# Patient Record
Sex: Female | Born: 1950
Health system: Southern US, Community
[De-identification: ages and names within clinical notes are randomized; demographics above are authoritative.]

## PROBLEM LIST (undated history)

## (undated) DIAGNOSIS — E785 Hyperlipidemia, unspecified: Secondary | ICD-10-CM

## (undated) DIAGNOSIS — Z780 Asymptomatic menopausal state: Secondary | ICD-10-CM

## (undated) DIAGNOSIS — E538 Deficiency of other specified B group vitamins: Secondary | ICD-10-CM

## (undated) HISTORY — DX: Deficiency of other specified B group vitamins: E53.8

## (undated) HISTORY — DX: Hyperlipidemia, unspecified: E78.5

## (undated) HISTORY — PX: COLONOSCOPY: SHX174

## (undated) HISTORY — PX: DILATION AND CURETTAGE OF UTERUS: SHX78

## (undated) HISTORY — PX: ABDOMINAL HYSTERECTOMY: SHX81

## (undated) HISTORY — PX: CERVICAL CONE BIOPSY: SUR198

## (undated) HISTORY — PX: TUBAL LIGATION: SHX77

---

## 1998-10-21 ENCOUNTER — Other Ambulatory Visit: Admission: RE | Admit: 1998-10-21 | Discharge: 1998-10-21 | Payer: Self-pay | Admitting: *Deleted

## 1999-01-10 ENCOUNTER — Other Ambulatory Visit: Admission: RE | Admit: 1999-01-10 | Discharge: 1999-01-10 | Payer: Self-pay | Admitting: *Deleted

## 1999-11-13 ENCOUNTER — Other Ambulatory Visit: Admission: RE | Admit: 1999-11-13 | Discharge: 1999-11-13 | Payer: Self-pay | Admitting: *Deleted

## 2000-10-04 ENCOUNTER — Other Ambulatory Visit: Admission: RE | Admit: 2000-10-04 | Discharge: 2000-10-04 | Payer: Self-pay | Admitting: *Deleted

## 2001-09-26 ENCOUNTER — Other Ambulatory Visit: Admission: RE | Admit: 2001-09-26 | Discharge: 2001-09-26 | Payer: Self-pay | Admitting: *Deleted

## 2002-10-09 ENCOUNTER — Other Ambulatory Visit: Admission: RE | Admit: 2002-10-09 | Discharge: 2002-10-09 | Payer: Self-pay | Admitting: *Deleted

## 2003-10-17 ENCOUNTER — Other Ambulatory Visit: Admission: RE | Admit: 2003-10-17 | Discharge: 2003-10-17 | Payer: Self-pay | Admitting: *Deleted

## 2004-05-28 ENCOUNTER — Ambulatory Visit: Payer: Self-pay | Admitting: Family Medicine

## 2004-07-02 ENCOUNTER — Ambulatory Visit: Payer: Self-pay | Admitting: Internal Medicine

## 2004-07-17 ENCOUNTER — Ambulatory Visit: Payer: Self-pay | Admitting: Internal Medicine

## 2004-07-17 LAB — HM COLONOSCOPY

## 2004-08-05 ENCOUNTER — Ambulatory Visit: Payer: Self-pay | Admitting: Critical Care Medicine

## 2004-08-07 ENCOUNTER — Ambulatory Visit: Payer: Self-pay | Admitting: Critical Care Medicine

## 2004-08-07 ENCOUNTER — Encounter: Admission: RE | Admit: 2004-08-07 | Discharge: 2004-08-07 | Payer: Self-pay | Admitting: Critical Care Medicine

## 2004-09-22 ENCOUNTER — Ambulatory Visit: Payer: Self-pay | Admitting: Critical Care Medicine

## 2004-09-23 ENCOUNTER — Ambulatory Visit: Payer: Self-pay | Admitting: Family Medicine

## 2004-10-30 ENCOUNTER — Other Ambulatory Visit: Admission: RE | Admit: 2004-10-30 | Discharge: 2004-10-30 | Payer: Self-pay | Admitting: *Deleted

## 2005-11-04 ENCOUNTER — Other Ambulatory Visit: Admission: RE | Admit: 2005-11-04 | Discharge: 2005-11-04 | Payer: Self-pay | Admitting: *Deleted

## 2006-03-13 ENCOUNTER — Ambulatory Visit: Payer: Self-pay | Admitting: Family Medicine

## 2006-11-09 ENCOUNTER — Other Ambulatory Visit: Admission: RE | Admit: 2006-11-09 | Discharge: 2006-11-09 | Payer: Self-pay | Admitting: *Deleted

## 2007-12-19 ENCOUNTER — Other Ambulatory Visit: Admission: RE | Admit: 2007-12-19 | Discharge: 2007-12-19 | Payer: Self-pay | Admitting: Gynecology

## 2010-05-28 ENCOUNTER — Ambulatory Visit: Payer: Self-pay

## 2010-09-22 ENCOUNTER — Other Ambulatory Visit: Payer: Self-pay | Admitting: Gynecology

## 2010-09-22 DIAGNOSIS — R928 Other abnormal and inconclusive findings on diagnostic imaging of breast: Secondary | ICD-10-CM

## 2010-09-26 ENCOUNTER — Ambulatory Visit
Admission: RE | Admit: 2010-09-26 | Discharge: 2010-09-26 | Disposition: A | Discharge status: Home / Self Care | Payer: PRIVATE HEALTH INSURANCE | Source: Ambulatory Visit | Attending: Gynecology | Admitting: Gynecology

## 2010-09-26 DIAGNOSIS — R928 Other abnormal and inconclusive findings on diagnostic imaging of breast: Secondary | ICD-10-CM

## 2011-09-21 LAB — HM DEXA SCAN: HM DEXA SCAN: NORMAL

## 2014-04-06 LAB — HM HEPATITIS C SCREENING LAB: HM Hepatitis Screen: NEGATIVE

## 2014-04-06 LAB — LIPID PANEL
CHOLESTEROL: 266 mg/dL — AB (ref 0–200)
HDL: 45 mg/dL (ref 35–70)
LDL CALC: 145 mg/dL
LDL/HDL RATIO: 3.2
TRIGLYCERIDES: 380 mg/dL — AB (ref 40–160)

## 2014-04-12 ENCOUNTER — Other Ambulatory Visit: Payer: Self-pay | Admitting: Gynecology

## 2014-04-16 LAB — CYTOLOGY - PAP

## 2014-04-24 ENCOUNTER — Other Ambulatory Visit: Payer: Self-pay | Admitting: Gynecology

## 2014-04-25 LAB — CYTOLOGY - PAP

## 2014-05-04 ENCOUNTER — Encounter: Payer: Self-pay | Admitting: Internal Medicine

## 2014-11-22 LAB — HEPATIC FUNCTION PANEL
ALT: 12 U/L (ref 7–35)
AST: 10 U/L — AB (ref 13–35)
Alkaline Phosphatase: 119 U/L (ref 25–125)

## 2014-11-22 LAB — CBC AND DIFFERENTIAL
HEMATOCRIT: 38 % (ref 36–46)
Hemoglobin: 12.4 g/dL (ref 12.0–16.0)
NEUTROS ABS: 4 /uL
PLATELETS: 214 10*3/uL (ref 150–399)
WBC: 6.2 10^3/mL

## 2014-11-22 LAB — BASIC METABOLIC PANEL
BUN: 12 mg/dL (ref 4–21)
Creatinine: 0.6 mg/dL (ref 0.5–1.1)
Glucose: 125 mg/dL
Potassium: 4.3 mmol/L (ref 3.4–5.3)
Sodium: 141 mmol/L (ref 137–147)

## 2014-11-22 LAB — TSH: TSH: 3.15 u[IU]/mL (ref 0.41–5.90)

## 2014-12-11 ENCOUNTER — Encounter: Payer: Self-pay | Admitting: Internal Medicine

## 2015-02-26 ENCOUNTER — Other Ambulatory Visit: Payer: Self-pay | Admitting: Family Medicine

## 2015-03-22 ENCOUNTER — Other Ambulatory Visit: Payer: Self-pay | Admitting: Family Medicine

## 2015-03-22 NOTE — Telephone Encounter (Signed)
Do you want me to refill this or is this from another MD? ED

## 2015-04-04 DIAGNOSIS — N809 Endometriosis, unspecified: Secondary | ICD-10-CM

## 2015-04-04 HISTORY — DX: Endometriosis, unspecified: N80.9

## 2015-04-22 ENCOUNTER — Ambulatory Visit (INDEPENDENT_AMBULATORY_CARE_PROVIDER_SITE_OTHER): Payer: 59 | Admitting: Family Medicine

## 2015-04-22 ENCOUNTER — Telehealth: Payer: Self-pay

## 2015-04-22 ENCOUNTER — Encounter: Payer: Self-pay | Admitting: Family Medicine

## 2015-04-22 VITALS — BP 108/62 | HR 86 | Temp 98.8°F | Resp 10 | Wt 124.0 lb

## 2015-04-22 DIAGNOSIS — J0100 Acute maxillary sinusitis, unspecified: Secondary | ICD-10-CM | POA: Diagnosis not present

## 2015-04-22 MED ORDER — AMOXICILLIN-POT CLAVULANATE 875-125 MG PO TABS: 1.0000 | ORAL_TABLET | Freq: Two times a day (BID) | ORAL | Status: DC

## 2015-04-22 NOTE — Progress Notes (Signed)
Patient ID: Tracey Wolf, female   DOB: 12/05/1950, 64 y.o.   MRN: [id number]    Subjective:  HPI  Patient is here with acute issues. She has started to feel bad Wednesday August 14th but was trying to wait it out but over the weekend started to feel worse.  She has been taking Mucinex and Nyquil and used anti-septic spray for her throat.  Prior to Admission medications   Medication Sig Start Date End Date Taking? Authorizing Provider  cyanocobalamin (,VITAMIN B-12,) 1000 MCG/ML injection INJECT 1 MILLILITER SUBCUTANEOUSLY EVERY 5 DAYS 02/27/15  Yes Jerrol Banana., MD  Vitamin D, Ergocalciferol, (DRISDOL) 50000 UNITS CAPS capsule take 1 capsule by mouth every week 03/22/15  Yes Richard Maceo Pro., MD    There are no active problems to display for this patient.   No past medical history on file.  Social History   Social History  . Marital Status: Married    Spouse Name: N/A  . Number of Children: N/A  . Years of Education: N/A   Occupational History  . Not on file.   Social History Main Topics  . Smoking status: Former Smoker -- 0.50 packs/day for 20 years    Types: Cigarettes    Quit date: 08/03/1990  . Smokeless tobacco: Never Used  . Alcohol Use: No  . Drug Use: No  . Sexual Activity: Not on file   Other Topics Concern  . Not on file   Social History Narrative  . No narrative on file    Allergies  Allergen Reactions  . Eggs Or Egg-Derived Products   . Iohexol      Desc: had severe reaction in october '05   swelling, sob and throat closing     Review of Systems  Constitutional: Positive for fever, chills and malaise/fatigue.  HENT: Positive for congestion, ear pain, hearing loss, nosebleeds, sore throat and tinnitus.   Eyes: Negative for blurred vision.  Respiratory: Positive for cough, sputum production and shortness of breath. Negative for wheezing.   Cardiovascular: Positive for chest pain (congestion). Negative for palpitations and leg  swelling.  Gastrointestinal: Positive for nausea. Negative for heartburn, abdominal pain and diarrhea.  Neurological: Positive for weakness and headaches.  Psychiatric/Behavioral: Negative.      There is no immunization history on file for this patient. Objective:  BP 108/62 mmHg  Pulse 86  Temp(Src) 98.8 F (37.1 C)  Resp 10  Wt 124 lb (56.246 kg)  SpO2 95%  Physical Exam  Constitutional: She is oriented to person, place, and time and well-developed, well-nourished, and in no distress.  Thin white female in no acute distress. She obviously however, does not feel well.  HENT:  Head: Normocephalic and atraumatic.  Right Ear: External ear normal.  Left Ear: External ear normal.  Nose: Nose normal.  Mouth/Throat: Oropharynx is clear and moist.  Mild tenderness over right maxillary sinus  Eyes: Conjunctivae are normal.  Neck: Normal range of motion. Neck supple.  Cardiovascular: Normal rate, regular rhythm and normal heart sounds.   Pulmonary/Chest: Effort normal and breath sounds normal.  Abdominal: Soft.  Neurological: She is alert and oriented to person, place, and time. Gait normal.  Skin: Skin is warm and dry.  Psychiatric: Mood, memory, affect and judgment normal.      Assessment and Plan :  Maxillary sinusitis Treat with Augmentin for 10 days with a refill.    Miguel Aschoff MD [facility] 04/22/2015 3:55 PM

## 2015-04-22 NOTE — Telephone Encounter (Signed)
Pt stated that she started having some cold like symptoms with sore throat on Wednesday last week. She said that she has been having a low grade fever with the temps 100.2 on Friday to 99.2 yesterday, she also stated that she has been having some chest pain when breathing, " nothing having a  Heart attack", she stated.  she said that she feels a little light headed from the cold; she stated thad she is NOT having other symptoms like arm pain nor jaw pain and that she knows that if her symptoms declined she knows to go to the ER or call 911. She also stated that she has taken some metamucil OTC with mild relief. She was offered a 10:30 am appointment for this am, but she preferred to come at 03:15 pm this afternoon.  Thanks,

## 2015-04-25 DIAGNOSIS — N809 Endometriosis, unspecified: Secondary | ICD-10-CM | POA: Insufficient documentation

## 2015-04-25 DIAGNOSIS — E785 Hyperlipidemia, unspecified: Secondary | ICD-10-CM | POA: Insufficient documentation

## 2015-04-25 DIAGNOSIS — E538 Deficiency of other specified B group vitamins: Secondary | ICD-10-CM | POA: Insufficient documentation

## 2015-04-25 DIAGNOSIS — Z8742 Personal history of other diseases of the female genital tract: Secondary | ICD-10-CM | POA: Insufficient documentation

## 2015-05-01 ENCOUNTER — Telehealth: Payer: Self-pay | Admitting: Family Medicine

## 2015-05-01 ENCOUNTER — Other Ambulatory Visit: Payer: Self-pay

## 2015-05-01 MED ORDER — AMOXICILLIN-POT CLAVULANATE 875-125 MG PO TABS: 1.0000 | ORAL_TABLET | Freq: Two times a day (BID) | ORAL | Status: DC

## 2015-05-01 NOTE — Telephone Encounter (Signed)
Pt stated she was in last week and is about to run out. Pt stated Monday she was feeling better but today all symptoms have returned. Pt wanted to know if she needs more medication or if she should come back in. [place]. Thanks TNP

## 2015-05-01 NOTE — Telephone Encounter (Signed)
Can give her refill on her last prescription

## 2015-05-01 NOTE — Telephone Encounter (Signed)
Please review, in her note you mentioned given Augmentin with a refill but it was sent in with no refill, is it ok to send in another refill? Thank you-aa

## 2015-06-19 ENCOUNTER — Ambulatory Visit (INDEPENDENT_AMBULATORY_CARE_PROVIDER_SITE_OTHER): Payer: 59 | Admitting: Family Medicine

## 2015-06-19 ENCOUNTER — Encounter: Payer: Self-pay | Admitting: Family Medicine

## 2015-06-19 VITALS — BP 102/60 | HR 68 | Temp 98.2°F | Resp 16 | Ht 67.0 in | Wt 127.0 lb

## 2015-06-19 DIAGNOSIS — Z0000 Encounter for general adult medical examination without abnormal findings: Secondary | ICD-10-CM | POA: Diagnosis not present

## 2015-06-19 DIAGNOSIS — Z1211 Encounter for screening for malignant neoplasm of colon: Secondary | ICD-10-CM | POA: Diagnosis not present

## 2015-06-19 DIAGNOSIS — Z23 Encounter for immunization: Secondary | ICD-10-CM | POA: Diagnosis not present

## 2015-06-19 NOTE — Progress Notes (Signed)
Patient ID: Tracey Wolf, female   DOB: 09-22-1950, 64 y.o.   MRN: [id number] [name], Female    DOB: [birth year], 64 y.o.   MRN: [id number] Visit Date: 06/19/2015  Today's Provider: Wilhemena Durie, MD   Chief Complaint  Patient presents with  . Annual Exam   Subjective:  Tracey Wolf is a 64 y.o. female who presents today for health maintenance and complete physical. She feels well. She reports exercising daily. She reports she is sleeping well.   Review of Systems  Constitutional: Negative.   HENT: Positive for rhinorrhea, sinus pressure and tinnitus.   Eyes: Positive for photophobia.  Respiratory: Negative.   Cardiovascular: Negative.   Gastrointestinal: Negative.   Endocrine: Positive for cold intolerance.  Genitourinary: Negative.   Musculoskeletal: Negative.   Skin: Negative.   Allergic/Immunologic: Positive for food allergies.  Neurological: Negative.   Hematological: Negative.   Psychiatric/Behavioral: Negative.     Social History   Social History  . Marital Status: Married    Spouse Name: N/A  . Number of Children: N/A  . Years of Education: N/A   Occupational History  . Not on file.   Social History Main Topics  . Smoking status: Former Smoker -- 0.50 packs/day for 20 years    Types: Cigarettes    Quit date: 08/03/1990  . Smokeless tobacco: Never Used  . Alcohol Use: 0.0 oz/week    0 Standard drinks or equivalent per week     Comment: 2 per month  . Drug Use: No  . Sexual Activity: Not on file   Other Topics Concern  . Not on file   Social History Narrative    Patient Active Problem List   Diagnosis Date Noted  . Endometriosis 04/25/2015  . Personal history of reproductive and obstetrical problems 04/25/2015  . HLD (hyperlipidemia) 04/25/2015  . B12 deficiency 04/25/2015    Past Surgical History  Procedure Laterality Date  . Abdominal hysterectomy    . Tubal ligation    . Dilation and curettage of uterus       Her family history includes COPD in her father; Diabetes in her mother; Heart attack in her mother; Hyperlipidemia in her brother, daughter, and sister; Pulmonary embolism in her father.    Outpatient Prescriptions Prior to Visit  Medication Sig Dispense Refill  . cyanocobalamin (,VITAMIN B-12,) 1000 MCG/ML injection INJECT 1 MILLILITER SUBCUTANEOUSLY EVERY 5 DAYS 10 mL 12  . EPINEPHrine (EPIPEN 2-PAK) 0.3 mg/0.3 mL IJ SOAJ injection EPIPEN 2-PAK, 0.3MG /0.3ML (Injection Solution Auto-injector)  1 (one) Soln Auto-inj Soln Auto-inj Inject as needed for 0 days  Quantity: 1;  Refills: 12   Ordered :19-Jul-2014  Miguel Aschoff MD Miguel Aschoff MD;  Started [phone] Active Comments: Medication taken as needed.     . Vitamin D, Ergocalciferol, (DRISDOL) 50000 UNITS CAPS capsule take 1 capsule by mouth every week 4 capsule 12  . amoxicillin-clavulanate (AUGMENTIN) 875-125 MG tablet Take 1 tablet by mouth 2 (two) times daily. 20 tablet 0   No facility-administered medications prior to visit.    Patient Care Team: Jerrol Banana., MD as PCP - General (Family Medicine)     Objective:   Vitals:  Filed Vitals:   06/19/15 1002  BP: 102/60  Pulse: 68  Temp: 98.2 F (36.8 C)  TempSrc: Oral  Resp: 16  Height: 5\' 7"  (1.702 m)  Weight: 127 lb (57.607 kg)    Physical Exam  Constitutional: She appears well-developed and well-nourished.  HENT:  Head: Normocephalic and atraumatic.  Right Ear: External ear normal.  Left Ear: External ear normal.  Mouth/Throat: Oropharynx is clear and moist.  Eyes: Conjunctivae and EOM are normal. Pupils are equal, round, and reactive to light.  Neck: Normal range of motion. Neck supple.  Cardiovascular: Normal rate, regular rhythm, normal heart sounds and intact distal pulses.   Pulmonary/Chest: Effort normal and breath sounds normal.  Abdominal: Bowel sounds are normal.  Musculoskeletal: Normal range of motion.  Neurological: She is  alert.  Skin: Skin is warm and dry.  Psychiatric: She has a normal mood and affect. Her behavior is normal. Judgment and thought content normal.     Depression Screen PHQ 2/9 Scores 04/22/2015  PHQ - 2 Score 0      Assessment & Plan:     Routine Health Maintenance and Physical Exam  Exercise Activities and Dietary recommendations Goals    None      Immunization History  Administered Date(s) Administered  . Td 09/17/2004  . Zoster 05/03/2012    Health Maintenance  Topic Date Due  . Hepatitis C Screening  05/17/1951  . HIV Screening  09/13/1965  . COLONOSCOPY  09/13/2000  . MAMMOGRAM  09/26/2012  . TETANUS/TDAP  09/17/2014  . INFLUENZA VACCINE  04/21/2021 (Originally 03/04/2015)  . PAP SMEAR  04/24/2017  . ZOSTAVAX  Completed    GYN exam per Dr. Carren Rang   Discussed health benefits of physical activity, and encouraged her to engage in regular exercise appropriate for her age and condition.    ------------------------------------------------------------------------------------------------------------

## 2015-06-24 ENCOUNTER — Telehealth: Payer: Self-pay | Admitting: Family Medicine

## 2015-06-24 ENCOUNTER — Encounter: Payer: Self-pay | Admitting: Internal Medicine

## 2015-06-24 NOTE — Telephone Encounter (Signed)
Pharmacy told her they need a new RX written for every 4 days. For her B12.  [place]  Call back is [phone]   Hoyt Koch

## 2015-06-25 ENCOUNTER — Other Ambulatory Visit: Payer: Self-pay

## 2015-06-25 MED ORDER — CYANOCOBALAMIN 1000 MCG/ML IJ SOLN: 1000.0000 ug | INTRAMUSCULAR | Status: DC

## 2015-06-27 LAB — CMP14+EGFR
A/G RATIO: 2 (ref 1.1–2.5)
ALK PHOS: 122 IU/L — AB (ref 39–117)
ALT: 7 IU/L (ref 0–32)
AST: 13 IU/L (ref 0–40)
Albumin: 4.6 g/dL (ref 3.6–4.8)
BUN/Creatinine Ratio: 22 (ref 11–26)
BUN: 15 mg/dL (ref 8–27)
Bilirubin Total: 0.5 mg/dL (ref 0.0–1.2)
CO2: 24 mmol/L (ref 18–29)
Calcium: 9.4 mg/dL (ref 8.7–10.3)
Chloride: 101 mmol/L (ref 97–106)
Creatinine, Ser: 0.67 mg/dL (ref 0.57–1.00)
GFR calc Af Amer: 107 mL/min/{1.73_m2} (ref >59)
GFR calc non Af Amer: 93 mL/min/{1.73_m2} (ref >59)
GLOBULIN, TOTAL: 2.3 g/dL (ref 1.5–4.5)
Glucose: 86 mg/dL (ref 65–99)
POTASSIUM: 4.3 mmol/L (ref 3.5–5.2)
SODIUM: 141 mmol/L (ref 136–144)
Total Protein: 6.9 g/dL (ref 6.0–8.5)

## 2015-06-27 LAB — CBC WITH DIFFERENTIAL/PLATELET

## 2015-06-27 LAB — LIPID PANEL WITH LDL/HDL RATIO
Cholesterol, Total: 248 mg/dL — ABNORMAL HIGH (ref 100–199)
HDL: 53 mg/dL (ref >39)
LDL CALC: 154 mg/dL — AB (ref 0–99)
LDL/HDL RATIO: 2.9 ratio (ref 0.0–3.2)
TRIGLYCERIDES: 203 mg/dL — AB (ref 0–149)
VLDL Cholesterol Cal: 41 mg/dL — ABNORMAL HIGH (ref 5–40)

## 2015-06-27 LAB — TSH: TSH: 2.24 u[IU]/mL (ref 0.450–4.500)

## 2015-08-28 ENCOUNTER — Encounter: Payer: PRIVATE HEALTH INSURANCE | Admitting: Internal Medicine

## 2015-10-28 ENCOUNTER — Telehealth: Payer: Self-pay

## 2015-10-28 NOTE — Telephone Encounter (Signed)
lmtcb-need to update date of her mammogram-aa

## 2015-10-31 NOTE — Telephone Encounter (Signed)
Patient called and states she had Pap Smear, Mammogram and BMD at [facility] in [place] in Jan or Feb of 2016

## 2015-11-01 NOTE — Telephone Encounter (Signed)
chart updated-aa

## 2015-11-18 ENCOUNTER — Ambulatory Visit (INDEPENDENT_AMBULATORY_CARE_PROVIDER_SITE_OTHER): Payer: Commercial Managed Care - PPO | Admitting: Family Medicine

## 2015-11-18 VITALS — BP 108/60 | HR 72 | Resp 16 | Wt 124.0 lb

## 2015-11-18 DIAGNOSIS — E538 Deficiency of other specified B group vitamins: Secondary | ICD-10-CM | POA: Diagnosis not present

## 2015-11-18 MED ORDER — CYANOCOBALAMIN 1000 MCG/ML IJ SOLN: 1000.0000 ug | INTRAMUSCULAR | Status: DC

## 2015-11-18 NOTE — Progress Notes (Signed)
Patient ID: Tracey Wolf, female   DOB: June 14, 1951, 65 y.o.   MRN: [id number]   Tracey Wolf  MRN: [id number] DOB: 1950-10-02  Subjective:  HPI   The patient  is a 65 year old female who presents for follow up of her Vtiamin B12 def.  She was last seen on 06/19/15.  Her last B12 level was on 06/26/15 and was greater than 2000 which is normal.  She is currently taking 1000 mcg of Vit B12 IM every 4 days.  She feels she may need to go to every 3 days.  Patient Active Problem List   Diagnosis Date Noted  . Endometriosis 04/25/2015  . Personal history of reproductive and obstetrical problems 04/25/2015  . HLD (hyperlipidemia) 04/25/2015  . B12 deficiency 04/25/2015    No past medical history on file.  Social History   Social History  . Marital Status: Married    Spouse Name: N/A  . Number of Children: N/A  . Years of Education: N/A   Occupational History  . Not on file.   Social History Main Topics  . Smoking status: Former Smoker -- 0.50 packs/day for 20 years    Types: Cigarettes    Quit date: 08/03/1990  . Smokeless tobacco: Never Used  . Alcohol Use: 0.0 oz/week    0 Standard drinks or equivalent per week     Comment: 2 per month  . Drug Use: No  . Sexual Activity: Not on file   Other Topics Concern  . Not on file   Social History Narrative    Outpatient Prescriptions Prior to Visit  Medication Sig Dispense Refill  . cyanocobalamin (,VITAMIN B-12,) 1000 MCG/ML injection Inject 1 mL (1,000 mcg total) into the muscle See admin instructions. Every 4 days 10 mL 12  . EPINEPHrine (EPIPEN 2-PAK) 0.3 mg/0.3 mL IJ SOAJ injection EPIPEN 2-PAK, 0.3MG /0.3ML (Injection Solution Auto-injector)  1 (one) Soln Auto-inj Soln Auto-inj Inject as needed for 0 days  Quantity: 1;  Refills: 12   Ordered :19-Jul-2014  Miguel Aschoff MD Miguel Aschoff MD;  Started [phone] Active Comments: Medication taken as needed.     . Vitamin D, Ergocalciferol, (DRISDOL) 50000  UNITS CAPS capsule take 1 capsule by mouth every week 4 capsule 12   No facility-administered medications prior to visit.    Allergies  Allergen Reactions  . Bee Venom   . Cefaclor   . Eggs Or Egg-Derived Products   . Iodinated Diagnostic Agents   . Iohexol      Desc: had severe reaction in october '05   swelling, sob and throat closing   . Doxycycline Rash    Review of Systems  Constitutional: Positive for malaise/fatigue. Negative for fever.  Respiratory: Negative for cough, shortness of breath and wheezing.   Cardiovascular: Negative for chest pain, palpitations, orthopnea and leg swelling.  Gastrointestinal: Negative.   Neurological: Positive for dizziness. Negative for weakness and headaches.  Psychiatric/Behavioral: Negative.    Objective:  BP 108/60 mmHg  Pulse 72  Resp 16  Wt 124 lb (56.246 kg)  Physical Exam  Constitutional: She is well-developed, well-nourished, and in no distress.  HENT:  Head: Normocephalic and atraumatic.  Right Ear: External ear normal.  Left Ear: External ear normal.  Nose: Nose normal.  Eyes: Conjunctivae are normal. Pupils are equal, round, and reactive to light.  Neck: Normal range of motion. Neck supple.  Cardiovascular: Normal rate, regular rhythm and normal heart sounds.   Pulmonary/Chest: Effort normal and breath sounds normal.  Abdominal: Soft.  Musculoskeletal: She exhibits edema (trace bilat).  Neurological: She is alert. Gait normal.  Skin: Skin is warm and dry.  Psychiatric: Mood, memory, affect and judgment normal.    Assessment and Plan :  1. Vitamin B 12 deficiency Consider referral back to endocrinology. Patient states she feels good for 3 days after each injection gets tired again. This is been slow over the past several years. Today will increase her injections every Sunday and Wednesday and Sunday morning and Wednesday night. - cyanocobalamin (,VITAMIN B-12,) 1000 MCG/ML injection; Inject 1 mL (1,000 mcg total)  into the muscle See admin instructions. Patient to take 1000 mcg on Sunday morning and Wednesday nights.  Dispense: 10 mL; Refill: 12 Return to clinic 3 months. I have done the exam and reviewed the above chart and it is accurate to the best of my knowledge.    Miguel Aschoff MD [facility] 11/18/2015 4:28 PM

## 2016-03-23 ENCOUNTER — Encounter: Payer: Self-pay | Admitting: Family Medicine

## 2016-03-23 ENCOUNTER — Ambulatory Visit (INDEPENDENT_AMBULATORY_CARE_PROVIDER_SITE_OTHER): Payer: Commercial Managed Care - PPO | Admitting: Family Medicine

## 2016-03-23 VITALS — BP 118/60 | HR 60 | Temp 98.6°F | Resp 16 | Wt 124.0 lb

## 2016-03-23 DIAGNOSIS — E538 Deficiency of other specified B group vitamins: Secondary | ICD-10-CM

## 2016-03-23 NOTE — Progress Notes (Signed)
       [name] Female    DOB: 25-Jun-1951   65 y.o.   MRN: [id number] Visit Date: 03/23/2016  Today's Provider: Wilhemena Durie, MD   Chief Complaint  Patient presents with  . vitamin B12 def    3 month follow up.    Subjective:    HPI Patient comes in today for a follow up Vitamin B12. Patient was last seen in the office in 11/2015 and everything was stable.     Allergies  Allergen Reactions  . Bee Venom   . Cefaclor   . Eggs Or Egg-Derived Products   . Iodinated Diagnostic Agents   . Iohexol      Desc: had severe reaction in october '05   swelling, sob and throat closing   . Doxycycline Rash   No outpatient prescriptions have been marked as taking for the 03/23/16 encounter (Office Visit) with Jerrol Banana., MD.    Review of Systems  Constitutional: Negative.   Eyes: Negative.   Respiratory: Negative.   Cardiovascular: Negative.   Gastrointestinal: Negative.   Endocrine: Negative.   Allergic/Immunologic: Negative.   Neurological: Negative.   Hematological: Negative.   Psychiatric/Behavioral: Negative.     Social History  Substance Use Topics  . Smoking status: Former Smoker    Packs/day: 0.50    Years: 20.00    Types: Cigarettes    Quit date: 08/03/1990  . Smokeless tobacco: Never Used  . Alcohol use 0.0 oz/week     Comment: 2 per month   Objective:   BP 118/60   Pulse 60   Temp 98.6 F (37 C)   Resp 16   Wt 124 lb (56.2 kg)   BMI 19.42 kg/m   Physical Exam  Constitutional: She is oriented to person, place, and time. She appears well-developed and well-nourished.  HENT:  Head: Normocephalic and atraumatic.  Neck: Normal range of motion. Neck supple.  Cardiovascular: Normal rate, regular rhythm and normal heart sounds.   Pulmonary/Chest: Effort normal and breath sounds normal.  Neurological: She is alert and oriented to person, place, and time.  Skin: Skin is warm and dry.  Psychiatric: She has a normal mood and  affect. Her behavior is normal. Judgment and thought content normal.        Assessment & Plan:     1. B12 deficiency Stable. Continue Vitamin B12 injections. F/U in 3 months for annual wellness exam.  Overall patient feels very well.       I have done the exam and reviewed the above chart and it is accurate to the best of my knowledge.  Charletta Voight Cranford Mon, MD  [facility]

## 2016-03-29 ENCOUNTER — Other Ambulatory Visit: Payer: Self-pay | Admitting: Family Medicine

## 2016-04-21 LAB — HM COLONOSCOPY

## 2016-05-26 LAB — HM PAP SMEAR: HM Pap smear: NEGATIVE

## 2016-05-26 LAB — HM DEXA SCAN

## 2016-05-26 LAB — HM MAMMOGRAPHY

## 2016-06-21 ENCOUNTER — Ambulatory Visit
Admission: EM | Admit: 2016-06-21 | Discharge: 2016-06-21 | Disposition: A | Discharge status: Home / Self Care | Payer: Commercial Managed Care - PPO | Attending: Family Medicine | Admitting: Family Medicine

## 2016-06-21 DIAGNOSIS — M25532 Pain in left wrist: Secondary | ICD-10-CM | POA: Diagnosis not present

## 2016-06-21 DIAGNOSIS — M25531 Pain in right wrist: Secondary | ICD-10-CM

## 2016-06-21 NOTE — Discharge Instructions (Signed)
Recommend continue Ibuprofen 600mg  every 6 hours as needed for pain and swelling. May apply cool compresses to area for comfort. Follow-up with your PCP for further evaluation (call tomorrow).

## 2016-06-21 NOTE — ED Provider Notes (Signed)
CSN: [id number]     Arrival date & time 06/21/16  [id number] History   First MD Initiated Contact with Patient 06/21/16 1108     Chief Complaint  Patient presents with  . Joint Pain   (Consider location/radiation/quality/duration/timing/severity/associated sxs/prior Treatment) 65 year old female presents with bilateral wrist pain and swelling, right elbow pain, right knee pain and neck pain that started yesterday. No distinct injury or illness. Pain has resolved in most joints today except still some pain bilaterally in both wrists. Has taken Ibuprofen today with some relief. Denies any fever, rash, URI or GI symptoms.    The history is provided by the patient.    History reviewed. No pertinent past medical history. Past Surgical History:  Procedure Laterality Date  . ABDOMINAL HYSTERECTOMY    . DILATION AND CURETTAGE OF UTERUS    . TUBAL LIGATION     Family History  Problem Relation Age of Onset  . Diabetes Mother   . Heart attack Mother   . COPD Father   . Pulmonary embolism Father   . Hyperlipidemia Sister   . Hyperlipidemia Brother   . Hyperlipidemia Daughter    Social History  Substance Use Topics  . Smoking status: Former Smoker    Packs/day: 0.50    Years: 20.00    Types: Cigarettes    Quit date: 08/03/1990  . Smokeless tobacco: Never Used  . Alcohol use 0.0 oz/week     Comment: 2 per month   OB History    No data available     Review of Systems  Constitutional: Positive for fatigue. Negative for activity change, appetite change, chills and fever.  HENT: Negative for congestion.   Eyes: Negative for discharge and visual disturbance.  Respiratory: Negative for cough and shortness of breath.   Cardiovascular: Negative for chest pain and leg swelling.  Gastrointestinal: Negative for abdominal pain, diarrhea, nausea and vomiting.  Genitourinary: Negative for flank pain.  Musculoskeletal: Positive for arthralgias, joint swelling and neck pain. Negative for back pain  and neck stiffness.  Skin: Negative for rash and wound.  Neurological: Negative for dizziness, tremors, syncope, weakness, light-headedness, numbness and headaches.  Hematological: Negative for adenopathy.    Allergies  Bee venom; Cefaclor; Eggs or egg-derived products; Iodinated diagnostic agents; Iohexol; and Doxycycline  Home Medications   Prior to Admission medications   Medication Sig Start Date End Date Taking? Authorizing Provider  cyanocobalamin (,VITAMIN B-12,) 1000 MCG/ML injection Inject 1 mL (1,000 mcg total) into the muscle See admin instructions. Patient to take 1000 mcg on Sunday morning and Wednesday nights. 11/18/15   Richard Maceo Pro., MD  EPINEPHrine (EPIPEN 2-PAK) 0.3 mg/0.3 mL IJ SOAJ injection EPIPEN 2-PAK, 0.3MG /0.3ML (Injection Solution Auto-injector)  1 (one) Soln Auto-inj Soln Auto-inj Inject as needed for 0 days  Quantity: 1;  Refills: 12   Ordered :19-Jul-2014  Miguel Aschoff MD Miguel Aschoff MD;  Started [phone] Active Comments: Medication taken as needed.  07/02/14   Historical Provider, MD  SAFETY-LOK 3CC SYR 25GX5/8" 25G X 5/8" 3 ML MISC use as directed 03/30/16   Jerrol Banana., MD  Vitamin D, Ergocalciferol, (DRISDOL) 50000 units CAPS capsule take 1 capsule by mouth every week 03/30/16   Jerrol Banana., MD   Meds Ordered and Administered this Visit  Medications - No data to display  BP (!) 126/52 (BP Location: Right Arm)   Pulse 62   Temp 97 F (36.1 C) (Oral)   Resp 16   Ht  5\' 7"  (1.702 m)   Wt 124 lb (56.2 kg)   SpO2 100%   BMI 19.42 kg/m  No data found.   Physical Exam  Constitutional: She is oriented to person, place, and time. She appears well-developed and well-nourished. No distress.  HENT:  Head: Normocephalic and atraumatic.  Nose: Nose normal.  Mouth/Throat: Oropharynx is clear and moist.  Neck: Normal range of motion. Neck supple.  Cardiovascular: Normal rate, regular rhythm and normal heart sounds.    Pulmonary/Chest: Effort normal and breath sounds normal. She has no wheezes. She has no rhonchi. She has no rales.  Musculoskeletal: Normal range of motion. She exhibits no tenderness.       Right wrist: She exhibits swelling. She exhibits normal range of motion, no tenderness, no effusion, no crepitus and no deformity.       Left wrist: She exhibits swelling. She exhibits normal range of motion, no tenderness, no effusion, no crepitus and no deformity.  Has full range of motion of wrists with no pain. Slight swelling of medial area of both wrist joints. Non-tender. Good pulses and capillary refill. No neuro deficits noted.   Lymphadenopathy:    She has no cervical adenopathy.  Neurological: She is alert and oriented to person, place, and time. She has normal strength. No sensory deficit.  Skin: Skin is warm, dry and intact. Capillary refill takes less than 2 seconds. No rash noted.  Psychiatric: She has a normal mood and affect. Her behavior is normal.    Urgent Care Course   Clinical Course     Procedures (including critical care time)  Labs Review Labs Reviewed - No data to display  Imaging Review No results found.   Visual Acuity Review  Right Eye Distance:   Left Eye Distance:   Bilateral Distance:    Right Eye Near:   Left Eye Near:    Bilateral Near:         MDM   1. Pain of both wrist joints    Discussed that uncertain of etiology of joint pain and swelling- may be a viral cause. Recommend continue Ibuprofen 600mg  every 6 hours as needed for pain and swelling. Encouraged to follow-up with her primary care provider in 2 to 3 days if symptoms persist for further evaluation and labwork.     Katy Apo, NP 06/22/16 [phone]

## 2016-06-21 NOTE — ED Triage Notes (Signed)
Pt with sudden onset yesterday of joint pain/swelling to "all joints" and neck.  Denies recent illenss. Pain is somewhat better today but swelling remains to wrists. Dull ache 2/10.

## 2016-06-22 ENCOUNTER — Encounter: Payer: Self-pay | Admitting: Physician Assistant

## 2016-06-22 ENCOUNTER — Ambulatory Visit (INDEPENDENT_AMBULATORY_CARE_PROVIDER_SITE_OTHER): Payer: Commercial Managed Care - PPO | Admitting: Physician Assistant

## 2016-06-22 VITALS — BP 118/68 | HR 80 | Temp 97.4°F | Resp 14 | Wt 131.0 lb

## 2016-06-22 DIAGNOSIS — R5383 Other fatigue: Secondary | ICD-10-CM | POA: Diagnosis not present

## 2016-06-22 DIAGNOSIS — M2540 Effusion, unspecified joint: Secondary | ICD-10-CM | POA: Diagnosis not present

## 2016-06-22 NOTE — Patient Instructions (Signed)
Arthritis Introduction Arthritis means joint pain. It can also mean joint disease. A joint is a place where bones come together. People who have arthritis may have:  Red joints.  Swollen joints.  Stiff joints.  Warm joints.  A fever.  A feeling of being sick. Follow these instructions at home: Pay attention to any changes in your symptoms. Take these actions to help with your pain and swelling. Medicines  Take over-the-counter and prescription medicines only as told by your doctor.  Do not take aspirin for pain if your doctor says that you may have gout. Activity  Rest your joint if your doctor tells you to.  Avoid activities that make the pain worse.  Exercise your joint regularly as told by your doctor. Try doing exercises like:  Swimming.  Water aerobics.  Biking.  Walking. Joint Care   If your joint is swollen, keep it raised (elevated) if told by your doctor.  If your joint feels stiff in the morning, try taking a warm shower.  If you have diabetes, do not apply heat without asking your doctor.  If told, apply heat to the joint:  Put a towel between the joint and the hot pack or heating pad.  Leave the heat on the area for 20-30 minutes.  If told, apply ice to the joint:  Put ice in a plastic bag.  Place a towel between your skin and the bag.  Leave the ice on for 20 minutes, 2-3 times per day.  Keep all follow-up visits as told by your doctor. Contact a doctor if:  The pain gets worse.  You have a fever. Get help right away if:  You have very bad pain in your joint.  You have swelling in your joint.  Your joint is red.  Many joints become painful and swollen.  You have very bad back pain.  Your leg is very weak.  You cannot control your pee (urine) or poop (stool). This information is not intended to replace advice given to you by your health care provider. Make sure you discuss any questions you have with your health care  provider. Document Released: 10/14/2009 Document Revised: 12/26/2015 Document Reviewed: 10/15/2014  2017 Elsevier  

## 2016-06-22 NOTE — Progress Notes (Signed)
[name] Female    DOB: October 15, 1950   65 y.o.   MRN: [id number] Visit Date: 06/22/2016  Today's Provider: Trinna Post, PA-C   Chief Complaint  Patient presents with  . Joint Pain   Subjective:    HPI Pt is here today as a follow up from the urgent care. She reports that 2 days ago she suddenly started having joint pain and swelling in her wrist, then went to elbows, back, hips, jaw, neck, and knees. She denies any cold symptoms or fever but she did feel like she had a fever but did not have a fever when she checked it. Pt reports that urgent care told her it was viral infection and to follow up with her PCP for further work up. Pt reports that today the pain is better but she still has swelling in her wrist and is tired from all the pain. She has not had a recent tick bite, but she did have one in September or October. Denies fever. Reports decreased appetite. She reports that her symptoms have mostly resolved except for joint swelling in her wrists. She reports she feels like she has no energy, as if she were just getting over something. Comparatively, she does feel better than when symptoms initially started.    Allergies  Allergen Reactions  . Bee Venom   . Cefaclor   . Eggs Or Egg-Derived Products   . Iodinated Diagnostic Agents   . Iohexol      Desc: had severe reaction in october '05   swelling, sob and throat closing   . Doxycycline Rash     Current Outpatient Prescriptions:  .  cyanocobalamin (,VITAMIN B-12,) 1000 MCG/ML injection, Inject 1 mL (1,000 mcg total) into the muscle See admin instructions. Patient to take 1000 mcg on Sunday morning and Wednesday nights., Disp: 10 mL, Rfl: 12 .  EPINEPHrine (EPIPEN 2-PAK) 0.3 mg/0.3 mL IJ SOAJ injection, EPIPEN 2-PAK, 0.3MG /0.3ML (Injection Solution Auto-injector)  1 (one) Soln Auto-inj Soln Auto-inj Inject as needed for 0 days  Quantity: 1;  Refills: 12   Ordered :19-Jul-2014  Miguel Aschoff MD  Miguel Aschoff MD;  Started [phone] Active Comments: Medication taken as needed. , Disp: , Rfl:  .  SAFETY-LOK 3CC SYR 25GX5/8" 25G X 5/8" 3 ML MISC, use as directed, Disp: 50 each, Rfl: 11 .  Vitamin D, Ergocalciferol, (DRISDOL) 50000 units CAPS capsule, take 1 capsule by mouth every week, Disp: 4 capsule, Rfl: 12  Review of Systems  Constitutional: Negative.   HENT: Negative.   Eyes: Negative.   Respiratory: Negative.   Cardiovascular: Negative.   Gastrointestinal: Negative.   Endocrine: Negative.   Genitourinary: Negative.   Musculoskeletal: Positive for arthralgias and joint swelling.  Skin: Negative.   Allergic/Immunologic: Negative.   Neurological: Negative.   Hematological: Negative.   Psychiatric/Behavioral: Negative.     Social History  Substance Use Topics  . Smoking status: Former Smoker    Packs/day: 0.50    Years: 20.00    Types: Cigarettes    Quit date: 08/03/1990  . Smokeless tobacco: Never Used  . Alcohol use 0.0 oz/week     Comment: 2 per month   Objective:   BP 118/68 (BP Location: Left Arm, Patient Position: Sitting, Cuff Size: Normal)   Pulse 80   Temp 97.4 F (36.3 C) (Oral)   Resp 14   Wt 131 lb (59.4 kg)   BMI 20.52 kg/m   Physical Exam  Constitutional: She is oriented to person, place, and time. She appears well-developed and well-nourished.  Cardiovascular: Normal rate, regular rhythm, normal heart sounds and intact distal pulses.   Pulmonary/Chest: Effort normal.  Musculoskeletal: Normal range of motion. She exhibits edema. She exhibits no tenderness.  There is some mild swelling and erythema over the medial aspects of the wrist. No pus or fluctuance. Full ROM in bilateral upper extremities. Radial pulses 2+. Cap refill < 3s   Neurological: She is alert and oriented to person, place, and time. She has normal strength. No sensory deficit.  Skin: Skin is warm and dry. Rash noted. There is erythema.  There is some redness over her bilateral  shins.         Assessment & Plan:      Problem List Items Addressed This Visit    None    Visit Diagnoses    Joint swelling    -  Primary   Relevant Orders   CBC with Differential   Comprehensive Metabolic Panel (CMET)   Lyme Disease, IgM, Early Test w/ Rflx   Fatigue, unspecified type       Relevant Orders   CBC with Differential   Comprehensive Metabolic Panel (CMET)   Lyme Disease, IgM, Early Test w/ Rflx     Patient is 65 y/o presenting with transient joint swelling. Will evaluate and treat as above. Hx of tick bite, but no history of erythema migrans. Will get Lyme IgM. Patient does report improvement since onset of symptoms. Not quite long enough duration or typical joints for rheumatoid arthritis, but may consider rheumatology referral if symptoms persist.  The entirety of the information documented in the History of Present Illness, Review of Systems and Physical Exam were personally obtained by me. Portions of this information were initially documented by [place] and reviewed by me for thoroughness and accuracy.   No Follow-up on file.  There are no Patient Instructions on file for this visit.          Trinna Post, PA-C  [facility]

## 2016-06-23 LAB — CBC WITH DIFFERENTIAL/PLATELET
Basophils Absolute: 0 10*3/uL (ref 0.0–0.2)
Basos: 1 %
EOS (ABSOLUTE): 0.1 10*3/uL (ref 0.0–0.4)
Eos: 2 %
Hematocrit: 32.2 % — ABNORMAL LOW (ref 34.0–46.6)
Hemoglobin: 10.9 g/dL — ABNORMAL LOW (ref 11.1–15.9)
Immature Grans (Abs): 0 10*3/uL (ref 0.0–0.1)
Immature Granulocytes: 0 %
Lymphocytes Absolute: 0.9 10*3/uL (ref 0.7–3.1)
Lymphs: 23 %
MCH: 28.9 pg (ref 26.6–33.0)
MCHC: 33.9 g/dL (ref 31.5–35.7)
MCV: 85 fL (ref 79–97)
Monocytes Absolute: 0.2 10*3/uL (ref 0.1–0.9)
Monocytes: 6 %
Neutrophils Absolute: 2.6 10*3/uL (ref 1.4–7.0)
Neutrophils: 68 %
Platelets: 229 10*3/uL (ref 150–379)
RBC: 3.77 x10E6/uL (ref 3.77–5.28)
RDW: 14.3 % (ref 12.3–15.4)
WBC: 3.9 10*3/uL (ref 3.4–10.8)

## 2016-06-23 LAB — COMPREHENSIVE METABOLIC PANEL
ALT: 9 IU/L (ref 0–32)
AST: 14 IU/L (ref 0–40)
Albumin/Globulin Ratio: 1.7 (ref 1.2–2.2)
Albumin: 3.9 g/dL (ref 3.6–4.8)
Alkaline Phosphatase: 164 IU/L — ABNORMAL HIGH (ref 39–117)
BUN/Creatinine Ratio: 14 (ref 12–28)
BUN: 8 mg/dL (ref 8–27)
Bilirubin Total: 0.3 mg/dL (ref 0.0–1.2)
CO2: 27 mmol/L (ref 18–29)
Calcium: 8.2 mg/dL — ABNORMAL LOW (ref 8.7–10.3)
Chloride: 103 mmol/L (ref 96–106)
Creatinine, Ser: 0.56 mg/dL — ABNORMAL LOW (ref 0.57–1.00)
GFR calc Af Amer: 113 mL/min/{1.73_m2} (ref >59)
GFR calc non Af Amer: 98 mL/min/{1.73_m2} (ref >59)
Globulin, Total: 2.3 g/dL (ref 1.5–4.5)
Glucose: 95 mg/dL (ref 65–99)
Potassium: 4.8 mmol/L (ref 3.5–5.2)
Sodium: 141 mmol/L (ref 134–144)
Total Protein: 6.2 g/dL (ref 6.0–8.5)

## 2016-06-23 LAB — LYME, IGM, EARLY TEST/REFLEX: LYME DISEASE AB, QUANT, IGM: <0.8 index (ref 0.00–0.79)

## 2016-06-29 ENCOUNTER — Telehealth: Payer: Self-pay

## 2016-06-29 ENCOUNTER — Other Ambulatory Visit: Payer: Self-pay | Admitting: Physician Assistant

## 2016-06-29 ENCOUNTER — Telehealth: Payer: Self-pay | Admitting: Physician Assistant

## 2016-06-29 NOTE — Telephone Encounter (Signed)
-----   Message from Trinna Post, [place] sent at 06/29/2016  3:17 PM EST ----- Lyme titer normal. Slightly anemic on CBC, patient with history of vitamin B12. CMP with increased alkaline phosphatase and low calcium. Spoke with Dr. Rosanna Randy, will repeat CMP in 2 months to confirm these values. Will order this lab.

## 2016-06-29 NOTE — Telephone Encounter (Signed)
Please review over results and advise. KW

## 2016-06-29 NOTE — Telephone Encounter (Signed)
LMTCB-KW 

## 2016-06-29 NOTE — Progress Notes (Signed)
LMTCB-KW 

## 2016-06-29 NOTE — Telephone Encounter (Signed)
LMTCB 06/29/2016  Thanks,   -Mickel Baas

## 2016-06-29 NOTE — Progress Notes (Signed)
Have ordered CMP for 2 months from now to recheck calcium and alkaline phosphatase.

## 2016-06-29 NOTE — Telephone Encounter (Signed)
Pt stated she had labs done on 06/22/16 that [place] ordered. Pt would like a call back to get the results. Please advise. Thanks TNP

## 2016-06-30 NOTE — Telephone Encounter (Signed)
Patient has been advised. KW 

## 2016-07-23 ENCOUNTER — Encounter: Payer: Self-pay | Admitting: Family Medicine

## 2016-07-23 ENCOUNTER — Ambulatory Visit (INDEPENDENT_AMBULATORY_CARE_PROVIDER_SITE_OTHER): Payer: Commercial Managed Care - PPO | Admitting: Family Medicine

## 2016-07-23 VITALS — BP 108/58 | Temp 98.5°F | Resp 16 | Wt 130.0 lb

## 2016-07-23 DIAGNOSIS — E559 Vitamin D deficiency, unspecified: Secondary | ICD-10-CM | POA: Diagnosis not present

## 2016-07-23 DIAGNOSIS — M2550 Pain in unspecified joint: Secondary | ICD-10-CM | POA: Diagnosis not present

## 2016-07-23 DIAGNOSIS — E538 Deficiency of other specified B group vitamins: Secondary | ICD-10-CM

## 2016-07-23 MED ORDER — CELECOXIB 200 MG PO CAPS: 200.0000 mg | ORAL_CAPSULE | Freq: Every day | ORAL | 1 refills | Status: DC

## 2016-07-23 NOTE — Patient Instructions (Addendum)
Do not take Ibuprofen along with Celebrex. Take with food.  May take Tylenol.  Follow up in one month if not improving.

## 2016-07-23 NOTE — Progress Notes (Signed)
[name] Female    DOB: [birth year]   65 y.o.   MRN: [id number] Visit Date: 07/23/2016  Today's Provider: Wilhemena Durie, MD   Chief Complaint  Patient presents with  . vitamin d deficiency  . vitamin B12 deficency   Subjective:    HPI Patient comes in today for a follow up on Vitamin D deficiency. Patient reports that she is still currently taking Vit D 50,000units once weekly, and has been tolerating well.   Patient also comes in today for a follow up on Vitamin B12 deficiency. Patient was last seen in the office for this in 03/2016 and everything was stable. Patient still does her injections twice weekly.   Patient was also seen in the office about 1 month ago c/o joint swelling and fatigue. All blood tests came back essentially normal. However, patient reports that she still has the swelling in her lower extremities. Patient reports that her legs are sometimes painful.   the joint swelling has resolved but the discomfort and mild diffuse feeling of swelling in her limbs persists. This started in the wrist and elbows and then went into some the larger joints. Again, now markedly improved but persistent.  Allergies  Allergen Reactions  . Bee Venom   . Cefaclor   . Eggs Or Egg-Derived Products   . Iodinated Diagnostic Agents   . Iohexol      Desc: had severe reaction in october '05   swelling, sob and throat closing   . Doxycycline Rash     Current Outpatient Prescriptions:  .  cyanocobalamin (,VITAMIN B-12,) 1000 MCG/ML injection, Inject 1 mL (1,000 mcg total) into the muscle See admin instructions. Patient to take 1000 mcg on Sunday morning and Wednesday nights., Disp: 10 mL, Rfl: 12 .  EPINEPHrine (EPIPEN 2-PAK) 0.3 mg/0.3 mL IJ SOAJ injection, EPIPEN 2-PAK, 0.3MG/0.3ML (Injection Solution Auto-injector)  1 (one) Soln Auto-inj Soln Auto-inj Inject as needed for 0 days  Quantity: 1;  Refills: 12   Ordered :19-Jul-2014  Miguel Aschoff MD Miguel Aschoff MD;  Started [phone] Active Comments: Medication taken as needed. , Disp: , Rfl:  .  SAFETY-LOK 3CC SYR 25GX5/8" 25G X 5/8" 3 ML MISC, use as directed, Disp: 50 each, Rfl: 11 .  Vitamin D, Ergocalciferol, (DRISDOL) 50000 units CAPS capsule, take 1 capsule by mouth every week, Disp: 4 capsule, Rfl: 12  Review of Systems  Constitutional: Positive for activity change and fatigue.  Respiratory: Negative.   Cardiovascular: Positive for leg swelling. Negative for chest pain and palpitations.  Musculoskeletal: Positive for arthralgias, joint swelling and myalgias.  Neurological: Negative.   Hematological: Negative for adenopathy. Does not bruise/bleed easily.    Social History  Substance Use Topics  . Smoking status: Former Smoker    Packs/day: 0.50    Years: 20.00    Types: Cigarettes    Quit date: 08/03/1990  . Smokeless tobacco: Never Used  . Alcohol use 0.0 oz/week     Comment: 2 per month   Objective:   BP (!) 108/58 (BP Location: Right Arm, Patient Position: Sitting, Cuff Size: Normal)   Temp 98.5 F (36.9 C)   Resp 16   Wt 130 lb (59 kg)   BMI 20.36 kg/m   Physical Exam  Constitutional: She is oriented to person, place, and time. She appears well-developed and well-nourished.  HENT:  Head: Normocephalic and atraumatic.  Eyes: Conjunctivae are normal. No scleral icterus.  Neck: Normal range of  motion. Neck supple. No thyromegaly present.  Cardiovascular: Normal rate, regular rhythm and normal heart sounds.   Pulmonary/Chest: Effort normal and breath sounds normal.  Abdominal: Soft.  Musculoskeletal: She exhibits edema and tenderness.  Neurological: She is alert and oriented to person, place, and time.  Skin: Skin is warm and dry.  Psychiatric: She has a normal mood and affect. Her behavior is normal. Judgment and thought content normal.        Assessment & Plan:     1. B12 deficiency  - CBC with Differential/Platelet - Vitamin B12  2. Vitamin D  deficiency Make sure level is acceptable. Would like to get level above 30-50. - VITAMIN D 25 Hydroxy (Vit-D Deficiency, Fractures)  3. Arthralgia, unspecified joint It is possible that this is rheumatoid arthritis or some autoimmune disorder. Obtain lab data today and go and refer to rheumatology. - Antinuclear Antib (ANA) - Rheumatoid Arthritis Profile - TSH - Sed Rate (ESR) 4. Chronic fatigue This is been much improved with the routine B12 shots but they have to be very frequent. This is been exacerbated again by the recent arthralgia issues.    I have done the exam and reviewed the above chart and it is accurate to the best of my knowledge. Development worker, community has been used in this note in any air is in the dictation or transcription are unintentional.  Wilhemena Durie, MD  [place]

## 2016-07-25 LAB — CBC WITH DIFFERENTIAL/PLATELET
BASOS ABS: 0 10*3/uL (ref 0.0–0.2)
Basos: 1 %
EOS (ABSOLUTE): 0.3 10*3/uL (ref 0.0–0.4)
Eos: 5 %
HEMATOCRIT: 33.9 % — AB (ref 34.0–46.6)
HEMOGLOBIN: 10.5 g/dL — AB (ref 11.1–15.9)
Immature Grans (Abs): 0 10*3/uL (ref 0.0–0.1)
Immature Granulocytes: 0 %
LYMPHS ABS: 1.7 10*3/uL (ref 0.7–3.1)
Lymphs: 30 %
MCH: 27.3 pg (ref 26.6–33.0)
MCHC: 31 g/dL — AB (ref 31.5–35.7)
MCV: 88 fL (ref 79–97)
MONOCYTES: 7 %
MONOS ABS: 0.4 10*3/uL (ref 0.1–0.9)
NEUTROS ABS: 3.3 10*3/uL (ref 1.4–7.0)
Neutrophils: 57 %
Platelets: 235 10*3/uL (ref 150–379)
RBC: 3.84 x10E6/uL (ref 3.77–5.28)
RDW: 15.5 % — ABNORMAL HIGH (ref 12.3–15.4)
WBC: 5.6 10*3/uL (ref 3.4–10.8)

## 2016-07-25 LAB — SEDIMENTATION RATE: SED RATE: 9 mm/h (ref 0–40)

## 2016-07-25 LAB — RHEUMATOID ARTHRITIS PROFILE
Cyclic Citrullin Peptide Ab: 10 units (ref 0–19)
Rhuematoid fact SerPl-aCnc: <10 IU/mL (ref 0.0–13.9)

## 2016-07-25 LAB — ANA: Anti Nuclear Antibody(ANA): NEGATIVE

## 2016-07-25 LAB — VITAMIN B12: VITAMIN B 12: 1782 pg/mL — AB (ref 232–1245)

## 2016-07-25 LAB — VITAMIN D 25 HYDROXY (VIT D DEFICIENCY, FRACTURES): VIT D 25 HYDROXY: 39.9 ng/mL (ref 30.0–100.0)

## 2016-07-25 LAB — TSH: TSH: 3.27 u[IU]/mL (ref 0.450–4.500)

## 2016-07-28 ENCOUNTER — Other Ambulatory Visit: Payer: Self-pay

## 2016-07-28 DIAGNOSIS — M2550 Pain in unspecified joint: Secondary | ICD-10-CM

## 2016-07-28 DIAGNOSIS — M791 Myalgia, unspecified site: Secondary | ICD-10-CM

## 2016-07-28 NOTE — Progress Notes (Signed)
Advised and referral ordered.  ED

## 2016-08-12 DIAGNOSIS — M2550 Pain in unspecified joint: Secondary | ICD-10-CM | POA: Insufficient documentation

## 2016-08-18 ENCOUNTER — Encounter: Payer: Self-pay | Admitting: Physician Assistant

## 2016-08-18 ENCOUNTER — Ambulatory Visit
Admission: RE | Admit: 2016-08-18 | Discharge: 2016-08-18 | Disposition: A | Discharge status: Home / Self Care | Payer: Commercial Managed Care - PPO | Source: Ambulatory Visit | Attending: Physician Assistant | Admitting: Physician Assistant

## 2016-08-18 ENCOUNTER — Ambulatory Visit (INDEPENDENT_AMBULATORY_CARE_PROVIDER_SITE_OTHER): Payer: Commercial Managed Care - PPO | Admitting: Physician Assistant

## 2016-08-18 ENCOUNTER — Other Ambulatory Visit: Payer: Self-pay | Admitting: Physician Assistant

## 2016-08-18 VITALS — BP 112/62 | HR 88 | Temp 98.8°F | Resp 16 | Wt 124.0 lb

## 2016-08-18 DIAGNOSIS — R059 Cough, unspecified: Secondary | ICD-10-CM

## 2016-08-18 DIAGNOSIS — R6889 Other general symptoms and signs: Secondary | ICD-10-CM

## 2016-08-18 DIAGNOSIS — R509 Fever, unspecified: Secondary | ICD-10-CM | POA: Diagnosis not present

## 2016-08-18 DIAGNOSIS — R05 Cough: Secondary | ICD-10-CM

## 2016-08-18 MED ORDER — OSELTAMIVIR PHOSPHATE 75 MG PO CAPS: 75.0000 mg | ORAL_CAPSULE | Freq: Two times a day (BID) | ORAL | 0 refills | Status: AC

## 2016-08-18 NOTE — Progress Notes (Signed)
[place]  Chief Complaint  Patient presents with  . URI    Started Saturday    Subjective:    Patient ID: Tracey Wolf, female    DOB: January 21, 1951, 67 y.o.   MRN: [id number]  Upper Respiratory Infection: Tracey Wolf is a 66 y.o. female complaining of symptoms of a URI. Symptoms include congestion, cough, fever and sore throat. Onset of symptoms was 3 days ago, unchanged since that time. She also c/o achiness, congestion, cough described as productive, lightheadedness and nasal congestion for the past 3 days .  She is drinking plenty of fluids. Her husband was evaluated last week for similar symptoms. She did not get her flu shot this year 2/2 egg allergy. Evaluation to date: none. Treatment to date: cough suppressants and decongestants. The treatment has provided minimal relief.   Review of Systems  Constitutional: Positive for chills, fatigue and fever (Temperature ranges from 99.4 - 101). Negative for appetite change.  HENT: Positive for congestion, postnasal drip, rhinorrhea, sinus pain, sinus pressure, sneezing, sore throat and voice change. Negative for ear discharge, ear pain, nosebleeds and trouble swallowing.   Eyes: Positive for photophobia, discharge and itching.  Respiratory: Positive for cough, chest tightness and wheezing. Negative for apnea, choking, shortness of breath and stridor.   Gastrointestinal: Positive for diarrhea and nausea. Negative for abdominal distention, abdominal pain, anal bleeding, blood in stool, constipation, rectal pain and vomiting.  Musculoskeletal: Positive for myalgias.  Neurological: Positive for weakness, light-headedness and headaches.       Objective:   BP 112/62 (BP Location: Left Arm, Patient Position: Sitting, Cuff Size: Normal)   Pulse 88   Temp 98.8 F (37.1 C) (Oral)   Resp 16   Wt 124 lb (56.2 kg)   BMI 19.42 kg/m   Patient Active Problem List   Diagnosis Date Noted  .  Endometriosis 04/25/2015  . Personal history of reproductive and obstetrical problems 04/25/2015  . HLD (hyperlipidemia) 04/25/2015  . B12 deficiency 04/25/2015    Outpatient Encounter Prescriptions as of 08/18/2016  Medication Sig Note  . celecoxib (CELEBREX) 200 MG capsule Take 1 capsule (200 mg total) by mouth daily.   . cyanocobalamin (,VITAMIN B-12,) 1000 MCG/ML injection Inject 1 mL (1,000 mcg total) into the muscle See admin instructions. Patient to take 1000 mcg on Sunday morning and Wednesday nights.   Marland Kitchen EPINEPHrine (EPIPEN 2-PAK) 0.3 mg/0.3 mL IJ SOAJ injection EPIPEN 2-PAK, 0.3MG /0.3ML (Injection Solution Auto-injector)  1 (one) Soln Auto-inj Soln Auto-inj Inject as needed for 0 days  Quantity: 1;  Refills: 12   Ordered :19-Jul-2014  Miguel Aschoff MD Miguel Aschoff MD;  Started [phone] Active Comments: Medication taken as needed.  04/25/2015: Medication taken as needed.  Received from: [facility]  . SAFETY-LOK 3CC SYR 25GX5/8" 25G X 5/8" 3 ML MISC use as directed   . Vitamin D, Ergocalciferol, (DRISDOL) 50000 units CAPS capsule take 1 capsule by mouth every week    No facility-administered encounter medications on file as of 08/18/2016.     Allergies  Allergen Reactions  . Bee Venom   . Cefaclor   . Eggs Or Egg-Derived Products   . Iodinated Diagnostic Agents   . Iohexol      Desc: had severe reaction in october '05   swelling, sob and throat closing   . Doxycycline Rash       Physical Exam  Constitutional: She is oriented to person, place, and time. She appears well-developed. She  appears ill. No distress.  Patient sitting in office chair, looks tired.  HENT:  Right Ear: Tympanic membrane normal.  Left Ear: Tympanic membrane normal.  Mouth/Throat: Posterior oropharyngeal erythema present. No posterior oropharyngeal edema.  Eyes: Conjunctivae are normal.  Neck: Neck supple.  Cardiovascular: Normal rate and regular rhythm.    Pulmonary/Chest: Effort normal. No respiratory distress. She has no wheezes. She has rales in the right lower field.  Lymphadenopathy:    She has no cervical adenopathy.  Neurological: She is alert and oriented to person, place, and time.  Skin: Skin is warm. She is not diaphoretic.  Skin is clammy  Psychiatric: She has a normal mood and affect. Her behavior is normal.       Assessment & Plan:   1. Cough  Will evaluate as below for infectious pulmonary process, but without evidence of Pneumonia on xray,will treat for flu since patient has not had flu shot. We are out of swabs today. CXR was clear of pneumonia, did show some possible bronchitis, which is normally viral. Will treat for influenza with Tamiflu 75 mg BID x 5 days.  - DG Chest 2 View; Future  2. Fever, unspecified fever cause  See above.   - DG Chest 2 View; Future  Return if symptoms worsen or fail to improve.   Patient Instructions  Upper Respiratory Infection, Adult Most upper respiratory infections (URIs) are caused by a virus. A URI affects the nose, throat, and upper air passages. The most common type of URI is often called "the common cold." Follow these instructions at home:  Take medicines only as told by your doctor.  Gargle warm saltwater or take cough drops to comfort your throat as told by your doctor.  Use a warm mist humidifier or inhale steam from a shower to increase air moisture. This may make it easier to breathe.  Drink enough fluid to keep your pee (urine) clear or pale yellow.  Eat soups and other clear broths.  Have a healthy diet.  Rest as needed.  Go back to work when your fever is gone or your doctor says it is okay.  You may need to stay home longer to avoid giving your URI to others.  You can also wear a face mask and wash your hands often to prevent spread of the virus.  Use your inhaler more if you have asthma.  Do not use any tobacco products, including cigarettes,  chewing tobacco, or electronic cigarettes. If you need help quitting, ask your doctor. Contact a doctor if:  You are getting worse, not better.  Your symptoms are not helped by medicine.  You have chills.  You are getting more short of breath.  You have brown or red mucus.  You have yellow or brown discharge from your nose.  You have pain in your face, especially when you bend forward.  You have a fever.  You have puffy (swollen) neck glands.  You have pain while swallowing.  You have white areas in the back of your throat. Get help right away if:  You have very bad or constant:  Headache.  Ear pain.  Pain in your forehead, behind your eyes, and over your cheekbones (sinus pain).  Chest pain.  You have long-lasting (chronic) lung disease and any of the following:  Wheezing.  Long-lasting cough.  Coughing up blood.  A change in your usual mucus.  You have a stiff neck.  You have changes in your:  Vision.  Hearing.  Thinking.  Mood. This information is not intended to replace advice given to you by your health care provider. Make sure you discuss any questions you have with your health care provider. Document Released: 01/06/2008 Document Revised: 03/22/2016 Document Reviewed: 10/25/2013 Elsevier Interactive Patient Education  2017 [employer].     The entirety of the information documented in the History of Present Illness, Review of Systems and Physical Exam were personally obtained by me. Portions of this information were initially documented by Ashley Royalty, CMA and reviewed by me for thoroughness and accuracy.

## 2016-08-18 NOTE — Progress Notes (Signed)
Advised patient as below.  

## 2016-08-18 NOTE — Progress Notes (Signed)
CXR did not show pneumonia. Did show some possible bronchitis, which is normally viral so no antibiotic is indicated at this time. Will send in Tamiflu for treatment. One pill twice daily for five days.

## 2016-08-18 NOTE — Patient Instructions (Signed)
Upper Respiratory Infection, Adult Most upper respiratory infections (URIs) are caused by a virus. A URI affects the nose, throat, and upper air passages. The most common type of URI is often called "the common cold." Follow these instructions at home:  Take medicines only as told by your doctor.  Gargle warm saltwater or take cough drops to comfort your throat as told by your doctor.  Use a warm mist humidifier or inhale steam from a shower to increase air moisture. This may make it easier to breathe.  Drink enough fluid to keep your pee (urine) clear or pale yellow.  Eat soups and other clear broths.  Have a healthy diet.  Rest as needed.  Go back to work when your fever is gone or your doctor says it is okay.  You may need to stay home longer to avoid giving your URI to others.  You can also wear a face mask and wash your hands often to prevent spread of the virus.  Use your inhaler more if you have asthma.  Do not use any tobacco products, including cigarettes, chewing tobacco, or electronic cigarettes. If you need help quitting, ask your doctor. Contact a doctor if:  You are getting worse, not better.  Your symptoms are not helped by medicine.  You have chills.  You are getting more short of breath.  You have brown or red mucus.  You have yellow or brown discharge from your nose.  You have pain in your face, especially when you bend forward.  You have a fever.  You have puffy (swollen) neck glands.  You have pain while swallowing.  You have white areas in the back of your throat. Get help right away if:  You have very bad or constant:  Headache.  Ear pain.  Pain in your forehead, behind your eyes, and over your cheekbones (sinus pain).  Chest pain.  You have long-lasting (chronic) lung disease and any of the following:  Wheezing.  Long-lasting cough.  Coughing up blood.  A change in your usual mucus.  You have a stiff neck.  You have  changes in your:  Vision.  Hearing.  Thinking.  Mood. This information is not intended to replace advice given to you by your health care provider. Make sure you discuss any questions you have with your health care provider. Document Released: 01/06/2008 Document Revised: 03/22/2016 Document Reviewed: 10/25/2013 Elsevier Interactive Patient Education  2017 [employer] Inc.  

## 2016-08-21 ENCOUNTER — Other Ambulatory Visit: Payer: Self-pay | Admitting: Physician Assistant

## 2016-08-21 DIAGNOSIS — R059 Cough, unspecified: Secondary | ICD-10-CM | POA: Insufficient documentation

## 2016-08-21 DIAGNOSIS — R05 Cough: Secondary | ICD-10-CM

## 2016-08-21 MED ORDER — PROMETHAZINE-DM 6.25-15 MG/5ML PO SYRP: 5.0000 mL | ORAL_SOLUTION | Freq: Every evening | ORAL | 0 refills | Status: DC | PRN

## 2016-08-21 NOTE — Progress Notes (Signed)
Pt advised-aa 

## 2016-08-21 NOTE — Progress Notes (Signed)
Patient seen last week for URI symptoms. CXR was clear, patient given Tamiflu. Patient is feeling better today per phone call, but is having cough at night. Cannot call in Hycodan as it is controlled substance, but can send in phenergan Dm. This is sedating cough medication, take 1 tsp at night, no driving. Please advise pt that Hycodan could not be called in and I have sent this to [facility] instead. Hope she continues to improve.

## 2016-08-25 ENCOUNTER — Ambulatory Visit: Payer: Commercial Managed Care - PPO | Admitting: Family Medicine

## 2016-12-28 ENCOUNTER — Other Ambulatory Visit: Payer: Self-pay | Admitting: Family Medicine

## 2016-12-28 DIAGNOSIS — E538 Deficiency of other specified B group vitamins: Secondary | ICD-10-CM

## 2017-01-25 ENCOUNTER — Ambulatory Visit (INDEPENDENT_AMBULATORY_CARE_PROVIDER_SITE_OTHER): Payer: Commercial Managed Care - PPO | Admitting: Family Medicine

## 2017-01-25 ENCOUNTER — Encounter: Payer: Self-pay | Admitting: Family Medicine

## 2017-01-25 VITALS — BP 124/78 | HR 68 | Temp 97.5°F | Resp 16 | Wt 128.0 lb

## 2017-01-25 DIAGNOSIS — E538 Deficiency of other specified B group vitamins: Secondary | ICD-10-CM

## 2017-01-25 DIAGNOSIS — M2550 Pain in unspecified joint: Secondary | ICD-10-CM

## 2017-01-25 DIAGNOSIS — E559 Vitamin D deficiency, unspecified: Secondary | ICD-10-CM | POA: Diagnosis not present

## 2017-01-25 NOTE — Progress Notes (Signed)
Subjective:  HPI Pt is here for a follow up for Vitamin B12 deficiency. She reports that she is feeling fairly well. She is still giving her own injections every 4 days. She can tell when it is injection day because she is anxious and "foggy headed". Last B12 level was checked on 07/23/16.  Prior to Admission medications   Medication Sig Start Date End Date Taking? Authorizing Provider  celecoxib (CELEBREX) 200 MG capsule Take 1 capsule (200 mg total) by mouth daily. 07/23/16   Jerrol Banana., MD  cyanocobalamin (,VITAMIN B-12,) 1000 MCG/ML injection use as directed 12/29/16   Jerrol Banana., MD  EPINEPHrine (EPIPEN 2-PAK) 0.3 mg/0.3 mL IJ SOAJ injection EPIPEN 2-PAK, 0.3MG /0.3ML (Injection Solution Auto-injector)  1 (one) Soln Auto-inj Soln Auto-inj Inject as needed for 0 days  Quantity: 1;  Refills: 12   Ordered :19-Jul-2014  Miguel Aschoff MD Miguel Aschoff MD;  Started [phone] Active Comments: Medication taken as needed.  07/02/14   [provider]  promethazine-dextromethorphan (PROMETHAZINE-DM) 6.25-15 MG/5ML syrup Take 5 mLs by mouth at bedtime as needed for cough. 08/21/16   Trinna Post, PA-C  SAFETY-LOK [facility] SYR 25GX5/8" 25G X 5/8" 3 ML MISC use as directed 03/30/16   Jerrol Banana., MD  Vitamin D, Ergocalciferol, (DRISDOL) 50000 units CAPS capsule take 1 capsule by mouth every week 03/30/16   Jerrol Banana., MD    Patient Active Problem List   Diagnosis Date Noted  . Cough 08/21/2016  . Endometriosis 04/25/2015  . Personal history of reproductive and obstetrical problems 04/25/2015  . HLD (hyperlipidemia) 04/25/2015  . B12 deficiency 04/25/2015    History reviewed. No pertinent past medical history.  Social History   Social History  . Marital status: Married    Spouse name: N/A  . Number of children: N/A  . Years of education: N/A   Occupational History  . Not on file.   Social History Main Topics  . Smoking  status: Former Smoker    Packs/day: 0.50    Years: 20.00    Types: Cigarettes    Quit date: 08/03/1990  . Smokeless tobacco: Never Used  . Alcohol use 0.0 oz/week     Comment: 2 per month  . Drug use: No  . Sexual activity: Not on file   Other Topics Concern  . Not on file   Social History Narrative  . No narrative on file    Allergies  Allergen Reactions  . Bee Venom   . Cefaclor   . Eggs Or Egg-Derived Products   . Iodinated Diagnostic Agents   . Iohexol      Desc: had severe reaction in october '05   swelling, sob and throat closing   . Doxycycline Rash    Review of Systems  Constitutional: Negative.   HENT: Negative.   Eyes: Negative.   Respiratory: Negative.   Cardiovascular: Negative.   Gastrointestinal: Negative.   Genitourinary: Negative.   Musculoskeletal: Negative.   Skin: Negative.   Neurological: Negative.   Endo/Heme/Allergies: Negative.   Psychiatric/Behavioral: Negative.     Immunization History  Administered Date(s) Administered  . Td 09/17/2004  . Tdap 06/19/2015  . Zoster 05/03/2012    Objective:  BP 124/78 (BP Location: Left Arm, Patient Position: Sitting, Cuff Size: Normal)   Pulse 68   Temp 97.5 F (36.4 C) (Oral)   Resp 16   Wt 128 lb (58.1 kg)   BMI 20.05 kg/m   Physical  Exam  Constitutional: She is oriented to person, place, and time and well-developed, well-nourished, and in no distress.  Eyes: Conjunctivae and EOM are normal. Pupils are equal, round, and reactive to light.  Neck: Normal range of motion. Neck supple.  Cardiovascular: Normal rate, regular rhythm, normal heart sounds and intact distal pulses.   Pulmonary/Chest: Effort normal and breath sounds normal.  Musculoskeletal: Normal range of motion.  Neurological: She is alert and oriented to person, place, and time. She has normal reflexes. Gait normal. GCS score is 15.  Skin: Skin is warm and dry.  Psychiatric: Mood, memory, affect and judgment normal.    Lab  Results  Component Value Date   WBC 5.6 07/23/2016   HGB 10.5 (L) 07/23/2016   HCT 33.9 (L) 07/23/2016   PLT 235 07/23/2016   GLUCOSE 95 06/22/2016   CHOL 248 (H) 06/26/2015   TRIG 203 (H) 06/26/2015   HDL 53 06/26/2015   LDLCALC 154 (H) 06/26/2015   TSH 3.270 07/23/2016    CMP     Component Value Date/Time   NA 141 06/22/2016 1604   K 4.8 06/22/2016 1604   CL 103 06/22/2016 1604   CO2 27 06/22/2016 1604   GLUCOSE 95 06/22/2016 1604   BUN 8 06/22/2016 1604   CREATININE 0.56 (L) 06/22/2016 1604   CALCIUM 8.2 (L) 06/22/2016 1604   PROT 6.2 06/22/2016 1604   ALBUMIN 3.9 06/22/2016 1604   AST 14 06/22/2016 1604   ALT 9 06/22/2016 1604   ALKPHOS 164 (H) 06/22/2016 1604   BILITOT 0.3 06/22/2016 1604   GFRNONAA 98 06/22/2016 1604   GFRAA 113 06/22/2016 1604    Assessment and Plan :  1. B12 deficiency Pt taking B12 injections every 4 days. Stable.   2. Vitamin D deficiency   3. Arthralgia, unspecified joint Resolved.   HPI, Exam, and A&P Transcribed under the direction and in the presence of Richard L. Cranford Mon, MD  Electronically Signed: Katina Dung, CMA I have done the exam and reviewed the above chart and it is accurate to the best of my knowledge. Development worker, community has been used in this note in any air is in the dictation or transcription are unintentional.  [facility] 01/25/2017 10:23 AM

## 2017-03-30 ENCOUNTER — Ambulatory Visit (INDEPENDENT_AMBULATORY_CARE_PROVIDER_SITE_OTHER): Payer: Commercial Managed Care - PPO | Admitting: Physician Assistant

## 2017-03-30 VITALS — BP 112/60 | HR 72 | Temp 97.7°F | Resp 16 | Wt 125.0 lb

## 2017-03-30 DIAGNOSIS — H60502 Unspecified acute noninfective otitis externa, left ear: Secondary | ICD-10-CM | POA: Diagnosis not present

## 2017-03-30 MED ORDER — CIPROFLOXACIN-DEXAMETHASONE 0.3-0.1 % OT SUSP: 4.0000 [drp] | Freq: Two times a day (BID) | OTIC | 0 refills | Status: DC

## 2017-03-30 NOTE — Progress Notes (Signed)
[name] Female    DOB: Aug 27, 1950   66 y.o.   MRN: [id number] Visit Date: 03/30/2017  Today's Provider: Trinna Post, PA-C   Chief Complaint  Patient presents with  . Ear Pain    Left ear; started last week.    Subjective:     Tracey Wolf is a 66 y/o woman presenting with left ear pain x one week. Assoc. W/ headache and sore throat. Her left ear hurts when she lies down on pillow. Does not swim or wear earbuds. Some occasional dizziness, no focal weakness.  Otalgia   There is pain in the left ear. This is a new problem. The current episode started 1 to 4 weeks ago. The problem has been gradually worsening. There has been no fever. Associated symptoms include headaches and a sore throat. Pertinent negatives include no abdominal pain, coughing, diarrhea, ear discharge, neck pain, rash, rhinorrhea or vomiting.       Allergies  Allergen Reactions  . Bee Venom   . Cefaclor   . Eggs Or Egg-Derived Products   . Iodinated Diagnostic Agents   . Iohexol      Desc: had severe reaction in october '05   swelling, sob and throat closing   . Doxycycline Rash     Current Outpatient Prescriptions:  .  cyanocobalamin (,VITAMIN B-12,) 1000 MCG/ML injection, use as directed, Disp: 10 mL, Rfl: 12 .  EPINEPHrine (EPIPEN 2-PAK) 0.3 mg/0.3 mL IJ SOAJ injection, EPIPEN 2-PAK, 0.3MG /0.3ML (Injection Solution Auto-injector)  1 (one) Soln Auto-inj Soln Auto-inj Inject as needed for 0 days  Quantity: 1;  Refills: 12   Ordered :19-Jul-2014  Miguel Aschoff MD Miguel Aschoff MD;  Started [phone] Active Comments: Medication taken as needed. , Disp: , Rfl:  .  SAFETY-LOK 3CC SYR 25GX5/8" 25G X 5/8" 3 ML MISC, use as directed, Disp: 50 each, Rfl: 11 .  Vitamin D, Ergocalciferol, (DRISDOL) 50000 units CAPS capsule, take 1 capsule by mouth every week, Disp: 4 capsule, Rfl: 12 .  celecoxib (CELEBREX) 200 MG capsule, Take 1 capsule (200 mg total) by mouth daily. (Patient  not taking: Reported on 03/30/2017), Disp: 30 capsule, Rfl: 1  Review of Systems  Constitutional: Positive for fatigue. Negative for activity change, appetite change, chills, diaphoresis, fever and unexpected weight change.  HENT: Positive for ear pain, sinus pain (All on the left side), sinus pressure, sneezing, sore throat and tinnitus. Negative for congestion, dental problem, ear discharge, nosebleeds, postnasal drip, rhinorrhea, trouble swallowing and voice change.   Eyes: Positive for discharge and itching. Negative for photophobia, pain, redness and visual disturbance.  Respiratory: Negative.  Negative for cough.   Gastrointestinal: Negative.  Negative for abdominal pain, diarrhea and vomiting.  Musculoskeletal: Negative for neck pain.  Skin: Negative for rash.  Neurological: Positive for dizziness, light-headedness and headaches.    Social History  Substance Use Topics  . Smoking status: Former Smoker    Packs/day: 0.50    Years: 20.00    Types: Cigarettes    Quit date: 08/03/1990  . Smokeless tobacco: Never Used  . Alcohol use 0.0 oz/week     Comment: 2 per month   Objective:   BP 112/60 (BP Location: Left Arm, Patient Position: Sitting, Cuff Size: Normal)   Pulse 72   Temp 97.7 F (36.5 C) (Oral)   Resp 16   Wt 125 lb (56.7 kg)   BMI 19.58 kg/m  Vitals:   03/30/17 1358  BP:  112/60  Pulse: 72  Resp: 16  Temp: 97.7 F (36.5 C)  TempSrc: Oral  Weight: 125 lb (56.7 kg)     Physical Exam  Constitutional: She is oriented to person, place, and time. She appears well-developed and well-nourished.  HENT:  Right Ear: Tympanic membrane and external ear normal. No drainage. Tympanic membrane is not erythematous and not bulging.  Left Ear: Tympanic membrane normal. There is swelling and tenderness. No drainage. Tympanic membrane is not erythematous and not bulging.  Mouth/Throat: Oropharynx is clear and moist. No oropharyngeal exudate.  Tenderness with tragal manipulation  of left ear. Erythematous and swollen EAC with normal TM.  Pulmonary/Chest: Effort normal and breath sounds normal.  Lymphadenopathy:    She has no cervical adenopathy.  Neurological: She is alert and oriented to person, place, and time.  Skin: Skin is warm and dry.  Psychiatric: She has a normal mood and affect. Her behavior is normal.        Assessment & Plan:     1. Acute otitis externa of left ear, unspecified type  Suspect she also has some seasonal allergies as well contributing to sore throat and eustachian tube dysfunction.   - ciprofloxacin-dexamethasone (CIPRODEX) OTIC suspension; Place 4 drops into the left ear 2 (two) times daily.  Dispense: 7.5 mL; Refill: 0  Return if symptoms worsen or fail to improve.  The entirety of the information documented in the History of Present Illness, Review of Systems and Physical Exam were personally obtained by me. Portions of this information were initially documented by Ashley Royalty, CMA and reviewed by me for thoroughness and accuracy.        Trinna Post, PA-C  [facility]

## 2017-03-30 NOTE — Patient Instructions (Signed)
Otitis Externa Otitis externa is an infection of the outer ear canal. The outer ear canal is the area between the outside of the ear and the eardrum. Otitis externa is sometimes called "swimmer's ear." Follow these instructions at home:  If you were given antibiotic ear drops, use them as told by your doctor. Do not stop using them even if your condition gets better.  Take over-the-counter and prescription medicines only as told by your doctor.  Keep all follow-up visits as told by your doctor. This is important. How is this prevented?  Keep your ear dry. Use the corner of a towel to dry your ear after you swim or bathe.  Try not to scratch or put things in your ear. Doing these things makes it easier for germs to grow in your ear.  Avoid swimming in lakes, dirty water, or pools that may not have the right amount of a chemical called chlorine.  Consider making ear drops and putting 3 or 4 drops in each ear after you swim. Ask your doctor about how you can make ear drops. Contact a doctor if:  You have a fever.  After 3 days your ear is still red, swollen, or painful.  After 3 days you still have pus coming from your ear.  Your redness, swelling, or pain gets worse.  You have a really bad headache.  You have redness, swelling, pain, or tenderness behind your ear. This information is not intended to replace advice given to you by your health care provider. Make sure you discuss any questions you have with your health care provider. Document Released: 01/06/2008 Document Revised: 08/15/2015 Document Reviewed: 04/29/2015 Elsevier Interactive Patient Education  2018 [employer]vier Inc[employer]  

## 2017-04-08 ENCOUNTER — Other Ambulatory Visit: Payer: Self-pay | Admitting: Family Medicine

## 2017-04-08 MED ORDER — EPINEPHRINE 0.3 MG/0.3ML IJ SOAJ: INTRAMUSCULAR | 5 refills | Status: DC

## 2017-04-08 NOTE — Telephone Encounter (Signed)
Pt contacted office for refill request on the following medications:  EPINEPHrine (EPIPEN 2-PAK) 0.3 mg/0.3 mL IJ SOAJ injection   Last RX: 07/2014 LOV: 01/25/17  Pt stated she just realized that her Epi pen was out of date and the Rx was so old the refill is no longer valid. Pt would like Rx sent to [facility] on [address]. Please advise. Thanks TNP

## 2017-04-20 ENCOUNTER — Other Ambulatory Visit: Payer: Self-pay | Admitting: Family Medicine

## 2017-05-21 ENCOUNTER — Ambulatory Visit (INDEPENDENT_AMBULATORY_CARE_PROVIDER_SITE_OTHER): Payer: Commercial Managed Care - PPO | Admitting: Physician Assistant

## 2017-05-21 VITALS — BP 138/88 | HR 76 | Temp 97.6°F | Resp 14 | Wt 132.0 lb

## 2017-05-21 DIAGNOSIS — R310 Gross hematuria: Secondary | ICD-10-CM

## 2017-05-21 LAB — POCT URINALYSIS DIPSTICK
Bilirubin, UA: NEGATIVE
Glucose, UA: NEGATIVE
Ketones, UA: NEGATIVE
Nitrite, UA: NEGATIVE
Protein, UA: NEGATIVE
Spec Grav, UA: 1.025 (ref 1.010–1.025)
Urobilinogen, UA: 0.2 E.U./dL
pH, UA: 6 (ref 5.0–8.0)

## 2017-05-21 MED ORDER — SULFAMETHOXAZOLE-TRIMETHOPRIM 800-160 MG PO TABS: 1.0000 | ORAL_TABLET | Freq: Two times a day (BID) | ORAL | 0 refills | Status: AC

## 2017-05-21 NOTE — Progress Notes (Signed)
[name] Female    DOB: 04-Feb-1951   66 y.o.   MRN: [id number] Visit Date: 05/21/2017  Today's Provider: Trinna Post, PA-C   Chief Complaint  Patient presents with  . Hematuria   Subjective:    HPI  Tracey Wolf is a 66 y/o woman with a 10 pack year smoking history presenting today for blood in the urine. She had to do a physical for insurance and received a call last week that her urinalysis showed blood in it. Patient did not find out about that call till this week on Tuesday 05/18/17. Patient is not having any urinary issues or concerns. She has not seen any blood in her urine. No vaginal irritation at this time. She has no rectal bleeding. She was not aware this is an issue. She was told by insurance representative that gave her results to follow up with her PCP on this. She has no pelvic pain, back pain, nausea, vomiting, fever, chills. She has no history of kidney stones. She has no history of living abroad or working in a factory.       Allergies  Allergen Reactions  . Bee Venom   . Cefaclor   . Eggs Or Egg-Derived Products   . Iodinated Diagnostic Agents   . Iohexol      Desc: had severe reaction in october '05   swelling, sob and throat closing   . Doxycycline Rash     Current Outpatient Prescriptions:  .  cyanocobalamin (,VITAMIN B-12,) 1000 MCG/ML injection, use as directed, Disp: 10 mL, Rfl: 12 .  EPINEPHrine (EPIPEN 2-PAK) 0.3 mg/0.3 mL IJ SOAJ injection, EPIPEN 2-PAK, 0.3MG /0.3ML (Injection Solution Auto-injector)  1 (one) Soln Auto-inj Soln Auto-inj Inject as needed for 0 days  Quantity: 1;  Refills: 12   Ordered :19-Jul-2014  Miguel Aschoff MD Miguel Aschoff MD;  Started [phone] Active Comments: Medication taken as needed., Disp: 1 Device, Rfl: 5 .  SAFETY-LOK 3CC SYR 25GX5/8" 25G X 5/8" 3 ML MISC, use as directed, Disp: 50 each, Rfl: 11 .  Vitamin D, Ergocalciferol, (DRISDOL) 50000 units CAPS capsule, take 1 capsule by  mouth every week, Disp: 4 capsule, Rfl: 12  Review of Systems  Constitutional: Negative.   Respiratory: Negative.   Cardiovascular: Negative.   Gastrointestinal: Positive for abdominal pain (RLQ chronic per patient).  Genitourinary: Negative.   Musculoskeletal: Negative.   Neurological: Negative.     Social History  Substance Use Topics  . Smoking status: Former Smoker    Packs/day: 0.50    Years: 20.00    Types: Cigarettes    Quit date: 08/03/1990  . Smokeless tobacco: Never Used  . Alcohol use 0.0 oz/week     Comment: 2 per month   Objective:   BP 138/88   Pulse 76   Temp 97.6 F (36.4 C)   Resp 14   Wt 132 lb (59.9 kg)   BMI 20.67 kg/m  Vitals:   05/21/17 1611  BP: 138/88  Pulse: 76  Resp: 14  Temp: 97.6 F (36.4 C)  Weight: 132 lb (59.9 kg)     Physical Exam  Constitutional: She is oriented to person, place, and time. She appears well-developed and well-nourished.  Cardiovascular: Normal rate and regular rhythm.   Pulmonary/Chest: Effort normal and breath sounds normal.  Abdominal: Soft. Bowel sounds are normal. She exhibits no distension. There is no CVA tenderness.  Neurological: She is alert and oriented to person, place, and  time.  Skin: Skin is warm and dry.  Psychiatric: She has a normal mood and affect. Her behavior is normal.        Assessment & Plan:     1. Gross hematuria  Large blood cells on todays dipstick. Will send for culture and microscopic. Will treat empirically for infection with Bactrim. DDx: infection, nephrolithiasis, malignancy. Depending on microscopic analysis, may refer to urology.   - POCT urinalysis dipstick - Urinalysis, microscopic only - Urine Culture - sulfamethoxazole-trimethoprim (BACTRIM DS) 800-160 MG tablet; Take 1 tablet by mouth 2 (two) times daily.  Dispense: 6 tablet; Refill: 0  Return if symptoms worsen or fail to improve.  The entirety of the information documented in the History of Present Illness,  Review of Systems and Physical Exam were personally obtained by me. Portions of this information were initially documented by [facility] and reviewed by me for thoroughness and accuracy.           Trinna Post, PA-C  [facility]

## 2017-05-21 NOTE — Patient Instructions (Signed)

## 2017-05-22 LAB — URINALYSIS, MICROSCOPIC ONLY
Bacteria, UA: NONE SEEN /HPF
Hyaline Cast: NONE SEEN /LPF
Squamous Epithelial / LPF: NONE SEEN /HPF (ref <=5)

## 2017-05-23 LAB — URINE CULTURE
MICRO NUMBER:: 81173236
SPECIMEN QUALITY:: ADEQUATE

## 2017-05-24 ENCOUNTER — Telehealth: Payer: Self-pay

## 2017-05-24 NOTE — Telephone Encounter (Signed)
-----   Message from Trinna Post, [place] sent at 05/24/2017  1:56 PM EDT ----- 10-20 RBC per HPF, recommend referring to urology for further workup. Will place this referral today to [facility].

## 2017-05-24 NOTE — Telephone Encounter (Signed)
LMTCB 05/24/2017  Thanks,   -Mickel Baas

## 2017-05-24 NOTE — Telephone Encounter (Signed)
-----   Message from Trinna Post, [place] sent at 05/24/2017  2:02 PM EDT ----- Urine culture grew out multiple organisms, likely contamination.

## 2017-05-24 NOTE — Telephone Encounter (Signed)
Pt is returning call.  CB#[phone]/MW

## 2017-05-25 ENCOUNTER — Other Ambulatory Visit: Payer: Self-pay | Admitting: Physician Assistant

## 2017-05-25 DIAGNOSIS — R319 Hematuria, unspecified: Secondary | ICD-10-CM

## 2017-05-25 NOTE — Telephone Encounter (Signed)
Pt is returning call/MW °

## 2017-05-26 NOTE — Telephone Encounter (Signed)
Pt advised.   Thanks,   -Laura  

## 2017-05-27 NOTE — Progress Notes (Addendum)
05/31/2017 10:29 AM   Tracey Wolf [name] [birth year] [id number]  Referring provider: Jerrol Banana., MD [address] [place], [place] [address]  Chief Complaint  Patient presents with  . New Patient (Initial Visit)  . Hematuria    HPI: Patient is a 66 -year-old Caucasian female who presents today as a referral from their PCP, [name], PA-C , for microscopic hematuria.    Patient had a physical for insurance purposes and was told that she had blood in her urine.  She followed up with Ms. Margretta Sidle and was found to have microscopic hematuria on 05/21/2017 with 10-20 RBC's/hpf.  Patient doesn't have a prior history of microscopic hematuria.    She does not have a prior history of recurrent urinary tract infections, nephrolithiasis, trauma to the genitourinary tract or malignancies of the genitourinary tract.   Her brother has a history of large kidney stones.  She states that he almost bled out during the surgery to removed the stones because the surgeon nicked the kidney.  She also mentioned that her mother died after being injured with intubation and which resulted in blood in her lungs.  Her mother did have a tumor in vena cava.  She does not have a family medical history of malignancies of the genitourinary tract or hematuria.   Today, she is having symptoms of intermittent dysuria and occasional nocturia.  Her UA today demonstrates 3-10 RBC's.  She is not experiencing any suprapubic pain, abdominal pain or flank pain.  She denies any recent fevers, chills, nausea or vomiting.   She has not had any recent imaging studies.   She is a former smoker.   She has not worked with Sports administrator, trichloroethylene, etc.       PMH: Past Medical History:  Diagnosis Date  . B12 deficiency   . Hyperlipidemia     Surgical History: Past Surgical History:  Procedure Laterality Date  . ABDOMINAL HYSTERECTOMY    . DILATION AND CURETTAGE OF UTERUS    . TUBAL  LIGATION      Home Medications:  Allergies as of 05/31/2017      Reactions   Bee Venom    Cefaclor    Eggs Or Egg-derived Products    Iodinated Diagnostic Agents    Iohexol     Desc: had severe reaction in october '05   swelling, sob and throat closing   Doxycycline Rash      Medication List       Accurate as of 05/31/17 10:29 AM. Always use your most recent med list.          cyanocobalamin 1000 MCG/ML injection Commonly known as:  (VITAMIN B-12) use as directed   diphenhydrAMINE 50 MG tablet Commonly known as:  BENADRYL One one hour prior to the CT Urogram   EPINEPHrine 0.3 mg/0.3 mL Soaj injection Commonly known as:  EPIPEN 2-PAK EPIPEN 2-PAK, 0.3MG /0.3ML (Injection Solution Auto-injector)  1 (one) Soln Auto-inj Soln Auto-inj Inject as needed for 0 days  Quantity: 1;  Refills: 12   Ordered :19-Jul-2014  Miguel Aschoff MD Miguel Aschoff MD;  Started [phone] Active Comments: Medication taken as needed.   predniSONE 50 MG tablet Commonly known as:  DELTASONE Take the one tablet 13 hours, 7 hours and 1 hours prior to the CT Urogram   ranitidine 150 MG tablet Commonly known as:  ZANTAC Take one tablet 1 hour prior to CT Urogram   SAFETY-LOK 3CC SYR 25GX5/8" 25G X 5/8" 3  ML Misc Generic drug:  SYRINGE-NEEDLE (DISP) 3 ML use as directed   Vitamin D (Ergocalciferol) 50000 units Caps capsule Commonly known as:  DRISDOL take 1 capsule by mouth every week       Allergies:  Allergies  Allergen Reactions  . Bee Venom   . Cefaclor   . Eggs Or Egg-Derived Products   . Iodinated Diagnostic Agents   . Iohexol      Desc: had severe reaction in october '05   swelling, sob and throat closing   . Doxycycline Rash    Family History: Family History  Problem Relation Age of Onset  . Diabetes Mother   . Heart attack Mother   . COPD Father   . Pulmonary embolism Father   . Hyperlipidemia Sister   . Hyperlipidemia Brother   . Kidney disease Brother   .  Kidney Stones Brother   . Hyperlipidemia Daughter   . Kidney cancer Neg Hx   . Prostate cancer Neg Hx     Social History:  reports that she quit smoking about 26 years ago. Her smoking use included Cigarettes. She has a 10.00 pack-year smoking history. She has never used smokeless tobacco. She reports that she drinks alcohol. She reports that she does not use drugs.  ROS: UROLOGY Frequent Urination?: No Hard to postpone urination?: No Burning/pain with urination?: Yes Get up at night to urinate?: Yes Leakage of urine?: No Urine stream starts and stops?: No Trouble starting stream?: Yes Do you have to strain to urinate?: No Blood in urine?: Yes Urinary tract infection?: No Sexually transmitted disease?: No Injury to kidneys or bladder?: No Painful intercourse?: Yes Weak stream?: No Currently pregnant?: No Vaginal bleeding?: No Last menstrual period?: 2001  Gastrointestinal Nausea?: No Vomiting?: No Indigestion/heartburn?: No Diarrhea?: No Constipation?: No  Constitutional Fever: No Night sweats?: No Weight loss?: No Fatigue?: Yes  Skin Skin rash/lesions?: No Itching?: Yes  Eyes Blurred vision?: No Double vision?: No  Ears/Nose/Throat Sore throat?: No Sinus problems?: Yes  Hematologic/Lymphatic Swollen glands?: No Easy bruising?: Yes  Cardiovascular Leg swelling?: No Chest pain?: No  Respiratory Cough?: No Shortness of breath?: No  Endocrine Excessive thirst?: No  Musculoskeletal Back pain?: No Joint pain?: No  Neurological Headaches?: No Dizziness?: No  Psychologic Depression?: No Anxiety?: No  Physical Exam: BP 139/82   Pulse 76   Ht 5\' 7"  (1.702 m)   Wt 125 lb (56.7 kg)   BMI 19.58 kg/m   Constitutional: Well nourished. Alert and oriented, No acute distress. HEENT: [place] AT, moist mucus membranes. Trachea midline, no masses. Cardiovascular: No clubbing, cyanosis, or edema. Respiratory: Normal respiratory effort, no increased  work of breathing. GI: Abdomen is soft, non tender, non distended, no abdominal masses. Liver and spleen not palpable.  No hernias appreciated.  Stool sample for occult testing is not indicated.   GU: No CVA tenderness.  No bladder fullness or masses.   Skin: No rashes, bruises or suspicious lesions. Lymph: No cervical or inguinal adenopathy. Neurologic: Grossly intact, no focal deficits, moving all 4 extremities. Psychiatric: Normal mood and affect.  Laboratory Data: Lab Results  Component Value Date   WBC 5.6 07/23/2016   HGB 10.5 (L) 07/23/2016   HCT 33.9 (L) 07/23/2016   MCV 88 07/23/2016   PLT 235 07/23/2016    Lab Results  Component Value Date   CREATININE 0.56 (L) 06/22/2016    Lab Results  Component Value Date   TSH 3.270 07/23/2016    Lab Results  Component  Value Date   AST 14 06/22/2016   Lab Results  Component Value Date   ALT 9 06/22/2016    Urinalysis 3-10 RBC's.  See EPIC.  I have reviewed the labs.   Assessment & Plan:   1. Microscopic hematuria  - I explained to the patient that there are a number of causes that can be associated with blood in the urine, such as stones, UTI's, damage to the urinary tract and/or cancer.  - At this time, I felt that the patient warranted further urologic evaluation.   The AUA guidelines state that a CT urogram is the preferred imaging study to evaluate hematuria.  - I explained to the patient that a contrast material will be injected into a vein and that in rare instances, an allergic reaction can result and may even life threatening   The patient has allergies to contrast (given allergy prep) and is not taking metformin.  - Her reproductive status is s/p hysterectomy  - Following the imaging study,  I've recommended a cystoscopy. I described how this is performed, typically in an office setting with a flexible cystoscope. We described the risks, benefits, and possible side effects, the most common of which is a minor  amount of blood in the urine and/or burning which usually resolves in 24 to 48 hours.    - The patient had the opportunity to ask questions which were answered. Based upon this discussion, the patient is willing to proceed. Therefore, I've ordered: a CT Urogram and cystoscopy.  - The patient will return following all of the above for discussion of the results.   - UA  - Urine culture  - BUN + creatinine    2. Allergies to contrast  - Patient given the allergy prep to prednisone, Benadryl and Zantac  - She states that the allergic reaction was a rash and she was also taking doxycycline at the same time so she is unsure if she is truly allergic to iodine    Return for CT Urogram report and cystoscopy.  These notes generated with voice recognition software. I apologize for typographical errors.  Zara Council, [facility] [address], [place],  [address] [phone]

## 2017-05-31 ENCOUNTER — Encounter: Payer: Self-pay | Admitting: Family Medicine

## 2017-05-31 ENCOUNTER — Ambulatory Visit (INDEPENDENT_AMBULATORY_CARE_PROVIDER_SITE_OTHER): Payer: Commercial Managed Care - PPO | Admitting: Urology

## 2017-05-31 ENCOUNTER — Encounter: Payer: Self-pay | Admitting: Urology

## 2017-05-31 VITALS — BP 139/82 | HR 76 | Ht 67.0 in | Wt 125.0 lb

## 2017-05-31 DIAGNOSIS — R3129 Other microscopic hematuria: Secondary | ICD-10-CM | POA: Diagnosis not present

## 2017-05-31 DIAGNOSIS — Z888 Allergy status to other drugs, medicaments and biological substances status: Secondary | ICD-10-CM | POA: Diagnosis not present

## 2017-05-31 LAB — URINALYSIS, COMPLETE
Bilirubin, UA: NEGATIVE
GLUCOSE, UA: NEGATIVE
KETONES UA: NEGATIVE
LEUKOCYTES UA: NEGATIVE
Nitrite, UA: NEGATIVE
Protein, UA: NEGATIVE
SPEC GRAV UA: 1.01 (ref 1.005–1.030)
Urobilinogen, Ur: 0.2 mg/dL (ref 0.2–1.0)
pH, UA: 7 (ref 5.0–7.5)

## 2017-05-31 LAB — MICROSCOPIC EXAMINATION
BACTERIA UA: NONE SEEN
Epithelial Cells (non renal): NONE SEEN /hpf (ref 0–10)
WBC, UA: NONE SEEN /hpf (ref 0–?)

## 2017-05-31 MED ORDER — DIPHENHYDRAMINE HCL 50 MG PO TABS: ORAL_TABLET | ORAL | 0 refills | Status: DC

## 2017-05-31 MED ORDER — RANITIDINE HCL 150 MG PO TABS: ORAL_TABLET | ORAL | 0 refills | Status: DC

## 2017-05-31 MED ORDER — PREDNISONE 50 MG PO TABS: ORAL_TABLET | ORAL | 0 refills | Status: DC

## 2017-06-01 LAB — BUN+CREAT
BUN / CREAT RATIO: 13 (ref 12–28)
BUN: 12 mg/dL (ref 8–27)
CREATININE: 0.9 mg/dL (ref 0.57–1.00)
GFR, EST AFRICAN AMERICAN: 77 mL/min/{1.73_m2} (ref >59)
GFR, EST NON AFRICAN AMERICAN: 67 mL/min/{1.73_m2} (ref >59)

## 2017-06-03 ENCOUNTER — Encounter: Payer: Self-pay | Admitting: Family Medicine

## 2017-06-04 LAB — CULTURE, URINE COMPREHENSIVE

## 2017-06-09 ENCOUNTER — Ambulatory Visit (INDEPENDENT_AMBULATORY_CARE_PROVIDER_SITE_OTHER): Payer: Commercial Managed Care - PPO | Admitting: Family Medicine

## 2017-06-09 ENCOUNTER — Encounter: Payer: Self-pay | Admitting: Family Medicine

## 2017-06-09 VITALS — BP 120/66 | HR 68 | Temp 98.4°F | Resp 14 | Ht 67.0 in | Wt 131.2 lb

## 2017-06-09 DIAGNOSIS — E7849 Other hyperlipidemia: Secondary | ICD-10-CM

## 2017-06-09 DIAGNOSIS — E559 Vitamin D deficiency, unspecified: Secondary | ICD-10-CM

## 2017-06-09 DIAGNOSIS — Z1211 Encounter for screening for malignant neoplasm of colon: Secondary | ICD-10-CM | POA: Diagnosis not present

## 2017-06-09 DIAGNOSIS — Z91012 Allergy to eggs: Secondary | ICD-10-CM

## 2017-06-09 DIAGNOSIS — Z0000 Encounter for general adult medical examination without abnormal findings: Secondary | ICD-10-CM

## 2017-06-09 DIAGNOSIS — E538 Deficiency of other specified B group vitamins: Secondary | ICD-10-CM | POA: Diagnosis not present

## 2017-06-09 DIAGNOSIS — R310 Gross hematuria: Secondary | ICD-10-CM | POA: Diagnosis not present

## 2017-06-09 DIAGNOSIS — Z23 Encounter for immunization: Secondary | ICD-10-CM | POA: Diagnosis not present

## 2017-06-09 DIAGNOSIS — Z01419 Encounter for gynecological examination (general) (routine) without abnormal findings: Secondary | ICD-10-CM

## 2017-06-09 LAB — COMPLETE METABOLIC PANEL WITH GFR
AG RATIO: 1.8 (calc) (ref 1.0–2.5)
ALKALINE PHOSPHATASE (APISO): 102 U/L (ref 33–130)
ALT: 8 U/L (ref 6–29)
AST: 13 U/L (ref 10–35)
Albumin: 4.4 g/dL (ref 3.6–5.1)
BUN: 16 mg/dL (ref 7–25)
CALCIUM: 9.2 mg/dL (ref 8.6–10.4)
CO2: 28 mmol/L (ref 20–32)
Chloride: 103 mmol/L (ref 98–110)
Creat: 0.81 mg/dL (ref 0.50–0.99)
GFR, EST NON AFRICAN AMERICAN: 76 mL/min/{1.73_m2} (ref >=60)
GFR, Est African American: 88 mL/min/{1.73_m2} (ref >=60)
Globulin: 2.5 g/dL (calc) (ref 1.9–3.7)
Glucose, Bld: 88 mg/dL (ref 65–99)
POTASSIUM: 4.1 mmol/L (ref 3.5–5.3)
Sodium: 139 mmol/L (ref 135–146)
Total Bilirubin: 0.6 mg/dL (ref 0.2–1.2)
Total Protein: 6.9 g/dL (ref 6.1–8.1)

## 2017-06-09 LAB — LIPID PANEL
Cholesterol: 266 mg/dL — ABNORMAL HIGH (ref <200)
HDL: 55 mg/dL (ref >50)
LDL CHOLESTEROL (CALC): 169 mg/dL — AB
NON-HDL CHOLESTEROL (CALC): 211 mg/dL — AB (ref <130)
TRIGLYCERIDES: 256 mg/dL — AB (ref <150)
Total CHOL/HDL Ratio: 4.8 (calc) (ref <5.0)

## 2017-06-09 LAB — CBC WITH DIFFERENTIAL/PLATELET
Basophils Absolute: 21 cells/uL (ref 0–200)
Basophils Relative: 0.4 %
Eosinophils Absolute: 48 cells/uL (ref 15–500)
Eosinophils Relative: 0.9 %
HCT: 34.9 % — ABNORMAL LOW (ref 35.0–45.0)
Hemoglobin: 11.7 g/dL (ref 11.7–15.5)
Lymphs Abs: 1325 cells/uL (ref 850–3900)
MCH: 29.6 pg (ref 27.0–33.0)
MCHC: 33.5 g/dL (ref 32.0–36.0)
MCV: 88.4 fL (ref 80.0–100.0)
MPV: 8.6 fL (ref 7.5–12.5)
Monocytes Relative: 5.9 %
NEUTROS PCT: 67.8 %
Neutro Abs: 3593 cells/uL (ref 1500–7800)
PLATELETS: 239 10*3/uL (ref 140–400)
RBC: 3.95 10*6/uL (ref 3.80–5.10)
RDW: 13.7 % (ref 11.0–15.0)
TOTAL LYMPHOCYTE: 25 %
WBC: 5.3 10*3/uL (ref 3.8–10.8)
WBCMIX: 313 {cells}/uL (ref 200–950)

## 2017-06-09 LAB — TSH: TSH: 2.23 m[IU]/L (ref 0.40–4.50)

## 2017-06-09 NOTE — Progress Notes (Signed)
[name], Female    DOB: 02-Oct-1950, 66 y.o.   MRN: [id number] Visit Date: 06/09/2017  Today's Provider: Wilhemena Durie, MD   Chief Complaint  Patient presents with  . Annual Exam   Subjective:  Tracey Wolf is a 66 y.o. female who presents today for health maintenance and complete physical. She feels fairly well.She reports she is sleeping well. Patient is going through work up with urologist for blood in urine and has CT scan scheduled for November 12th. Immunization History  Administered Date(s) Administered  . Td 09/17/2004  . Tdap 06/19/2015  . Zoster 05/03/2012   Mammogram 09/26/10 negative-patient states she saw Dr Corinna Capra in 2017 and had another mammogram, BMD and pap smear done. BMD 09/21/11 normal Colonoscopy 07/17/04 Dr Perry-hyperplastic polyps. Pap smear- Dr Corinna Capra 2017- will get those records.  Review of Systems  Constitutional: Positive for chills and unexpected weight change.  HENT: Positive for ear pain, sinus pressure and tinnitus.   Eyes: Positive for photophobia.  Respiratory: Negative.   Cardiovascular: Negative.   Gastrointestinal: Negative.   Endocrine: Positive for cold intolerance.  Genitourinary: Positive for hematuria and vaginal pain.  Musculoskeletal: Positive for arthralgias.  Skin: Negative.   Allergic/Immunologic: Positive for food allergies.  Neurological: Positive for dizziness and light-headedness.  Hematological: Bruises/bleeds easily.  Psychiatric/Behavioral: Negative.     Social History   Socioeconomic History  . Marital status: Married    Spouse name: Not on file  . Number of children: Not on file  . Years of education: Not on file  . Highest education level: Not on file  Social Needs  . Financial resource strain: Not on file  . Food insecurity - worry: Not on file  . Food insecurity - inability: Not on file  . Transportation needs - medical: Not on file  . Transportation needs - non-medical: Not on file   Occupational History  . Not on file  Tobacco Use  . Smoking status: Former Smoker    Packs/day: 0.50    Years: 20.00    Pack years: 10.00    Types: Cigarettes    Last attempt to quit: 08/03/1990    Years since quitting: 26.8  . Smokeless tobacco: Never Used  Substance and Sexual Activity  . Alcohol use: Yes    Alcohol/week: 0.0 oz    Comment: 2 per month  . Drug use: No  . Sexual activity: Not on file  Other Topics Concern  . Not on file  Social History Narrative  . Not on file    Patient Active Problem List   Diagnosis Date Noted  . Cough 08/21/2016  . Endometriosis 04/25/2015  . Personal history of reproductive and obstetrical problems 04/25/2015  . HLD (hyperlipidemia) 04/25/2015  . B12 deficiency 04/25/2015    Past Surgical History:  Procedure Laterality Date  . ABDOMINAL HYSTERECTOMY    . DILATION AND CURETTAGE OF UTERUS    . TUBAL LIGATION      Her family history includes COPD in her father; Diabetes in her mother; Heart attack in her mother; Hyperlipidemia in her brother, daughter, and sister; Kidney Stones in her brother; Kidney disease in her brother; Pulmonary embolism in her father.     Outpatient Encounter Medications as of 06/09/2017  Medication Sig  . cyanocobalamin (,VITAMIN B-12,) 1000 MCG/ML injection use as directed  . diphenhydrAMINE (BENADRYL) 50 MG tablet One one hour prior to the CT Urogram  . EPINEPHrine (EPIPEN 2-PAK) 0.3 mg/0.3 mL IJ SOAJ injection EPIPEN 2-PAK,  0.3MG /0.3ML (Injection Solution Auto-injector)  1 (one) Soln Auto-inj Soln Auto-inj Inject as needed for 0 days  Quantity: 1;  Refills: 12   Ordered :19-Jul-2014  Miguel Aschoff MD Miguel Aschoff MD;  Started [phone] Active Comments: Medication taken as needed.  . predniSONE (DELTASONE) 50 MG tablet Take the one tablet 13 hours, 7 hours and 1 hours prior to the CT Urogram  . ranitidine (ZANTAC) 150 MG tablet Take one tablet 1 hour prior to CT Urogram  . SAFETY-LOK 3CC SYR  25GX5/8" 25G X 5/8" 3 ML MISC use as directed  . Vitamin D, Ergocalciferol, (DRISDOL) 50000 units CAPS capsule take 1 capsule by mouth every week   No facility-administered encounter medications on file as of 06/09/2017.     Patient Care Team: Jerrol Banana., MD as PCP - General (Family Medicine)      Objective:   Vitals:  Vitals:   06/09/17 0950  BP: 120/66  Pulse: 68  Resp: 14  Temp: 98.4 F (36.9 C)  Weight: 131 lb 3.2 oz (59.5 kg)  Height: 5\' 7"  (1.702 m)    Physical Exam  Constitutional: She is oriented to person, place, and time. She appears well-developed and well-nourished.  HENT:  Head: Normocephalic and atraumatic.  Right Ear: External ear normal.  Left Ear: External ear normal.  Nose: Nose normal.  Mouth/Throat: Oropharynx is clear and moist.  Eyes: Conjunctivae are normal. Pupils are equal, round, and reactive to light.  Neck: Normal range of motion. Neck supple.  Cardiovascular: Normal rate, regular rhythm, normal heart sounds and intact distal pulses. Exam reveals no gallop.  No murmur heard. Pulmonary/Chest: Effort normal and breath sounds normal. No respiratory distress. She has no wheezes.  Abdominal: Soft. She exhibits no distension. There is no tenderness.  Musculoskeletal: She exhibits no edema or tenderness.  Neurological: She is alert and oriented to person, place, and time. No cranial nerve deficit.  Skin: Skin is warm and dry. No rash noted. No erythema.  Psychiatric: She has a normal mood and affect. Her behavior is normal. Judgment and thought content normal.   Depression Screen PHQ 2/9 Scores 06/09/2017 06/09/2017 01/25/2017 01/25/2017  PHQ - 2 Score 0 0 0 0  PHQ- 9 Score 0 - 0 -   Assessment & Plan:   1. Annual physical exam Patient saw Dr Corinna Capra in 2017 for gynecological exam-will get those records. Patient would like to see a different gynecologist, referral placed today to [facility] 2. Colon cancer screening Overdue for  colonoscopy, refer back to Dr Henrene Pastor. 3. Gross hematuria Patient is having work up for this with urology.  4. B12 deficiency Stable.  5. Vitamin D deficiency 6. Egg allergy Can not get Flu shot.  HPI, Exam and A&P transcribed by Tiffany Kocher, RMA under direction and in the presence of Miguel Aschoff, MD. I have done the exam and reviewed the chart and it is accurate to the best of my knowledge. Development worker, community has been used and  any errors in dictation or transcription are unintentional. Miguel Aschoff M.D. [facility]

## 2017-06-11 ENCOUNTER — Encounter: Payer: Self-pay | Admitting: Internal Medicine

## 2017-06-14 ENCOUNTER — Ambulatory Visit
Admission: RE | Admit: 2017-06-14 | Discharge: 2017-06-14 | Disposition: A | Discharge status: Home / Self Care | Payer: Commercial Managed Care - PPO | Source: Ambulatory Visit | Attending: Urology | Admitting: Urology

## 2017-06-14 ENCOUNTER — Ambulatory Visit: Admission: RE | Admit: 2017-06-14 | Payer: Commercial Managed Care - PPO | Source: Ambulatory Visit

## 2017-06-14 DIAGNOSIS — R3129 Other microscopic hematuria: Secondary | ICD-10-CM

## 2017-06-14 DIAGNOSIS — I700 Atherosclerosis of aorta: Secondary | ICD-10-CM | POA: Diagnosis not present

## 2017-06-14 DIAGNOSIS — R1030 Lower abdominal pain, unspecified: Secondary | ICD-10-CM | POA: Diagnosis not present

## 2017-06-14 DIAGNOSIS — R911 Solitary pulmonary nodule: Secondary | ICD-10-CM | POA: Diagnosis not present

## 2017-06-14 MED ORDER — IOPAMIDOL (ISOVUE-300) INJECTION 61%
125.0000 mL | Freq: Once | INTRAVENOUS | Status: AC | PRN
  Administered 2017-06-14: 125 mL via INTRAVENOUS

## 2017-06-15 ENCOUNTER — Encounter: Payer: Commercial Managed Care - PPO | Admitting: Obstetrics and Gynecology

## 2017-06-15 ENCOUNTER — Other Ambulatory Visit: Payer: Self-pay | Admitting: Family Medicine

## 2017-06-17 ENCOUNTER — Encounter: Payer: Self-pay | Admitting: Urology

## 2017-06-17 ENCOUNTER — Ambulatory Visit: Payer: Commercial Managed Care - PPO | Admitting: Urology

## 2017-06-17 VITALS — BP 159/80 | HR 67 | Ht 67.0 in | Wt 130.4 lb

## 2017-06-17 DIAGNOSIS — R911 Solitary pulmonary nodule: Secondary | ICD-10-CM

## 2017-06-17 DIAGNOSIS — R3129 Other microscopic hematuria: Secondary | ICD-10-CM | POA: Diagnosis not present

## 2017-06-17 LAB — URINALYSIS, COMPLETE
BILIRUBIN UA: NEGATIVE
Glucose, UA: NEGATIVE
Ketones, UA: NEGATIVE
LEUKOCYTES UA: NEGATIVE
Nitrite, UA: NEGATIVE
PH UA: 7 (ref 5.0–7.5)
PROTEIN UA: NEGATIVE
Specific Gravity, UA: 1.01 (ref 1.005–1.030)
Urobilinogen, Ur: 0.2 mg/dL (ref 0.2–1.0)

## 2017-06-17 LAB — MICROSCOPIC EXAMINATION
BACTERIA UA: NONE SEEN
EPITHELIAL CELLS (NON RENAL): NONE SEEN /HPF (ref 0–10)
WBC, UA: NONE SEEN /hpf (ref 0–?)

## 2017-06-17 MED ORDER — CIPROFLOXACIN HCL 500 MG PO TABS: 500.0000 mg | ORAL_TABLET | Freq: Once | ORAL | Status: DC

## 2017-06-17 MED ORDER — LIDOCAINE HCL 2 % EX GEL: 1.0000 "application " | Freq: Once | CUTANEOUS | Status: DC

## 2017-06-17 NOTE — Progress Notes (Signed)
   06/17/17  CC: No chief complaint on file.   HPI: The patient is a 66 year old female presents today for completion of her microscopic hematuria workup.  CT urogram was negative for source of microscopic hematuria.  He did have an incidental finding of a 3 mm right lower lobe nodule that may need further imaging depending on the patient's risk stratification.  There were no vitals taken for this visit. NED. A&Ox3.   No respiratory distress   Abd soft, NT, ND Normal external genitalia with patent urethral meatus  Cystoscopy Procedure Note  Patient identification was confirmed, informed consent was obtained, and patient was prepped using Betadine solution.  Lidocaine jelly was administered per urethral meatus.    Preoperative abx where received prior to procedure.    Procedure: - Flexible cystoscope introduced, without any difficulty.   - Thorough search of the bladder revealed:    normal urethral meatus    normal urothelium    no stones    no ulcers     no tumors    no urethral polyps    no trabeculation  - Ureteral orifices were normal in position and appearance.  Post-Procedure: - Patient tolerated the procedure well  Assessment/ Plan:  1.  Microscopic hematuria Negative workup.  Follow-up in 1 year for repeat urinalysis.  2.  Lung nodule I discussed the incidental finding of a small lung nodule with the patient.  Per radiology recommendations, she may need repeat imaging of her chest in 1 year depending on her stratification.  She was given a copy of report.  She will discuss this further with her primary care provider.  She expressed an understanding.  Nickie Retort, MD

## 2017-06-21 ENCOUNTER — Encounter: Payer: Self-pay | Admitting: Obstetrics and Gynecology

## 2017-06-21 ENCOUNTER — Ambulatory Visit (INDEPENDENT_AMBULATORY_CARE_PROVIDER_SITE_OTHER): Payer: Commercial Managed Care - PPO | Admitting: Obstetrics and Gynecology

## 2017-06-21 VITALS — BP 126/67 | HR 83 | Ht 67.0 in | Wt 131.5 lb

## 2017-06-21 DIAGNOSIS — N952 Postmenopausal atrophic vaginitis: Secondary | ICD-10-CM

## 2017-06-21 DIAGNOSIS — N941 Unspecified dyspareunia: Secondary | ICD-10-CM

## 2017-06-21 MED ORDER — PRASTERONE 6.5 MG VA INST: 6.5000 mg | VAGINAL_INSERT | Freq: Every day | VAGINAL | 1 refills | Status: DC

## 2017-06-21 NOTE — Progress Notes (Addendum)
HPI:      Tracey Wolf is a 66 y.o. G2P1011 who LMP was No LMP recorded. Patient has had a hysterectomy.  Subjective:   She presents today for vaginal examination.  She is experience problems over the last 2 years with vaginal dryness and pain with intercourse.  This has become especially bad since she discontinued estrogen a few years ago.  She describes pain at the introitus and a burning sensation with intercourse.  She is wondering if she can restart estrogen.  (She was using Vivelle patch)  Previous hysterectomy for uterine fibroids and endometriosis.  Patient does have a history of abnormal cervical cytology but got vaginal cuff Pap smears for greater than 15 years after her hysterectomy with no abnormals.    Hx: The following portions of the patient's history were reviewed and updated as appropriate:             She  has a past medical history of B12 deficiency and Hyperlipidemia. She does not have any pertinent problems on file. She  has a past surgical history that includes Abdominal hysterectomy; Tubal ligation; and Dilation and curettage of uterus. Her family history includes COPD in her father; Diabetes in her mother; Heart attack in her mother; Hyperlipidemia in her brother, daughter, and sister; Kidney Stones in her brother; Kidney disease in her brother; Pulmonary embolism in her father. She  reports that she quit smoking about 26 years ago. Her smoking use included cigarettes. She has a 10.00 pack-year smoking history. she has never used smokeless tobacco. She reports that she drinks alcohol. She reports that she does not use drugs. She is allergic to bee venom; cefaclor; eggs or egg-derived products; iodinated diagnostic agents; iohexol; doxycycline; and latex.       Review of Systems:  Review of Systems  Constitutional: Denied constitutional symptoms, night sweats, recent illness, fatigue, fever, insomnia and weight loss.  Eyes: Denied eye symptoms, eye pain,  photophobia, vision change and visual disturbance.  Ears/Nose/Throat/Neck: Denied ear, nose, throat or neck symptoms, hearing loss, nasal discharge, sinus congestion and sore throat.  Cardiovascular: Denied cardiovascular symptoms, arrhythmia, chest pain/pressure, edema, exercise intolerance, orthopnea and palpitations.  Respiratory: Denied pulmonary symptoms, asthma, pleuritic pain, productive sputum, cough, dyspnea and wheezing.  Gastrointestinal: Denied, gastro-esophageal reflux, melena, nausea and vomiting.  Genitourinary: See HPI for additional information.  Musculoskeletal: Denied musculoskeletal symptoms, stiffness, swelling, muscle weakness and myalgia.  Dermatologic: Denied dermatology symptoms, rash and scar.  Neurologic: Denied neurology symptoms, dizziness, headache, neck pain and syncope.  Psychiatric: Denied psychiatric symptoms, anxiety and depression.  Endocrine: Denied endocrine symptoms including hot flashes and night sweats.   Meds:   Current Outpatient Medications on File Prior to Visit  Medication Sig Dispense Refill  . cyanocobalamin (,VITAMIN B-12,) 1000 MCG/ML injection use as directed 10 mL 12  . EPINEPHrine (EPIPEN 2-PAK) 0.3 mg/0.3 mL IJ SOAJ injection EPIPEN 2-PAK, 0.3MG /0.3ML (Injection Solution Auto-injector)  1 (one) Soln Auto-inj Soln Auto-inj Inject as needed for 0 days  Quantity: 1;  Refills: 12   Ordered :19-Jul-2014  Miguel Aschoff MD Miguel Aschoff MD;  Started [phone] Active Comments: Medication taken as needed. 1 Device 5  . SAFETY-LOK 3CC SYR 25GX5/8" 25G X 5/8" 3 ML MISC use as directed 50 each 11  . Vitamin D, Ergocalciferol, (DRISDOL) 50000 units CAPS capsule take 1 capsule by mouth every week 4 capsule 12   Current Facility-Administered Medications on File Prior to Visit  Medication Dose Route Frequency Provider Last Rate Last  Dose  . ciprofloxacin (CIPRO) tablet 500 mg  500 mg Oral Once Nickie Retort, MD      . lidocaine  (XYLOCAINE) 2 % jelly 1 application  1 application Urethral Once Nickie Retort, MD        Objective:     Vitals:   06/21/17 1453  BP: 126/67  Pulse: 83              Physical examination   Pelvic:   Vulva: Normal appearance.  No lesions.  Very small introitus.  Tissue bridge present posteriorly over the perineal body.  Painful to palpation in this area.  Vagina: No lesions -significant vaginal atrophy present consistent with hypo-estrogen of menopause.  Support: Normal pelvic support.  Urethra No masses tenderness or scarring.  Small urethral caruncle present  Meatus Normal size without lesions or prolapse.     Anus: Normal exam.  No lesions.  Perineum: Normal exam.  No lesions.        Bimanual      Adnexae: No masses.  Non-tender to palpation.  Cul-de-sac: Negative for abnormality.     Assessment:    G2P1011 Patient Active Problem List   Diagnosis Date Noted  . Cough 08/21/2016  . Endometriosis 04/25/2015  . Personal history of reproductive and obstetrical problems 04/25/2015  . HLD (hyperlipidemia) 04/25/2015  . B12 deficiency 04/25/2015     1. Vaginal atrophy   2. Dyspareunia in female     I believe that the vast majority of her issues are caused by hypo estrogen of menopause.  The small introitus may also have been affected by lack of estrogen.   Plan:            1.  She was interested in restarting Vivelle patch but is willing to try a vaginal preparation first.  We discussed the risks and benefits of oral versus patch versus intravaginal preparations.  2.  Intra-Rosa as directed.   Orders No orders of the defined types were placed in this encounter.    Meds ordered this encounter  Medications  . Prasterone (INTRAROSA) 6.5 MG INST    Sig: Place 6.5 mg at bedtime vaginally.    Dispense:  30 each    Refill:  1      F/U  Return in about 2 months (around 08/21/2017). I spent 32 minutes with this patient of which greater than 50% was spent  discussing ERT, vaginal estrogen, pain with intercourse, postcoital bleeding.  All of her questions have been answered.  This topic is quite extensive regarding use of estrogen in menopause and we have covered many aspects of it during this visit.  Finis Bud, M.D. 06/21/2017 4:24 PM

## 2017-08-11 ENCOUNTER — Telehealth: Payer: Self-pay | Admitting: *Deleted

## 2017-08-11 ENCOUNTER — Ambulatory Visit (AMBULATORY_SURGERY_CENTER): Payer: Self-pay | Admitting: *Deleted

## 2017-08-11 ENCOUNTER — Other Ambulatory Visit: Payer: Self-pay

## 2017-08-11 VITALS — Ht 67.0 in | Wt 130.0 lb

## 2017-08-11 DIAGNOSIS — Z1211 Encounter for screening for malignant neoplasm of colon: Secondary | ICD-10-CM

## 2017-08-11 MED ORDER — NA SULFATE-K SULFATE-MG SULF 17.5-3.13-1.6 GM/177ML PO SOLN: 1.0000 | Freq: Once | ORAL | 0 refills | Status: AC

## 2017-08-11 NOTE — Progress Notes (Signed)
Pt has  egg allergy- Sev N/V. Will send TE to [employer] CRNA to make aware-  No soy allergy known to patient  No issues with past sedation with any surgeries  or procedures, no intubation problems  No diet pills per patient No home 02 use per patient  No blood thinners per patient  Pt denies issues with constipation  No A fib or A flutter  EMMI video sent to pt's e mail - pt declined  Pt given $15 coupon on line for prep - informed will have a co pay more than likely

## 2017-08-11 NOTE — Telephone Encounter (Signed)
John,  I saw this patient today in [place]- she states she gets physically ill with severe nausea/ vomiting with eggs- she states she has been told with certain sedations she may have issues - she told me she can eat a cake with a few eggs but like a pound cake or pies  She cannot tolerate-  Cans he have propofol or will she needs F/V  Please advise  Thanks Lelan Pons

## 2017-08-11 NOTE — Telephone Encounter (Signed)
Marie,  This pt is cleared for anesthetic care at [facility].  Thanks,  Jakhari Space 

## 2017-08-12 NOTE — Telephone Encounter (Signed)
Pt informed- she is still very concerned that she has this allergy to eggs and she does not want to get sick - she will discuss with CRNa the day of her procedure  Lelan Pons PV

## 2017-08-18 ENCOUNTER — Encounter: Payer: Self-pay | Admitting: Internal Medicine

## 2017-08-20 ENCOUNTER — Encounter: Payer: Commercial Managed Care - PPO | Admitting: Obstetrics and Gynecology

## 2017-08-25 ENCOUNTER — Ambulatory Visit (AMBULATORY_SURGERY_CENTER): Payer: Commercial Managed Care - PPO | Admitting: Internal Medicine

## 2017-08-25 ENCOUNTER — Other Ambulatory Visit: Payer: Self-pay

## 2017-08-25 ENCOUNTER — Encounter: Payer: Self-pay | Admitting: Internal Medicine

## 2017-08-25 VITALS — BP 120/65 | HR 75 | Temp 96.8°F | Resp 12 | Ht 67.0 in | Wt 130.0 lb

## 2017-08-25 DIAGNOSIS — Z1212 Encounter for screening for malignant neoplasm of rectum: Secondary | ICD-10-CM

## 2017-08-25 DIAGNOSIS — Z1211 Encounter for screening for malignant neoplasm of colon: Secondary | ICD-10-CM

## 2017-08-25 MED ORDER — SODIUM CHLORIDE 0.9 % IV SOLN: 500.0000 mL | Freq: Once | INTRAVENOUS | Status: AC

## 2017-08-25 NOTE — Patient Instructions (Signed)
YOU HAD AN ENDOSCOPIC PROCEDURE TODAY AT [facility]:   Refer to the procedure report that was given to you for any specific questions about what was found during the examination.  If the procedure report does not answer your questions, please call your gastroenterologist to clarify.  If you requested that your care partner not be given the details of your procedure findings, then the procedure report has been included in a sealed envelope for you to review at your convenience later.  YOU SHOULD EXPECT: Some feelings of bloating in the abdomen. Passage of more gas than usual.  Walking can help get rid of the air that was put into your GI tract during the procedure and reduce the bloating. If you had a lower endoscopy (such as a colonoscopy or flexible sigmoidoscopy) you may notice spotting of blood in your stool or on the toilet paper. If you underwent a bowel prep for your procedure, you may not have a normal bowel movement for a few days.  Please Note:  You might notice some irritation and congestion in your nose or some drainage.  This is from the oxygen used during your procedure.  There is no need for concern and it should clear up in a day or so.  SYMPTOMS TO REPORT IMMEDIATELY:   Following lower endoscopy (colonoscopy or flexible sigmoidoscopy):  Excessive amounts of blood in the stool  Significant tenderness or worsening of abdominal pains  Swelling of the abdomen that is new, acute  Fever of 100F or higher    For urgent or emergent issues, a gastroenterologist can be reached at any hour by calling [phone].   DIET:  We do recommend a small meal at first, but then you may proceed to your regular diet.  Drink plenty of fluids but you should avoid alcoholic beverages for 24 hours.  ACTIVITY:  You should plan to take it easy for the rest of today and you should NOT DRIVE or use heavy machinery until tomorrow (because of the sedation medicines used during the test).     FOLLOW UP: Our staff will call the number listed on your records the next business day following your procedure to check on you and address any questions or concerns that you may have regarding the information given to you following your procedure. If we do not reach you, we will leave a message.  However, if you are feeling well and you are not experiencing any problems, there is no need to return our call.  We will assume that you have returned to your regular daily activities without incident.  If any biopsies were taken you will be contacted by phone or by letter within the next 1-3 weeks.  Please call us at [phone] if you have not heard about the biopsies in 3 weeks.    SIGNATURES/CONFIDENTIALITY: You and/or your care partner have signed paperwork which will be entered into your electronic medical record.  These signatures attest to the fact that that the information above on your After Visit Summary has been reviewed and is understood.  Full responsibility of the confidentiality of this discharge information lies with you and/or your care-partner. .  INFORMATION ON DIVERTICULOSIS AND HEMORRHOIDS GIVEN TO YOU TODAY

## 2017-08-25 NOTE — Progress Notes (Signed)
To recovery, report to RN, VSS. 

## 2017-08-25 NOTE — Op Note (Signed)
[facility] Patient Name: Tracey Wolf Procedure Date: 08/25/2017 10:12 AM MRN: [id number] Endoscopist: Docia Chuck. Henrene Pastor , MD Age: 67 Referring MD:  Date of Birth: Jun 22, 1951 Gender: Female Account #: [id number] Procedure:                Colonoscopy Indications:              Screening for colorectal malignant neoplasm.                            Previous examinations 2005, 1998 Medicines:                Monitored Anesthesia Care Procedure:                Pre-Anesthesia Assessment:                           - Prior to the procedure, a History and Physical                            was performed, and patient medications and                            allergies were reviewed. The patient's tolerance of                            previous anesthesia was also reviewed. The risks                            and benefits of the procedure and the sedation                            options and risks were discussed with the patient.                            All questions were answered, and informed consent                            was obtained. Prior Anticoagulants: The patient has                            taken no previous anticoagulant or antiplatelet                            agents. ASA Grade Assessment: II - A patient with                            mild systemic disease. After reviewing the risks                            and benefits, the patient was deemed in                            satisfactory condition to undergo the procedure.  After obtaining informed consent, the colonoscope                            was passed under direct vision. Throughout the                            procedure, the patient's blood pressure, pulse, and                            oxygen saturations were monitored continuously. The                            Colonoscope was introduced through the anus and                            advanced to the the cecum,  identified by                            appendiceal orifice and ileocecal valve. The                            ileocecal valve, appendiceal orifice, and rectum                            were photographed. The quality of the bowel                            preparation was excellent. The colonoscopy was                            performed without difficulty. The patient tolerated                            the procedure well. The bowel preparation used was                            SUPREP. Scope In: 10:21:11 AM Scope Out: 10:35:01 AM Scope Withdrawal Time: 0 hours 9 minutes 47 seconds  Total Procedure Duration: 0 hours 13 minutes 50 seconds  Findings:                 Diverticula were found in the sigmoid colon.                           Internal hemorrhoids were found during retroflexion.                           The exam was otherwise without abnormality on                            direct and retroflexion views. Complications:            No immediate complications. Estimated blood loss:                            None. Estimated  Blood Loss:     Estimated blood loss: none. Impression:               - Diverticulosis in the sigmoid colon.                           - Internal hemorrhoids.                           - The examination was otherwise normal on direct                            and retroflexion views.                           - No specimens collected. Recommendation:           - Repeat colonoscopy in 10 years for screening                            purposes.                           - Patient has a contact number available for                            emergencies. The signs and symptoms of potential                            delayed complications were discussed with the                            patient. Return to normal activities tomorrow.                            Written discharge instructions were provided to the                            patient.                            - Resume previous diet.                           - Continue present medications. Docia Chuck. Henrene Pastor, MD 08/25/2017 10:38:49 AM This report has been signed electronically.

## 2017-08-25 NOTE — Progress Notes (Signed)
Pt's states no medical or surgical changes since previsit or office visit. 

## 2017-08-26 ENCOUNTER — Telehealth: Payer: Self-pay

## 2017-08-26 NOTE — Telephone Encounter (Signed)
  Follow up Call-  Call back number 08/25/2017  Post procedure Call Back phone  # [phone]  Permission to leave phone message Yes  Some recent data might be hidden     Patient questions:  Do you have a fever, pain , or abdominal swelling? No. Pain Score  0 *  Have you tolerated food without any problems? Yes.    Have you been able to return to your normal activities? Yes.    Do you have any questions about your discharge instructions: Diet   No. Medications  No. Follow up visit  No.  Do you have questions or concerns about your Care? No.  Actions: * If pain score is 4 or above: No action needed, pain <4.

## 2017-09-03 ENCOUNTER — Encounter: Payer: Commercial Managed Care - PPO | Admitting: Obstetrics and Gynecology

## 2017-09-09 ENCOUNTER — Encounter: Payer: Commercial Managed Care - PPO | Admitting: Obstetrics and Gynecology

## 2017-09-27 ENCOUNTER — Encounter: Payer: Commercial Managed Care - PPO | Admitting: Obstetrics and Gynecology

## 2017-09-28 ENCOUNTER — Ambulatory Visit: Payer: Commercial Managed Care - PPO | Admitting: Obstetrics and Gynecology

## 2017-09-28 ENCOUNTER — Encounter: Payer: Self-pay | Admitting: Obstetrics and Gynecology

## 2017-09-28 VITALS — BP 145/84 | HR 77 | Ht 67.0 in | Wt 135.9 lb

## 2017-09-28 DIAGNOSIS — N941 Unspecified dyspareunia: Secondary | ICD-10-CM | POA: Diagnosis not present

## 2017-09-28 DIAGNOSIS — N952 Postmenopausal atrophic vaginitis: Secondary | ICD-10-CM | POA: Diagnosis not present

## 2017-09-28 MED ORDER — ESTRADIOL 0.05 MG/24HR TD PTTW: 1.0000 | MEDICATED_PATCH | TRANSDERMAL | 3 refills | Status: DC

## 2017-09-28 NOTE — Progress Notes (Signed)
HPI:      [name] is a 67 y.o. G2P1011 who LMP was No LMP recorded. Patient has had a hysterectomy.  Subjective:   She presents today for follow-up of vaginal atrophy and dyspareunia.  Patient tried intra-Rosa and had which she describes as an allergic reaction with external swelling and itching.  She tried it on 2 separate occasions and at the same thing happened both times. She previously has taken Vivelle up until 4 years ago.  Was very happy on Vivelle and is requesting a restart. Of significant note she has previously used intra-vaginal estrogen without success.    Hx: The following portions of the patient's history were reviewed and updated as appropriate:             She  has a past medical history of B12 deficiency and Hyperlipidemia. She does not have any pertinent problems on file. She  has a past surgical history that includes Abdominal hysterectomy; Tubal ligation; Dilation and curettage of uterus; Colonoscopy; and Cervical cone biopsy. Her family history includes COPD in her father; Diabetes in her mother; Heart attack in her mother; Hyperlipidemia in her brother, daughter, and sister; Kidney Stones in her brother; Kidney disease in her brother; Pulmonary embolism in her father. She  reports that she quit smoking about 27 years ago. Her smoking use included cigarettes. She has a 10.00 pack-year smoking history. she has never used smokeless tobacco. She reports that she drinks alcohol. She reports that she does not use drugs. She has a current medication list which includes the following prescription(s): cyanocobalamin, vitamin d (ergocalciferol), epinephrine, estradiol, ibuprofen, prasterone, and safety-lok 3cc syr 25gx5/8", and the following Facility-Administered Medications: sodium chloride, ciprofloxacin, and lidocaine. She is allergic to bee venom; cefaclor; eggs or egg-derived products; iodinated diagnostic agents; iohexol; doxycycline; and latex.       Review of  Systems:  Review of Systems  Constitutional: Denied constitutional symptoms, night sweats, recent illness, fatigue, fever, insomnia and weight loss.  Eyes: Denied eye symptoms, eye pain, photophobia, vision change and visual disturbance.  Ears/Nose/Throat/Neck: Denied ear, nose, throat or neck symptoms, hearing loss, nasal discharge, sinus congestion and sore throat.  Cardiovascular: Denied cardiovascular symptoms, arrhythmia, chest pain/pressure, edema, exercise intolerance, orthopnea and palpitations.  Respiratory: Denied pulmonary symptoms, asthma, pleuritic pain, productive sputum, cough, dyspnea and wheezing.  Gastrointestinal: Denied, gastro-esophageal reflux, melena, nausea and vomiting.  Genitourinary:  See HPI for additional information.  Musculoskeletal: Denied musculoskeletal symptoms, stiffness, swelling, muscle weakness and myalgia.  Dermatologic: Denied dermatology symptoms, rash and scar.  Neurologic: Denied neurology symptoms, dizziness, headache, neck pain and syncope.  Psychiatric: Denied psychiatric symptoms, anxiety and depression.  Endocrine: Denied endocrine symptoms including hot flashes and night sweats.   Meds:   Current Outpatient Medications on File Prior to Visit  Medication Sig Dispense Refill  . cyanocobalamin (,VITAMIN B-12,) 1000 MCG/ML injection use as directed 10 mL 12  . Vitamin D, Ergocalciferol, (DRISDOL) 50000 units CAPS capsule take 1 capsule by mouth every week 4 capsule 12  . EPINEPHrine (EPIPEN 2-PAK) 0.3 mg/0.3 mL IJ SOAJ injection EPIPEN 2-PAK, 0.3MG /0.3ML (Injection Solution Auto-injector)  1 (one) Soln Auto-inj Soln Auto-inj Inject as needed for 0 days  Quantity: 1;  Refills: 12   Ordered :19-Jul-2014  Miguel Aschoff MD Miguel Aschoff MD;  Started [phone] Active Comments: Medication taken as needed. (Patient not taking: Reported on 08/25/2017) 1 Device 5  . ibuprofen (ADVIL,MOTRIN) 200 MG tablet Take 200 mg by mouth every 6 (six)  hours as needed.    . Prasterone (INTRAROSA) 6.5 MG INST Place 6.5 mg at bedtime vaginally. (Patient not taking: Reported on 08/25/2017) 30 each 1  . SAFETY-LOK 3CC SYR 25GX5/8" 25G X 5/8" 3 ML MISC use as directed (Patient not taking: Reported on 08/25/2017) 50 each 11   Current Facility-Administered Medications on File Prior to Visit  Medication Dose Route Frequency Provider Last Rate Last Dose  . 0.9 %  sodium chloride infusion  500 mL Intravenous Once Irene Shipper, MD      . ciprofloxacin (CIPRO) tablet 500 mg  500 mg Oral Once Nickie Retort, MD      . lidocaine (XYLOCAINE) 2 % jelly 1 application  1 application Urethral Once Nickie Retort, MD        Objective:     Vitals:   09/28/17 1000  BP: (!) 145/84  Pulse: 77                Assessment:    G2P1011 Patient Active Problem List   Diagnosis Date Noted  . Cough 08/21/2016  . Endometriosis 04/25/2015  . Personal history of reproductive and obstetrical problems 04/25/2015  . HLD (hyperlipidemia) 04/25/2015  . B12 deficiency 04/25/2015     1. Vaginal atrophy   2. Dyspareunia in female     The patient has failed intra-Rosa and is specifically requesting a restart of Vivelle.   Plan:            1.  HRT I have discussed HRT with the patient in detail.  The risk/benefits of it were reviewed.  She understands that during menopause Estrogen decreases dramatically and that this results in an increased risk of cardiovascular disease as well as osteoporosis.  We have also discussed the fact that hot flashes often result from a decrease in Estrogen, and that by replacing Estrogen, they can often be alleviated.  We have discussed skin, vaginal and urinary tract changes that may also take place from this drop in Estrogen.  Emotional changes have also been linked to Estrogen and we have briefly discussed this.  The benefits of HRT including decrease in hot flashes, vaginal dryness, and osteoporosis were discussed.  The  emotional benefit and a possible change in her cardiovascular risk profile was also reviewed.  The risks associated with Hormone Replacement Therapy were also reviewed.  The use of unopposed Estrogen and its relationship to endometrial cancer was discussed.  The addition of Progesterone and its beneficial effect on endometrial cancer was also noted.  The fact that there has been no consistent definitive studies showing an increase in breast cancer in women who use HRT was discussed with the patient.  The possible side effects including breast tenderness, fluid retention, mood changes and vaginal bleeding were discussed.  The patient was informed that this is an elective medication and that she may choose not to take Hormone Replacement Therapy.  Literature on HRT was given, and I believe that after answering all of the patient's questions, she has an adequate and informed understanding of HRT.  Special emphasis on the WHI study, as well as several studies since that pertaining to the risks and benefits of estrogen replacement therapy were compared.  The possible limitations of these studies were discussed including the age stratification of the WHI study.  The possible role of Progesterone in these studies was discussed in detail.  I believe that the patient has an informed knowledge of the risks and benefits of HRT.  I  have specifically discussed WHI findings and current updates.  Different type of hormone formulation and methods of taking hormone replacement therapy discussed.  We have specifically discussed the increased risk of someone who has been in menopause for varying lengths of time.  Of note she has been without hormones in menopause for approximately 4 years.  The risks of stroke and heart disease was specifically discussed in detail.  She has decided that she would like to take this small risk for the suppose it benefits that she received while on Vivelle. Orders No orders of the defined types were  placed in this encounter.    Meds ordered this encounter  Medications  . estradiol (VIVELLE-DOT) 0.05 MG/24HR patch    Sig: Place 1 patch (0.05 mg total) onto the skin 2 (two) times a week.    Dispense:  24 patch    Refill:  3      F/U  Return in about 3 months (around 12/26/2017). I spent 19 minutes with this patient of which greater than 50% was spent discussing her current condition, previous Vivelle use, failure of intra-Rosa, WHI study and risks and benefits of ERT in someone her age.  Finis Bud, M.D. 09/28/2017 10:59 AM

## 2017-12-28 ENCOUNTER — Encounter: Payer: Commercial Managed Care - PPO | Admitting: Obstetrics and Gynecology

## 2018-01-04 ENCOUNTER — Encounter: Payer: Self-pay | Admitting: Obstetrics and Gynecology

## 2018-01-04 ENCOUNTER — Ambulatory Visit: Payer: Commercial Managed Care - PPO | Admitting: Obstetrics and Gynecology

## 2018-01-04 VITALS — BP 120/69 | HR 66 | Ht 67.0 in | Wt 130.9 lb

## 2018-01-04 DIAGNOSIS — Z79890 Hormone replacement therapy (postmenopausal): Secondary | ICD-10-CM

## 2018-01-04 NOTE — Progress Notes (Signed)
Pt is present today as a 3 month  follow up from HRT patches. Pt stated that the patches are working well.

## 2018-01-04 NOTE — Progress Notes (Signed)
HPI:      [name] is a 67 y.o. G2P1011 who LMP was No LMP recorded. Patient has had a hysterectomy.  Subjective:   She presents today for follow-up of her hormone replacement.  She is using Vivelle-Dot twice weekly.  She says she feels much better on it.  No issues with vaginal dryness hot flashes etc.  Patient states she is sleeping better and has more energy.  She would like to continue.    Hx: The following portions of the patient's history were reviewed and updated as appropriate:             She  has a past medical history of B12 deficiency and Hyperlipidemia. She does not have any pertinent problems on file. She  has a past surgical history that includes Abdominal hysterectomy; Tubal ligation; Dilation and curettage of uterus; Colonoscopy; and Cervical cone biopsy. Her family history includes COPD in her father; Diabetes in her mother; Heart attack in her mother; Hyperlipidemia in her brother, daughter, and sister; Kidney Stones in her brother; Kidney disease in her brother; Pulmonary embolism in her father. She  reports that she quit smoking about 27 years ago. Her smoking use included cigarettes. She has a 10.00 pack-year smoking history. She has never used smokeless tobacco. She reports that she drinks alcohol. She reports that she does not use drugs. She has a current medication list which includes the following prescription(s): cyanocobalamin, epinephrine, estradiol, ibuprofen, and vitamin d (ergocalciferol), and the following Facility-Administered Medications: sodium chloride, ciprofloxacin, and lidocaine. She is allergic to bee venom; cefaclor; eggs or egg-derived products; iodinated diagnostic agents; iohexol; doxycycline; and latex.       Review of Systems:  Review of Systems  Constitutional: Denied constitutional symptoms, night sweats, recent illness, fatigue, fever, insomnia and weight loss.  Eyes: Denied eye symptoms, eye pain, photophobia, vision change and  visual disturbance.  Ears/Nose/Throat/Neck: Denied ear, nose, throat or neck symptoms, hearing loss, nasal discharge, sinus congestion and sore throat.  Cardiovascular: Denied cardiovascular symptoms, arrhythmia, chest pain/pressure, edema, exercise intolerance, orthopnea and palpitations.  Respiratory: Denied pulmonary symptoms, asthma, pleuritic pain, productive sputum, cough, dyspnea and wheezing.  Gastrointestinal: Denied, gastro-esophageal reflux, melena, nausea and vomiting.  Genitourinary: Denied genitourinary symptoms including symptomatic vaginal discharge, pelvic relaxation issues, and urinary complaints.  Musculoskeletal: Denied musculoskeletal symptoms, stiffness, swelling, muscle weakness and myalgia.  Dermatologic: Denied dermatology symptoms, rash and scar.  Neurologic: Denied neurology symptoms, dizziness, headache, neck pain and syncope.  Psychiatric: Denied psychiatric symptoms, anxiety and depression.  Endocrine: Denied endocrine symptoms including hot flashes and night sweats.   Meds:   Current Outpatient Medications on File Prior to Visit  Medication Sig Dispense Refill  . cyanocobalamin (,VITAMIN B-12,) 1000 MCG/ML injection use as directed 10 mL 12  . EPINEPHrine (EPIPEN 2-PAK) 0.3 mg/0.3 mL IJ SOAJ injection EPIPEN 2-PAK, 0.3MG /0.3ML (Injection Solution Auto-injector)  1 (one) Soln Auto-inj Soln Auto-inj Inject as needed for 0 days  Quantity: 1;  Refills: 12   Ordered :19-Jul-2014  Miguel Aschoff MD Miguel Aschoff MD;  Started [phone] Active Comments: Medication taken as needed. 1 Device 5  . estradiol (VIVELLE-DOT) 0.05 MG/24HR patch Place 1 patch (0.05 mg total) onto the skin 2 (two) times a week. 24 patch 3  . ibuprofen (ADVIL,MOTRIN) 200 MG tablet Take 200 mg by mouth every 6 (six) hours as needed.    . Vitamin D, Ergocalciferol, (DRISDOL) 50000 units CAPS capsule take 1 capsule by mouth every week 4 capsule 12  Current Facility-Administered  Medications on File Prior to Visit  Medication Dose Route Frequency Provider Last Rate Last Dose  . 0.9 %  sodium chloride infusion  500 mL Intravenous Once Irene Shipper, MD      . ciprofloxacin (CIPRO) tablet 500 mg  500 mg Oral Once Nickie Retort, MD      . lidocaine (XYLOCAINE) 2 % jelly 1 application  1 application Urethral Once Nickie Retort, MD        Objective:     Vitals:   01/04/18 0958  BP: 120/69  Pulse: 66                Assessment:    G2P1011 Patient Active Problem List   Diagnosis Date Noted  . Cough 08/21/2016  . Endometriosis 04/25/2015  . Personal history of reproductive and obstetrical problems 04/25/2015  . HLD (hyperlipidemia) 04/25/2015  . B12 deficiency 04/25/2015     1. Postmenopausal hormone therapy     Patient doing well very happy on HRT.   Plan:            1.  Continue Vivelle-Dot  2.  Patient to follow-up for annual examination Pap smear and mammogram. Orders No orders of the defined types were placed in this encounter.   No orders of the defined types were placed in this encounter.     F/U  Return for Annual Physical. I spent 17 minutes involved in the care of this patient of which greater than 50% was spent discussing ERT, changes to mammograms, signs and symptoms of hypoestrogenism.  Finis Bud, M.D. 01/04/2018 10:52 AM

## 2018-01-10 ENCOUNTER — Encounter: Payer: Self-pay | Admitting: Family Medicine

## 2018-01-10 ENCOUNTER — Ambulatory Visit: Payer: Commercial Managed Care - PPO | Admitting: Family Medicine

## 2018-01-10 VITALS — BP 122/64 | Temp 98.3°F | Resp 16 | Wt 133.0 lb

## 2018-01-10 DIAGNOSIS — E559 Vitamin D deficiency, unspecified: Secondary | ICD-10-CM | POA: Diagnosis not present

## 2018-01-10 DIAGNOSIS — E538 Deficiency of other specified B group vitamins: Secondary | ICD-10-CM

## 2018-01-10 DIAGNOSIS — R310 Gross hematuria: Secondary | ICD-10-CM

## 2018-01-10 LAB — POCT URINALYSIS DIPSTICK
BILIRUBIN UA: NEGATIVE
Glucose, UA: NEGATIVE
Ketones, UA: NEGATIVE
LEUKOCYTES UA: NEGATIVE
NITRITE UA: NEGATIVE
PH UA: 7.5 (ref 5.0–8.0)
PROTEIN UA: NEGATIVE
Spec Grav, UA: <=1.005 — AB (ref 1.010–1.025)
UROBILINOGEN UA: 0.2 U/dL

## 2018-01-10 NOTE — Progress Notes (Signed)
[name] Female    DOB: 1950-09-09   67 y.o.   MRN: [id number] Visit Date: 01/10/2018  Today's Provider: Wilhemena Durie, MD   Chief Complaint  Patient presents with  . Vitamin B12 deficiency   Subjective:    HPI Patient comes in today for a 6 month follow up. She feels well today with no complaints.   She reports that she is doing well with the self administering B12 injections. She denies any side effects.   Since the last visit she has seen GYN. She is now taking estradiol patches and reports good symptom control.     Allergies  Allergen Reactions  . Bee Venom   . Cefaclor   . Eggs Or Egg-Derived Products Nausea And Vomiting  . Iodinated Diagnostic Agents   . Iohexol      Desc: had severe reaction in october '05   swelling, sob and throat closing   . Doxycycline Rash  . Latex Rash     Current Outpatient Medications:  .  cyanocobalamin (,VITAMIN B-12,) 1000 MCG/ML injection, use as directed, Disp: 10 mL, Rfl: 12 .  EPINEPHrine (EPIPEN 2-PAK) 0.3 mg/0.3 mL IJ SOAJ injection, EPIPEN 2-PAK, 0.3MG /0.3ML (Injection Solution Auto-injector)  1 (one) Soln Auto-inj Soln Auto-inj Inject as needed for 0 days  Quantity: 1;  Refills: 12   Ordered :19-Jul-2014  Miguel Aschoff MD Miguel Aschoff MD;  Started [phone] Active Comments: Medication taken as needed., Disp: 1 Device, Rfl: 5 .  estradiol (VIVELLE-DOT) 0.05 MG/24HR patch, Place 1 patch (0.05 mg total) onto the skin 2 (two) times a week., Disp: 24 patch, Rfl: 3 .  ibuprofen (ADVIL,MOTRIN) 200 MG tablet, Take 200 mg by mouth every 6 (six) hours as needed., Disp: , Rfl:  .  Vitamin D, Ergocalciferol, (DRISDOL) 50000 units CAPS capsule, take 1 capsule by mouth every week, Disp: 4 capsule, Rfl: 12  Current Facility-Administered Medications:  .  0.9 %  sodium chloride infusion, 500 mL, Intravenous, Once, Irene Shipper, MD .  ciprofloxacin (CIPRO) tablet 500 mg, 500 mg, Oral, Once, Nickie Retort, MD .  lidocaine (XYLOCAINE) 2 % jelly 1 application, 1 application, Urethral, Once, Nickie Retort, MD  Review of Systems  Constitutional: Negative for activity change, appetite change, chills, diaphoresis, fatigue, fever and unexpected weight change.  Endocrine: Negative for cold intolerance, heat intolerance, polydipsia, polyphagia and polyuria.  Musculoskeletal: Negative for arthralgias, back pain and myalgias.  Neurological: Negative for dizziness, tremors, weakness, light-headedness, numbness and headaches.  Hematological: Negative for adenopathy. Does not bruise/bleed easily.  Psychiatric/Behavioral: Negative for agitation, decreased concentration, dysphoric mood, hallucinations, self-injury, sleep disturbance and suicidal ideas. The patient is not nervous/anxious and is not hyperactive.     Social History   Tobacco Use  . Smoking status: Former Smoker    Packs/day: 0.50    Years: 20.00    Pack years: 10.00    Types: Cigarettes    Last attempt to quit: 08/03/1990    Years since quitting: 27.4  . Smokeless tobacco: Never Used  Substance Use Topics  . Alcohol use: Yes    Alcohol/week: 0.0 oz    Comment: 2 per month   Objective:   BP 122/64 (BP Location: Right Arm, Patient Position: Sitting, Cuff Size: Normal)   Temp 98.3 F (36.8 C)   Resp 16   Wt 133 lb (60.3 kg)   BMI 20.83 kg/m  Vitals:   01/10/18 1027  BP: 122/64  Resp:  16  Temp: 98.3 F (36.8 C)  Weight: 133 lb (60.3 kg)     Physical Exam  Constitutional: She is oriented to person, place, and time. She appears well-developed and well-nourished.  HENT:  Head: Normocephalic and atraumatic.  Right Ear: External ear normal.  Left Ear: External ear normal.  Nose: Nose normal.  Eyes: Conjunctivae are normal. No scleral icterus.  Neck: No thyromegaly present.  Cardiovascular: Normal rate, regular rhythm and normal heart sounds.  Pulmonary/Chest: Effort normal and breath sounds normal.  Abdominal:  Soft.  Musculoskeletal: She exhibits no edema.  Neurological: She is alert and oriented to person, place, and time.  Skin: Skin is warm and dry.  Psychiatric: She has a normal mood and affect. Her behavior is normal. Judgment and thought content normal.        Assessment & Plan:     1. B12 deficiency   2. Vitamin D deficiency   3.  hematuria Microscopic exam WNL. - POCT urinalysis dipstick  I have done the exam and reviewed the chart and it is accurate to the best of my knowledge. Development worker, community has been used and  any errors in dictation or transcription are unintentional. Miguel Aschoff M.D. [place], MD  [facility]

## 2018-01-11 ENCOUNTER — Other Ambulatory Visit: Payer: Self-pay | Admitting: Family Medicine

## 2018-01-11 ENCOUNTER — Telehealth: Payer: Self-pay

## 2018-01-11 DIAGNOSIS — E538 Deficiency of other specified B group vitamins: Secondary | ICD-10-CM

## 2018-01-11 NOTE — Telephone Encounter (Signed)
Left message to call back  

## 2018-01-11 NOTE — Telephone Encounter (Signed)
-----   Message from Jerrol Banana., MD sent at 01/10/2018  5:04 PM EDT ----- Microscopic negative.

## 2018-01-14 NOTE — Telephone Encounter (Signed)
Pt advised.   Thanks,   -Sashia Campas  

## 2018-02-24 ENCOUNTER — Other Ambulatory Visit: Payer: Self-pay | Admitting: Family Medicine

## 2018-02-24 MED ORDER — "SYRINGE/NEEDLE (DISP) 25G X 5/8"" 3 ML MISC": 1.0000 | 12 refills | Status: DC

## 2018-02-24 NOTE — Telephone Encounter (Signed)
Patient is requesting a refill on the following   SAFETY-LOK 3CC SYR 25GX5/8" 25G X 5/8" 3 ML MISC  She uses these for her B12 shots.  She would like this sent to [facility] on [address].

## 2018-03-29 ENCOUNTER — Other Ambulatory Visit: Payer: Self-pay | Admitting: Family Medicine

## 2018-03-29 DIAGNOSIS — E538 Deficiency of other specified B group vitamins: Secondary | ICD-10-CM

## 2018-05-16 ENCOUNTER — Telehealth: Payer: Self-pay | Admitting: Family Medicine

## 2018-05-16 NOTE — Telephone Encounter (Signed)
Patient needs level checked first.  Has not been checked since 2017

## 2018-05-16 NOTE — Telephone Encounter (Signed)
Patient needs refill of her Vitamin D sent to [facility] on [address].

## 2018-05-17 NOTE — Telephone Encounter (Signed)
Pt returned call saying she has a PE in Dec and does not see the need to have her labs done...  Thanks [employer]

## 2018-05-20 ENCOUNTER — Other Ambulatory Visit: Payer: Self-pay | Admitting: Family Medicine

## 2018-05-20 NOTE — Telephone Encounter (Signed)
Pt calling back on Vitamin D refill.  Pt states she had a CPE and her blood work to continue on all her prescribed medications.  She isn't understanding why she cannot get her Vitamin D refilled.  Pt wanting a call asap to discuss.  Thanks, [employer]

## 2018-05-23 ENCOUNTER — Other Ambulatory Visit: Payer: Self-pay

## 2018-05-23 NOTE — Telephone Encounter (Signed)
Advised  ED 

## 2018-05-23 NOTE — Telephone Encounter (Signed)
Have sent in enough o last her until she is seen

## 2018-06-14 ENCOUNTER — Other Ambulatory Visit: Payer: Self-pay | Admitting: Family Medicine

## 2018-06-14 DIAGNOSIS — R3129 Other microscopic hematuria: Secondary | ICD-10-CM

## 2018-06-16 ENCOUNTER — Other Ambulatory Visit: Payer: Commercial Managed Care - PPO

## 2018-06-16 DIAGNOSIS — R3129 Other microscopic hematuria: Secondary | ICD-10-CM

## 2018-06-16 LAB — URINALYSIS, COMPLETE
BILIRUBIN UA: NEGATIVE
Glucose, UA: NEGATIVE
KETONES UA: NEGATIVE
LEUKOCYTES UA: NEGATIVE
Nitrite, UA: NEGATIVE
PROTEIN UA: NEGATIVE
Specific Gravity, UA: 1.015 (ref 1.005–1.030)
UUROB: 0.2 mg/dL (ref 0.2–1.0)
pH, UA: 7 (ref 5.0–7.5)

## 2018-06-16 LAB — MICROSCOPIC EXAMINATION: WBC, UA: NONE SEEN /hpf (ref 0–5)

## 2018-06-17 ENCOUNTER — Telehealth: Payer: Self-pay

## 2018-06-17 NOTE — Telephone Encounter (Signed)
Pt is scheduled to see PCP in December. Will make appointment with [place] to discuss hematuria.

## 2018-06-17 NOTE — Telephone Encounter (Signed)
-----   Message from Nori Riis, PA-C sent at 06/17/2018  7:37 AM EST ----- Tracey Wolf has microscopic blood in her urine.  She also was found to have a pulmonary nodule on her CT last year for which she was to discuss with her PCP to see if follow up imaging was warranted.  She needs to make an appointment to discuss these issues.

## 2018-06-20 ENCOUNTER — Ambulatory Visit: Payer: Commercial Managed Care - PPO | Admitting: Physician Assistant

## 2018-06-20 ENCOUNTER — Encounter: Payer: Self-pay | Admitting: Physician Assistant

## 2018-06-20 VITALS — BP 110/70 | HR 73 | Temp 97.8°F | Resp 16 | Ht 67.0 in | Wt 134.0 lb

## 2018-06-20 DIAGNOSIS — M546 Pain in thoracic spine: Secondary | ICD-10-CM | POA: Diagnosis not present

## 2018-06-20 NOTE — Patient Instructions (Signed)

## 2018-06-20 NOTE — Progress Notes (Signed)
Acute Office Visit  Subjective:    Patient ID: Tracey Wolf, female    DOB: May 04, 1951, 67 y.o.   MRN: [id number]  Chief Complaint  Patient presents with  . Chest Pain    HPI Patient is in today for left side pain x's 6 weeks, reports no changes. Patiet reports a 100 pound dog ran into her twice around this time and caused some bruising. Patient denies any cough, or shortness of breath. Denies fevers, chills. She does have some right sided pain in this area. Has never broken ribs before. Concerned about any effect on her kidneys or spleen.   Past Medical History:  Diagnosis Date  . B12 deficiency   . Hyperlipidemia     Past Surgical History:  Procedure Laterality Date  . ABDOMINAL HYSTERECTOMY    . CERVICAL CONE BIOPSY    . COLONOSCOPY    . DILATION AND CURETTAGE OF UTERUS    . TUBAL LIGATION      Family History  Problem Relation Age of Onset  . Diabetes Mother   . Heart attack Mother   . COPD Father   . Pulmonary embolism Father   . Hyperlipidemia Sister   . Hyperlipidemia Brother   . Kidney disease Brother   . Kidney Stones Brother   . Hyperlipidemia Daughter   . Kidney cancer Neg Hx   . Prostate cancer Neg Hx   . Colon cancer Neg Hx   . Colon polyps Neg Hx   . Esophageal cancer Neg Hx   . Rectal cancer Neg Hx   . Stomach cancer Neg Hx     Social History   Socioeconomic History  . Marital status: Married    Spouse name: Not on file  . Number of children: Not on file  . Years of education: Not on file  . Highest education level: Not on file  Occupational History  . Not on file  Social Needs  . Financial resource strain: Not on file  . Food insecurity:    Worry: Not on file    Inability: Not on file  . Transportation needs:    Medical: Not on file    Non-medical: Not on file  Tobacco Use  . Smoking status: Former Smoker    Packs/day: 0.50    Years: 20.00    Pack years: 10.00    Types: Cigarettes    Last attempt to quit: 08/03/1990   Years since quitting: 27.9  . Smokeless tobacco: Never Used  Substance and Sexual Activity  . Alcohol use: Yes    Alcohol/week: 0.0 standard drinks    Comment: 2 per month  . Drug use: No  . Sexual activity: Yes    Birth control/protection: Surgical  Lifestyle  . Physical activity:    Days per week: Not on file    Minutes per session: Not on file  . Stress: Not on file  Relationships  . Social connections:    Talks on phone: Not on file    Gets together: Not on file    Attends religious service: Not on file    Active member of club or organization: Not on file    Attends meetings of clubs or organizations: Not on file    Relationship status: Not on file  . Intimate partner violence:    Fear of current or ex partner: Not on file    Emotionally abused: Not on file    Physically abused: Not on file    Forced sexual activity:  Not on file  Other Topics Concern  . Not on file  Social History Narrative  . Not on file    Outpatient Medications Prior to Visit  Medication Sig Dispense Refill  . cyanocobalamin (,VITAMIN B-12,) 1000 MCG/ML injection TAKE AS DIRECTED 10 mL 5  . EPINEPHrine (EPIPEN 2-PAK) 0.3 mg/0.3 mL IJ SOAJ injection EPIPEN 2-PAK, 0.3MG /0.3ML (Injection Solution Auto-injector)  1 (one) Soln Auto-inj Soln Auto-inj Inject as needed for 0 days  Quantity: 1;  Refills: 12   Ordered :19-Jul-2014  Miguel Aschoff MD Miguel Aschoff MD;  Started [phone] Active Comments: Medication taken as needed. 1 Device 5  . estradiol (VIVELLE-DOT) 0.05 MG/24HR patch Place 1 patch (0.05 mg total) onto the skin 2 (two) times a week. 24 patch 3  . ibuprofen (ADVIL,MOTRIN) 200 MG tablet Take 200 mg by mouth every 6 (six) hours as needed.    . SYRINGE-NEEDLE, DISP, 3 ML (SAFETY-LOK 3CC SYR 25GX5/8") 25G X 5/8" 3 ML MISC 1 each by Does not apply route See admin instructions. Use as directed 50 each 12  . Vitamin D, Ergocalciferol, (DRISDOL) 50000 units CAPS capsule TAKE 1 CAPSULE BY  MOUTH EVERY WEEK 4 capsule 2   Facility-Administered Medications Prior to Visit  Medication Dose Route Frequency Provider Last Rate Last Dose  . 0.9 %  sodium chloride infusion  500 mL Intravenous Once Irene Shipper, MD      . ciprofloxacin (CIPRO) tablet 500 mg  500 mg Oral Once Nickie Retort, MD      . lidocaine (XYLOCAINE) 2 % jelly 1 application  1 application Urethral Once Nickie Retort, MD        Allergies  Allergen Reactions  . Bee Venom   . Cefaclor   . Eggs Or Egg-Derived Products Nausea And Vomiting  . Iodinated Diagnostic Agents   . Iohexol      Desc: had severe reaction in october '05   swelling, sob and throat closing   . Doxycycline Rash  . Latex Rash    Review of Systems  Constitutional: Negative.   HENT: Negative.   Respiratory: Negative.   Cardiovascular: Positive for chest pain.       Objective:    Physical Exam  Constitutional: She is oriented to person, place, and time. She appears well-developed and well-nourished.  Cardiovascular: Normal rate and regular rhythm.  Pulmonary/Chest: Effort normal and breath sounds normal. She exhibits tenderness.  She is point tender around lateral aspect of left 10th rib.   Neurological: She is alert and oriented to person, place, and time.  Skin: Skin is warm and dry.  Psychiatric: She has a normal mood and affect. Her behavior is normal.    BP 110/70 (BP Location: Left Arm, Patient Position: Sitting, Cuff Size: Normal)   Pulse 73   Temp 97.8 F (36.6 C) (Oral)   Resp 16   Ht 5\' 7"  (1.702 m)   Wt 134 lb (60.8 kg)   SpO2 98%   BMI 20.99 kg/m  Wt Readings from Last 3 Encounters:  06/20/18 134 lb (60.8 kg)  01/10/18 133 lb (60.3 kg)  01/04/18 130 lb 14.4 oz (59.4 kg)    Health Maintenance Due  Topic Date Due  . MAMMOGRAM  05/26/2018  . PNA vac Low Risk Adult (2 of 2 - PPSV23) 06/09/2018    There are no preventive care reminders to display for this patient.   Lab Results  Component Value  Date   TSH 2.23 06/09/2017   Lab  Results  Component Value Date   WBC 5.3 06/09/2017   HGB 11.7 06/09/2017   HCT 34.9 (L) 06/09/2017   MCV 88.4 06/09/2017   PLT 239 06/09/2017   Lab Results  Component Value Date   NA 139 06/09/2017   K 4.1 06/09/2017   CO2 28 06/09/2017   GLUCOSE 88 06/09/2017   BUN 16 06/09/2017   CREATININE 0.81 06/09/2017   BILITOT 0.6 06/09/2017   ALKPHOS 164 (H) 06/22/2016   AST 13 06/09/2017   ALT 8 06/09/2017   PROT 6.9 06/09/2017   ALBUMIN 3.9 06/22/2016   CALCIUM 9.2 06/09/2017   Lab Results  Component Value Date   CHOL 266 (H) 06/09/2017   Lab Results  Component Value Date   HDL 55 06/09/2017   Lab Results  Component Value Date   LDLCALC 169 (H) 06/09/2017   Lab Results  Component Value Date   TRIG 256 (H) 06/09/2017   Lab Results  Component Value Date   CHOLHDL 4.8 06/09/2017   No results found for: HGBA1C     Assessment & Plan:  1. Acute left-sided thoracic back pain  Assured this is likely MSK pain from injury. Very low suspicion of splenic or kidney injury. Offered rib xray, patient prefers to observe. Can apply ice PRN and take NSAIDs.  Return if symptoms worsen or fail to improve.  The entirety of the information documented in the History of Present Illness, Review of Systems and Physical Exam were personally obtained by me. Portions of this information were initially documented by Lynford Humphrey, CMA and reviewed by me for thoroughness and accuracy.   No orders of the defined types were placed in this encounter.    Trinna Post, PA-C

## 2018-07-13 ENCOUNTER — Encounter: Payer: Self-pay | Admitting: Physician Assistant

## 2018-07-13 ENCOUNTER — Ambulatory Visit: Payer: Commercial Managed Care - PPO | Admitting: Physician Assistant

## 2018-07-13 VITALS — BP 137/81 | HR 97 | Temp 99.0°F | Wt 133.8 lb

## 2018-07-13 DIAGNOSIS — R059 Cough, unspecified: Secondary | ICD-10-CM

## 2018-07-13 DIAGNOSIS — R05 Cough: Secondary | ICD-10-CM | POA: Diagnosis not present

## 2018-07-13 DIAGNOSIS — J40 Bronchitis, not specified as acute or chronic: Secondary | ICD-10-CM

## 2018-07-13 MED ORDER — PROMETHAZINE-DM 6.25-15 MG/5ML PO SYRP: 5.0000 mL | ORAL_SOLUTION | Freq: Every evening | ORAL | 0 refills | Status: DC | PRN

## 2018-07-13 MED ORDER — ALBUTEROL SULFATE HFA 108 (90 BASE) MCG/ACT IN AERS: 2.0000 | INHALATION_SPRAY | Freq: Four times a day (QID) | RESPIRATORY_TRACT | 2 refills | Status: DC | PRN

## 2018-07-13 NOTE — Progress Notes (Signed)
[name] Female    DOB: 03-21-1951   66 y.o.   MRN: [id number] Visit Date: 07/13/2018  Today's Provider: Trinna Post, PA-C   Chief Complaint  Patient presents with  . URI   Subjective:    Upper Respiratory Infection Patient presents today for URI symptoms for about 2 week. Patient is having the following symptoms: cough, sinus pressure, SOB, light-headedness, ear pain, appetite change and fatigue. Patient has been taking Mucinex DM and ibuprofen.     Allergies  Allergen Reactions  . Bee Venom   . Cefaclor   . Eggs Or Egg-Derived Products Nausea And Vomiting  . Iodinated Diagnostic Agents   . Iohexol      Desc: had severe reaction in october '05   swelling, sob and throat closing   . Doxycycline Rash  . Latex Rash     Current Outpatient Medications:  .  cyanocobalamin (,VITAMIN B-12,) 1000 MCG/ML injection, TAKE AS DIRECTED, Disp: 10 mL, Rfl: 5 .  EPINEPHrine (EPIPEN 2-PAK) 0.3 mg/0.3 mL IJ SOAJ injection, EPIPEN 2-PAK, 0.3MG /0.3ML (Injection Solution Auto-injector)  1 (one) Soln Auto-inj Soln Auto-inj Inject as needed for 0 days  Quantity: 1;  Refills: 12   Ordered :19-Jul-2014  Miguel Aschoff MD Miguel Aschoff MD;  Started [phone] Active Comments: Medication taken as needed., Disp: 1 Device, Rfl: 5 .  estradiol (VIVELLE-DOT) 0.05 MG/24HR patch, Place 1 patch (0.05 mg total) onto the skin 2 (two) times a week., Disp: 24 patch, Rfl: 3 .  ibuprofen (ADVIL,MOTRIN) 200 MG tablet, Take 200 mg by mouth every 6 (six) hours as needed., Disp: , Rfl:  .  SYRINGE-NEEDLE, DISP, 3 ML (SAFETY-LOK 3CC SYR 25GX5/8") 25G X 5/8" 3 ML MISC, 1 each by Does not apply route See admin instructions. Use as directed, Disp: 50 each, Rfl: 12 .  Vitamin D, Ergocalciferol, (DRISDOL) 50000 units CAPS capsule, TAKE 1 CAPSULE BY MOUTH EVERY WEEK, Disp: 4 capsule, Rfl: 2  Current Facility-Administered Medications:  .  0.9 %  sodium chloride infusion, 500 mL,  Intravenous, Once, Irene Shipper, MD .  ciprofloxacin (CIPRO) tablet 500 mg, 500 mg, Oral, Once, Pilar Jarvis Horald Pollen, MD .  lidocaine (XYLOCAINE) 2 % jelly 1 application, 1 application, Urethral, Once, Pilar Jarvis Horald Pollen, MD  Review of Systems  Constitutional: Positive for appetite change and fatigue.  HENT: Positive for ear pain.   Respiratory: Positive for cough and shortness of breath.   Neurological: Positive for light-headedness.    Social History   Tobacco Use  . Smoking status: Former Smoker    Packs/day: 0.50    Years: 20.00    Pack years: 10.00    Types: Cigarettes    Last attempt to quit: 08/03/1990    Years since quitting: 27.9  . Smokeless tobacco: Never Used  Substance Use Topics  . Alcohol use: Yes    Alcohol/week: 0.0 standard drinks    Comment: 2 per month   Objective:   BP 137/81 (BP Location: Left Arm, Patient Position: Sitting, Cuff Size: Normal)   Pulse 97   Temp 99 F (37.2 C) (Oral)   Wt 133 lb 12.8 oz (60.7 kg)   SpO2 99%   BMI 20.96 kg/m  Vitals:   07/13/18 1646  BP: 137/81  Pulse: 97  Temp: 99 F (37.2 C)  TempSrc: Oral  SpO2: 99%  Weight: 133 lb 12.8 oz (60.7 kg)     Physical Exam Constitutional:      Appearance:  Normal appearance.  HENT:     Right Ear: Tympanic membrane and ear canal normal.     Left Ear: Tympanic membrane and ear canal normal.     Nose: Nose normal.     Mouth/Throat:     Mouth: Mucous membranes are moist.     Pharynx: Oropharynx is clear.  Neck:     Musculoskeletal: Normal range of motion and neck supple.  Cardiovascular:     Rate and Rhythm: Normal rate and regular rhythm.  Pulmonary:     Effort: Pulmonary effort is normal.     Breath sounds: Wheezing present. No rhonchi or rales.  Neurological:     Mental Status: She is alert and oriented to person, place, and time. Mental status is at baseline.  Psychiatric:        Mood and Affect: Mood normal.        Behavior: Behavior normal.         Assessment  & Plan:     1. Bronchitis  - albuterol (PROVENTIL HFA;VENTOLIN HFA) 108 (90 Base) MCG/ACT inhaler; Inhale 2 puffs into the lungs every 6 (six) hours as needed for wheezing or shortness of breath.  Dispense: 1 Inhaler; Refill: 2  2. Cough  - promethazine-dextromethorphan (PROMETHAZINE-DM) 6.25-15 MG/5ML syrup; Take 5 mLs by mouth at bedtime as needed for cough.  Dispense: 118 mL; Refill: 0  Return if symptoms worsen or fail to improve.  The entirety of the information documented in the History of Present Illness, Review of Systems and Physical Exam were personally obtained by me. Portions of this information were initially documented by Lyndel Pleasure, CMA and reviewed by me for thoroughness and accuracy.           Trinna Post, PA-C  [facility]

## 2018-07-13 NOTE — Patient Instructions (Signed)

## 2018-07-18 ENCOUNTER — Ambulatory Visit (INDEPENDENT_AMBULATORY_CARE_PROVIDER_SITE_OTHER): Payer: Commercial Managed Care - PPO | Admitting: Family Medicine

## 2018-07-18 ENCOUNTER — Encounter: Payer: Self-pay | Admitting: Family Medicine

## 2018-07-18 VITALS — BP 118/60 | HR 78 | Temp 98.3°F | Resp 16 | Ht 67.0 in | Wt 128.0 lb

## 2018-07-18 DIAGNOSIS — Z0000 Encounter for general adult medical examination without abnormal findings: Secondary | ICD-10-CM | POA: Diagnosis not present

## 2018-07-18 DIAGNOSIS — R319 Hematuria, unspecified: Secondary | ICD-10-CM

## 2018-07-18 DIAGNOSIS — E538 Deficiency of other specified B group vitamins: Secondary | ICD-10-CM

## 2018-07-18 DIAGNOSIS — E7849 Other hyperlipidemia: Secondary | ICD-10-CM

## 2018-07-18 NOTE — Progress Notes (Signed)
[name], Female    DOB: 08-12-1950, 67 y.o.   MRN: [id number] Visit Date: 07/18/2018  Today's Provider: Wilhemena Durie, MD   Chief Complaint  Patient presents with  . Annual Exam   Subjective:    Annual physical exam Tracey Wolf is a 67 y.o. female who presents today for health maintenance and complete physical. She feels fairly well. She reports exercising not regularly, but she does stay active. She reports she is sleeping well. Overall she feels very well.  She is doing a B12 shot every 4 days and that is working out well.  Colonoscopy- 08/25/2017. Diverticulosis. Internal hemorrhoids. Repeat in 10 years.  Mammogram- 05/26/2016. Normal. Repeat 1 year.  BMD- 05/26/2016. Osteopenia. Repeat in 2 years.  Pap smear- 05/26/2016. Normal.    Review of Systems  Constitutional: Positive for activity change.  HENT: Positive for congestion, ear pain, postnasal drip, sinus pressure, sinus pain, sneezing, sore throat and voice change.   Eyes: Negative.   Respiratory: Positive for cough.   Cardiovascular: Negative.   Gastrointestinal: Negative.   Endocrine: Negative.   Genitourinary: Negative.   Musculoskeletal: Negative.   Skin: Negative.   Allergic/Immunologic: Negative.   Neurological: Negative.   Hematological: Negative.   Psychiatric/Behavioral: Negative.     Social History      She  reports that she quit smoking about 27 years ago. Her smoking use included cigarettes. She has a 10.00 pack-year smoking history. She has never used smokeless tobacco. She reports current alcohol use. She reports that she does not use drugs.       Social History   Socioeconomic History  . Marital status: Married    Spouse name: Not on file  . Number of children: Not on file  . Years of education: Not on file  . Highest education level: Not on file  Occupational History  . Not on file  Social Needs  . Financial resource strain: Not on file  . Food  insecurity:    Worry: Not on file    Inability: Not on file  . Transportation needs:    Medical: Not on file    Non-medical: Not on file  Tobacco Use  . Smoking status: Former Smoker    Packs/day: 0.50    Years: 20.00    Pack years: 10.00    Types: Cigarettes    Last attempt to quit: 08/03/1990    Years since quitting: 27.9  . Smokeless tobacco: Never Used  Substance and Sexual Activity  . Alcohol use: Yes    Alcohol/week: 0.0 standard drinks    Comment: 2 per month  . Drug use: No  . Sexual activity: Yes    Birth control/protection: Surgical  Lifestyle  . Physical activity:    Days per week: Not on file    Minutes per session: Not on file  . Stress: Not on file  Relationships  . Social connections:    Talks on phone: Not on file    Gets together: Not on file    Attends religious service: Not on file    Active member of club or organization: Not on file    Attends meetings of clubs or organizations: Not on file    Relationship status: Not on file  Other Topics Concern  . Not on file  Social History Narrative  . Not on file    Past Medical History:  Diagnosis Date  . B12 deficiency   . Hyperlipidemia  Patient Active Problem List   Diagnosis Date Noted  . Cough 08/21/2016  . Endometriosis 04/25/2015  . Personal history of reproductive and obstetrical problems 04/25/2015  . HLD (hyperlipidemia) 04/25/2015  . B12 deficiency 04/25/2015    Past Surgical History:  Procedure Laterality Date  . ABDOMINAL HYSTERECTOMY    . CERVICAL CONE BIOPSY    . COLONOSCOPY    . DILATION AND CURETTAGE OF UTERUS    . TUBAL LIGATION      Family History        Family Status  Relation Name Status  . Mother  Deceased at age 9  . Father  Deceased at age 23  . Sister  Alive  . Brother  Alive  . Sister  Alive  . Daughter  Alive  . Neg Hx  (Not Specified)        Her family history includes COPD in her father; Diabetes in her mother; Heart attack in her mother;  Hyperlipidemia in her brother, daughter, and sister; Kidney Stones in her brother; Kidney disease in her brother; Pulmonary embolism in her father. There is no history of Kidney cancer, Prostate cancer, Colon cancer, Colon polyps, Esophageal cancer, Rectal cancer, or Stomach cancer.      Allergies  Allergen Reactions  . Bee Venom   . Cefaclor   . Eggs Or Egg-Derived Products Nausea And Vomiting  . Iodinated Diagnostic Agents   . Iohexol      Desc: had severe reaction in october '05   swelling, sob and throat closing   . Doxycycline Rash  . Latex Rash     Current Outpatient Medications:  .  albuterol (PROVENTIL HFA;VENTOLIN HFA) 108 (90 Base) MCG/ACT inhaler, Inhale 2 puffs into the lungs every 6 (six) hours as needed for wheezing or shortness of breath., Disp: 1 Inhaler, Rfl: 2 .  cyanocobalamin (,VITAMIN B-12,) 1000 MCG/ML injection, TAKE AS DIRECTED, Disp: 10 mL, Rfl: 5 .  EPINEPHrine (EPIPEN 2-PAK) 0.3 mg/0.3 mL IJ SOAJ injection, EPIPEN 2-PAK, 0.3MG /0.3ML (Injection Solution Auto-injector)  1 (one) Soln Auto-inj Soln Auto-inj Inject as needed for 0 days  Quantity: 1;  Refills: 12   Ordered :19-Jul-2014  Miguel Aschoff MD Miguel Aschoff MD;  Started [phone] Active Comments: Medication taken as needed., Disp: 1 Device, Rfl: 5 .  estradiol (VIVELLE-DOT) 0.05 MG/24HR patch, Place 1 patch (0.05 mg total) onto the skin 2 (two) times a week., Disp: 24 patch, Rfl: 3 .  ibuprofen (ADVIL,MOTRIN) 200 MG tablet, Take 200 mg by mouth every 6 (six) hours as needed., Disp: , Rfl:  .  promethazine-dextromethorphan (PROMETHAZINE-DM) 6.25-15 MG/5ML syrup, Take 5 mLs by mouth at bedtime as needed for cough., Disp: 118 mL, Rfl: 0 .  SYRINGE-NEEDLE, DISP, 3 ML (SAFETY-LOK 3CC SYR 25GX5/8") 25G X 5/8" 3 ML MISC, 1 each by Does not apply route See admin instructions. Use as directed, Disp: 50 each, Rfl: 12 .  Vitamin D, Ergocalciferol, (DRISDOL) 50000 units CAPS capsule, TAKE 1 CAPSULE BY MOUTH EVERY  WEEK, Disp: 4 capsule, Rfl: 2  Current Facility-Administered Medications:  .  0.9 %  sodium chloride infusion, 500 mL, Intravenous, Once, Irene Shipper, MD .  ciprofloxacin (CIPRO) tablet 500 mg, 500 mg, Oral, Once, Pilar Jarvis, Horald Pollen, MD .  lidocaine (XYLOCAINE) 2 % jelly 1 application, 1 application, Urethral, Once, Pilar Jarvis Horald Pollen, MD   Patient Care Team: Jerrol Banana., MD as PCP - General (Family Medicine)      Objective:   Vitals: BP 118/60 (BP  Location: Left Arm, Patient Position: Sitting, Cuff Size: Normal)   Pulse 78   Temp 98.3 F (36.8 C)   Resp 16   Ht 5\' 7"  (1.702 m)   Wt 128 lb (58.1 kg)   SpO2 97%   BMI 20.05 kg/m    Vitals:   07/18/18 1411  BP: 118/60  Pulse: 78  Resp: 16  Temp: 98.3 F (36.8 C)  SpO2: 97%  Weight: 128 lb (58.1 kg)  Height: 5\' 7"  (1.702 m)     Physical Exam Constitutional:      Appearance: She is well-developed.  HENT:     Head: Normocephalic and atraumatic.     Right Ear: External ear normal.     Left Ear: External ear normal.     Nose: Nose normal.  Eyes:     Conjunctiva/sclera: Conjunctivae normal.     Pupils: Pupils are equal, round, and reactive to light.  Neck:     Musculoskeletal: Normal range of motion and neck supple.  Cardiovascular:     Rate and Rhythm: Normal rate and regular rhythm.     Heart sounds: Normal heart sounds. No murmur. No gallop.   Pulmonary:     Effort: Pulmonary effort is normal. No respiratory distress.     Breath sounds: Normal breath sounds. No wheezing.  Abdominal:     General: There is no distension.     Palpations: Abdomen is soft.     Tenderness: There is no abdominal tenderness.  Musculoskeletal:        General: No tenderness.  Skin:    General: Skin is warm and dry.     Findings: No erythema or rash.  Neurological:     Mental Status: She is alert and oriented to person, place, and time.     Cranial Nerves: No cranial nerve deficit.  Psychiatric:        Behavior:  Behavior normal.        Thought Content: Thought content normal.        Judgment: Judgment normal.      Depression Screen PHQ 2/9 Scores 07/18/2018 06/09/2017 06/09/2017 01/25/2017  PHQ - 2 Score 0 0 0 0  PHQ- 9 Score - 0 - 0      Assessment & Plan:     Routine Health Maintenance and Physical Exam  Exercise Activities and Dietary recommendations Goals   None     Immunization History  Administered Date(s) Administered  . Pneumococcal Conjugate-13 06/09/2017  . Td 09/17/2004  . Tdap 06/19/2015  . Zoster 05/03/2012    Health Maintenance  Topic Date Due  . MAMMOGRAM  05/26/2018  . PNA vac Low Risk Adult (2 of 2 - PPSV23) 06/09/2018  . INFLUENZA VACCINE  04/21/2021 (Originally 03/03/2018)  . TETANUS/TDAP  06/18/2025  . COLONOSCOPY  08/26/2027  . DEXA SCAN  Completed  . Hepatitis C Screening  Completed     Discussed health benefits of physical activity, and encouraged her to engage in regular exercise appropriate for her age and condition.  1. Annual physical exam Overall patient enjoys very good health - CBC with Differential/Platelet - TSH  2. B12 deficiency B12 shot every 4 days. - Vitamin B12  3. Other hyperlipidemia  - Comprehensive metabolic panel - Lipid panel  4. Hematuria, unspecified type Undergoing work-up by urology.   I have done the exam and reviewed the above chart and it is accurate to the best of my knowledge. Development worker, community has been used in this note in any  air is in the dictation or transcription are unintentional.  Wilhemena Durie, MD  [facility]

## 2018-07-22 ENCOUNTER — Ambulatory Visit: Payer: Commercial Managed Care - PPO | Admitting: Urology

## 2018-07-22 ENCOUNTER — Encounter: Payer: Self-pay | Admitting: Urology

## 2018-07-22 VITALS — BP 132/70 | HR 77 | Ht 67.0 in | Wt 128.3 lb

## 2018-07-22 DIAGNOSIS — R3129 Other microscopic hematuria: Secondary | ICD-10-CM

## 2018-07-22 LAB — URINALYSIS, COMPLETE
Bilirubin, UA: NEGATIVE
Glucose, UA: NEGATIVE
Ketones, UA: NEGATIVE
LEUKOCYTES UA: NEGATIVE
Nitrite, UA: NEGATIVE
Protein, UA: NEGATIVE
Specific Gravity, UA: 1.015 (ref 1.005–1.030)
Urobilinogen, Ur: 0.2 mg/dL (ref 0.2–1.0)
pH, UA: 6 (ref 5.0–7.5)

## 2018-07-22 LAB — MICROSCOPIC EXAMINATION: WBC, UA: NONE SEEN /hpf (ref 0–5)

## 2018-07-22 NOTE — Progress Notes (Signed)
07/22/2018 11:31 AM   Tracey Wolf Tracey Wolf 1950/10/06 [id number]  Referring provider: Jerrol Wolf., MD [address] [place], [place] [address]  Chief Complaint  Patient presents with  . Hematuria    HPI: Patient is a 67 -year-old Caucasian female who returns today  today for the evaluation and management of microscopic hematuria.   Her PCP, Dr. Rosanna Wolf, plans on doing an annual blood test. The pulmonary nodule finding from the CT was discussed with PCP who has looked at the nodule before. She had pneumonia in the 90s where she met with a pulmonary specialist in [place] said it was scar tissue. The x-ray she had in 2017 didn't indicate any issues. The 3 mm nodule has not enlarged since.   Today, her UA shows 3-10 RBC and moderate bacteria. She reports of no UTI symptoms. Patient denies any gross hematuria, dysuria or suprapubic/flank pain.  Patient denies any fevers, chills, nausea or vomiting.    Background History:  She was referred to [place] by her PCP, Tracey Redwood, PA-C , for microscopic hematuria. Patient had a physical for insurance purposes and was told that she had blood in her urine.  She followed up with [name] and was found to have microscopic hematuria on 05/21/2017 with 10-20 RBC's/hpf.  Patient doesn't have a prior history of microscopic hematuria. She did not have a prior history of recurrent urinary tract infections, nephrolithiasis, trauma to the genitourinary tract or malignancies of the genitourinary tract.   Her brother has a history of large kidney stones.  She states that he almost bled out during the surgery to removed the stones because the surgeon nicked the kidney.  She also mentioned that her mother died after being injured with intubation and which resulted in blood in her lungs.  Her mother did have a tumor in vena cava.  She does not have a family medical history of malignancies of the genitourinary tract or hematuria.  On  05/31/2017, she was having symptoms of intermittent dysuria and occasional nocturia.  Her UA demonstrated 3-10 RBC's.  She was not experiencing any suprapubic pain, abdominal pain or flank pain.  She denies any recent fevers, chills, nausea or vomiting. She has not had any recent imaging studies. She is a former smoker.   She has not worked with Sports administrator, trichloroethylene, etc. She had a cystoscopy on 06/17/2017 which was negative for microscopic hematuria. Her CT scan from 06/14/2017 showed no CT findings to explain microscopic hematuria and 3 mm right lower lobe pulmonary nodule.   PMH: Past Medical History:  Diagnosis Date  . B12 deficiency   . Hyperlipidemia     Surgical History: Past Surgical History:  Procedure Laterality Date  . ABDOMINAL HYSTERECTOMY    . CERVICAL CONE BIOPSY    . COLONOSCOPY    . DILATION AND CURETTAGE OF UTERUS    . TUBAL LIGATION      Home Medications:  Allergies as of 07/22/2018      Reactions   Bee Venom    Cefaclor    Eggs Or Egg-derived Products Nausea And Vomiting   Iodinated Diagnostic Agents    Iohexol     Desc: had severe reaction in october '05   swelling, sob and throat closing   Doxycycline Rash   Latex Rash      Medication List       Accurate as of July 22, 2018 11:31 AM. Always use your most recent med list.  albuterol 108 (90 Base) MCG/ACT inhaler Commonly known as:  PROVENTIL HFA;VENTOLIN HFA Inhale 2 puffs into the lungs every 6 (six) hours as needed for wheezing or shortness of breath.   cyanocobalamin 1000 MCG/ML injection Commonly known as:  (VITAMIN B-12) TAKE AS DIRECTED   EPINEPHrine 0.3 mg/0.3 mL Soaj injection Commonly known as:  EPIPEN 2-PAK EPIPEN 2-PAK, 0.3MG/0.3ML (Injection Solution Auto-injector)  1 (one) Soln Auto-inj Soln Auto-inj Inject as needed for 0 days  Quantity: 1;  Refills: 12   Ordered :19-Jul-2014  Tracey Aschoff MD Tracey Aschoff MD;  Started [phone] Active  Comments: Medication taken as needed.   estradiol 0.05 MG/24HR patch Commonly known as:  VIVELLE-DOT Place 1 patch (0.05 mg total) onto the skin 2 (two) times a week.   ibuprofen 200 MG tablet Commonly known as:  ADVIL,MOTRIN Take 200 mg by mouth every 6 (six) hours as needed.   promethazine-dextromethorphan 6.25-15 MG/5ML syrup Commonly known as:  PROMETHAZINE-DM Take 5 mLs by mouth at bedtime as needed for cough.   SYRINGE-NEEDLE (DISP) 3 ML 25G X 5/8" 3 ML Misc Commonly known as:  SAFETY-LOK 3CC SYR 25GX5/8" 1 each by Does not apply route See admin instructions. Use as directed   Vitamin D (Ergocalciferol) 1.25 MG (50000 UT) Caps capsule Commonly known as:  DRISDOL TAKE 1 CAPSULE BY MOUTH EVERY WEEK       Allergies:  Allergies  Allergen Reactions  . Bee Venom   . Cefaclor   . Eggs Or Egg-Derived Products Nausea And Vomiting  . Iodinated Diagnostic Agents   . Iohexol      Desc: had severe reaction in october '05   swelling, sob and throat closing   . Doxycycline Rash  . Latex Rash    Family History: Family History  Problem Relation Age of Onset  . Diabetes Mother   . Heart attack Mother   . COPD Father   . Pulmonary embolism Father   . Hyperlipidemia Sister   . Hyperlipidemia Brother   . Kidney disease Brother   . Kidney Stones Brother   . Hyperlipidemia Daughter   . Kidney cancer Neg Hx   . Prostate cancer Neg Hx   . Colon cancer Neg Hx   . Colon polyps Neg Hx   . Esophageal cancer Neg Hx   . Rectal cancer Neg Hx   . Stomach cancer Neg Hx     Social History:  reports that she quit smoking about 27 years ago. Her smoking use included cigarettes. She has a 10.00 pack-year smoking history. She has never used smokeless tobacco. She reports current alcohol use. She reports that she does not use drugs.  ROS: UROLOGY Frequent Urination?: No Hard to postpone urination?: No Burning/pain with urination?: No Get up at night to urinate?: No Leakage of  urine?: No Urine stream starts and stops?: No Trouble starting stream?: No Do you have to strain to urinate?: No Blood in urine?: No Urinary tract infection?: No Sexually transmitted disease?: No Injury to kidneys or bladder?: No Painful intercourse?: No Weak stream?: No Currently pregnant?: No Vaginal bleeding?: No Last menstrual period?: n  Gastrointestinal Nausea?: No Vomiting?: No Indigestion/heartburn?: No Diarrhea?: No Constipation?: No  Constitutional Fever: No Night sweats?: No Weight loss?: No Fatigue?: No  Skin Skin rash/lesions?: No Itching?: No  Eyes Blurred vision?: No Double vision?: No  Ears/Nose/Throat Sore throat?: No Sinus problems?: No  Hematologic/Lymphatic Swollen glands?: No Easy bruising?: No  Cardiovascular Leg swelling?: No Chest pain?: No  Respiratory Cough?:  No Shortness of breath?: No  Endocrine Excessive thirst?: No  Musculoskeletal Back pain?: No Joint pain?: No  Neurological Headaches?: No Dizziness?: No  Psychologic Depression?: No Anxiety?: No  Physical Exam: BP 132/70 (BP Location: Left Arm, Patient Position: Sitting, Cuff Size: Normal)   Pulse 77   Ht _0  (1.702 m)   Wt 128 lb 4.8 oz (58.2 kg)   BMI 20.09 kg/m   Constitutional:  Well nourished. Alert and oriented, No acute distress. HEENT: [place] AT, moist mucus membranes.  Trachea midline, no masses. Cardiovascular: No clubbing, cyanosis, or edema. Respiratory: Normal respiratory effort, no increased work of breathing.  Skin: No rashes, bruises or suspicious lesions. Neurologic: Grossly intact, no focal deficits, moving all 4 extremities. Psychiatric: Normal mood and affect.   Urinalysis 3-10 RBC's and moderate bacteria. See EPIC. I have reviewed the labs.  Assessment & Plan:   1. Microscopic hematuria  -Cystoscopy from 06/17/2017 was negative and CT from 06/15/2018 was negative  -UA today shows 3-10 RBCs and moderate bacteria  -Referral to  Nephrology; f/u after nephrology visit    2. Pulmonary nodule   -CT showed small lung nodule  -Discussed pulmonary nodule with PCP; no concerns since it is scar tissue from her diagnosis with pneumonia from the 90s  Return for f/u pending nephrologist visit.  These notes generated with voice recognition software. I apologize for typographical errors.  Zara Council, PA-C  [place] I have reviewed the above documentation for accuracy and completeness, and I agree with the above.    [address] [place], [place] [address] [phone]  I, Lucas Mallow, am acting as a Education administrator for [employer],  I have reviewed the above documentation for accuracy and completeness, and I agree with the above.    Zara Council, PA-C

## 2018-08-05 ENCOUNTER — Ambulatory Visit (INDEPENDENT_AMBULATORY_CARE_PROVIDER_SITE_OTHER): Payer: Commercial Managed Care - PPO | Admitting: Family Medicine

## 2018-08-05 DIAGNOSIS — Z23 Encounter for immunization: Secondary | ICD-10-CM | POA: Diagnosis not present

## 2018-08-06 LAB — CBC WITH DIFFERENTIAL/PLATELET
Basophils Absolute: 0 10*3/uL (ref 0.0–0.2)
Basos: 1 %
EOS (ABSOLUTE): 0.1 10*3/uL (ref 0.0–0.4)
Eos: 2 %
Hematocrit: 36.1 % (ref 34.0–46.6)
Hemoglobin: 11.7 g/dL (ref 11.1–15.9)
IMMATURE GRANS (ABS): 0 10*3/uL (ref 0.0–0.1)
Immature Granulocytes: 0 %
LYMPHS ABS: 1.5 10*3/uL (ref 0.7–3.1)
Lymphs: 32 %
MCH: 28.7 pg (ref 26.6–33.0)
MCHC: 32.4 g/dL (ref 31.5–35.7)
MCV: 89 fL (ref 79–97)
Monocytes Absolute: 0.3 10*3/uL (ref 0.1–0.9)
Monocytes: 7 %
Neutrophils Absolute: 2.8 10*3/uL (ref 1.4–7.0)
Neutrophils: 58 %
Platelets: 198 10*3/uL (ref 150–450)
RBC: 4.07 x10E6/uL (ref 3.77–5.28)
RDW: 13.8 % (ref 12.3–15.4)
WBC: 4.8 10*3/uL (ref 3.4–10.8)

## 2018-08-06 LAB — LIPID PANEL
Chol/HDL Ratio: 5.3 ratio — ABNORMAL HIGH (ref 0.0–4.4)
Cholesterol, Total: 242 mg/dL — ABNORMAL HIGH (ref 100–199)
HDL: 46 mg/dL (ref >39)
LDL Calculated: 146 mg/dL — ABNORMAL HIGH (ref 0–99)
Triglycerides: 248 mg/dL — ABNORMAL HIGH (ref 0–149)
VLDL Cholesterol Cal: 50 mg/dL — ABNORMAL HIGH (ref 5–40)

## 2018-08-06 LAB — COMPREHENSIVE METABOLIC PANEL
ALK PHOS: 151 IU/L — AB (ref 39–117)
ALT: 9 IU/L (ref 0–32)
AST: 11 IU/L (ref 0–40)
Albumin/Globulin Ratio: 2.1 (ref 1.2–2.2)
Albumin: 4.2 g/dL (ref 3.6–4.8)
BUN/Creatinine Ratio: 17 (ref 12–28)
BUN: 15 mg/dL (ref 8–27)
Bilirubin Total: 0.3 mg/dL (ref 0.0–1.2)
CHLORIDE: 104 mmol/L (ref 96–106)
CO2: 22 mmol/L (ref 20–29)
CREATININE: 0.86 mg/dL (ref 0.57–1.00)
Calcium: 8.7 mg/dL (ref 8.7–10.3)
GFR calc Af Amer: 81 mL/min/{1.73_m2} (ref >59)
GFR calc non Af Amer: 70 mL/min/{1.73_m2} (ref >59)
Globulin, Total: 2 g/dL (ref 1.5–4.5)
Glucose: 93 mg/dL (ref 65–99)
Potassium: 4.8 mmol/L (ref 3.5–5.2)
Sodium: 141 mmol/L (ref 134–144)
Total Protein: 6.2 g/dL (ref 6.0–8.5)

## 2018-08-06 LAB — TSH: TSH: 2.83 u[IU]/mL (ref 0.450–4.500)

## 2018-08-06 LAB — VITAMIN B12: Vitamin B-12: >2000 pg/mL — ABNORMAL HIGH (ref 232–1245)

## 2018-08-08 NOTE — Progress Notes (Signed)
Here or nurse visit only to update immunization

## 2018-08-09 ENCOUNTER — Telehealth: Payer: Self-pay

## 2018-08-09 NOTE — Telephone Encounter (Signed)
Patient was advised.  

## 2018-08-09 NOTE — Telephone Encounter (Signed)
-----   Message from Jerrol Banana., MD sent at 08/08/2018  3:05 PM EST ----- Labs stable

## 2018-08-21 ENCOUNTER — Other Ambulatory Visit: Payer: Self-pay | Admitting: Family Medicine

## 2018-08-22 ENCOUNTER — Other Ambulatory Visit: Payer: Self-pay | Admitting: Obstetrics and Gynecology

## 2018-08-22 ENCOUNTER — Telehealth: Payer: Self-pay | Admitting: Urology

## 2018-08-22 DIAGNOSIS — N952 Postmenopausal atrophic vaginitis: Secondary | ICD-10-CM

## 2018-08-22 DIAGNOSIS — N941 Unspecified dyspareunia: Secondary | ICD-10-CM

## 2018-08-22 NOTE — Telephone Encounter (Signed)
She has an app on 09-15-18 @ 9:40   Sharyn Lull

## 2018-08-22 NOTE — Telephone Encounter (Signed)
Would you check to see if she saw nephrology?  And if so, would you get their notes?

## 2018-09-02 ENCOUNTER — Other Ambulatory Visit: Payer: Self-pay | Admitting: Family Medicine

## 2018-09-02 NOTE — Telephone Encounter (Signed)
[facility] faxed refill request for the following medications:  EPINEPHrine (EPIPEN 2-PAK) 0.3 mg/0.3 mL IJ SOAJ injection   Please advise.

## 2018-09-05 MED ORDER — EPINEPHRINE 0.3 MG/0.3ML IJ SOAJ: INTRAMUSCULAR | 5 refills | Status: DC

## 2018-09-05 NOTE — Telephone Encounter (Signed)
Please review. Thanks!  

## 2018-09-07 NOTE — Telephone Encounter (Signed)
Faxed Rx to [facility]. Thanks TNP

## 2018-09-20 ENCOUNTER — Other Ambulatory Visit: Payer: Self-pay | Admitting: Nephrology

## 2018-09-20 DIAGNOSIS — R809 Proteinuria, unspecified: Secondary | ICD-10-CM

## 2018-09-20 DIAGNOSIS — R319 Hematuria, unspecified: Secondary | ICD-10-CM

## 2018-09-27 ENCOUNTER — Ambulatory Visit
Admission: RE | Admit: 2018-09-27 | Discharge: 2018-09-27 | Disposition: A | Discharge status: Home / Self Care | Payer: Commercial Managed Care - PPO | Source: Ambulatory Visit | Attending: Nephrology | Admitting: Nephrology

## 2018-09-27 DIAGNOSIS — R319 Hematuria, unspecified: Secondary | ICD-10-CM

## 2018-09-27 DIAGNOSIS — R809 Proteinuria, unspecified: Secondary | ICD-10-CM | POA: Diagnosis present

## 2018-10-05 ENCOUNTER — Encounter: Payer: Commercial Managed Care - PPO | Admitting: Obstetrics and Gynecology

## 2018-10-26 ENCOUNTER — Encounter: Payer: Commercial Managed Care - PPO | Admitting: Obstetrics and Gynecology

## 2018-11-03 ENCOUNTER — Other Ambulatory Visit: Payer: Self-pay | Admitting: Family Medicine

## 2018-11-03 DIAGNOSIS — E538 Deficiency of other specified B group vitamins: Secondary | ICD-10-CM

## 2018-11-19 ENCOUNTER — Other Ambulatory Visit: Payer: Self-pay | Admitting: Obstetrics and Gynecology

## 2018-11-19 ENCOUNTER — Other Ambulatory Visit: Payer: Self-pay | Admitting: Family Medicine

## 2018-11-19 DIAGNOSIS — N952 Postmenopausal atrophic vaginitis: Secondary | ICD-10-CM

## 2018-11-19 DIAGNOSIS — N941 Unspecified dyspareunia: Secondary | ICD-10-CM

## 2019-01-09 ENCOUNTER — Other Ambulatory Visit: Payer: Self-pay | Admitting: Obstetrics and Gynecology

## 2019-01-09 DIAGNOSIS — Z1231 Encounter for screening mammogram for malignant neoplasm of breast: Secondary | ICD-10-CM

## 2019-01-16 ENCOUNTER — Telehealth: Payer: Self-pay

## 2019-01-16 NOTE — Telephone Encounter (Signed)
Pt prescreened no symptoms has face mask.   Coronavirus (COVID-19) Are you at risk?  Are you at risk for the Coronavirus (COVID-19)?  To be considered HIGH RISK for Coronavirus (COVID-19), you have to meet the following criteria:  . Traveled to [place], [place], [place], [place] or [place]; or in the [place] to [place], [place], [place], or [place]; and have fever, cough, and shortness of breath within the last 2 weeks of travel OR . Been in close contact with a person diagnosed with COVID-19 within the last 2 weeks and have fever, cough, and shortness of breath . IF YOU DO NOT MEET THESE CRITERIA, YOU ARE CONSIDERED LOW RISK FOR COVID-19.  What to do if you are HIGH RISK for COVID-19?  . If you are having a medical emergency, call 911. . Seek medical care right away. Before you go to a doctor's office, urgent care or emergency department, call ahead and tell them about your recent travel, contact with someone diagnosed with COVID-19, and your symptoms. You should receive instructions from your physician's office regarding next steps of care.  . When you arrive at healthcare provider, tell the healthcare staff immediately you have returned from visiting [place], [place], [place], [place] or [place]; or traveled in the [place] to [place], [place], [place], or [place]; in the last two weeks or you have been in close contact with a person diagnosed with COVID-19 in the last 2 weeks.   . Tell the health care staff about your symptoms: fever, cough and shortness of breath. . After you have been seen by a medical provider, you will be either: o Tested for (COVID-19) and discharged home on quarantine except to seek medical care if symptoms worsen, and asked to  - Stay home and avoid contact with others until you get your results (4-5 days)  - Avoid travel on public transportation if possible (such as bus, train, or airplane) or o Sent to the Emergency Department by EMS for  evaluation, COVID-19 testing, and possible admission depending on your condition and test results.  What to do if you are LOW RISK for COVID-19?  Reduce your risk of any infection by using the same precautions used for avoiding the common cold or flu:  . Wash your hands often with soap and warm water for at least 20 seconds.  If soap and water are not readily available, use an alcohol-based hand sanitizer with at least 60% alcohol.  . If coughing or sneezing, cover your mouth and nose by coughing or sneezing into the elbow areas of your shirt or coat, into a tissue or into your sleeve (not your hands). . Avoid shaking hands with others and consider head nods or verbal greetings only. . Avoid touching your eyes, nose, or mouth with unwashed hands.  . Avoid close contact with people who are sick. . Avoid places or events with large numbers of people in one location, like concerts or sporting events. . Carefully consider travel plans you have or are making. . If you are planning any travel outside or inside the [place], visit the CDC's Travelers' Health webpage for the latest health notices. . If you have some symptoms but not all symptoms, continue to monitor at home and seek medical attention if your symptoms worsen. . If you are having a medical emergency, call 911.   ADDITIONAL HEALTHCARE OPTIONS FOR PATIENTS  [facility] / e-Visit: [email or web address]           [facility]: [phone]  [facility]: [phone]                   [facility]: [phone]  

## 2019-01-17 ENCOUNTER — Encounter: Payer: Self-pay | Admitting: Obstetrics and Gynecology

## 2019-01-17 ENCOUNTER — Ambulatory Visit (INDEPENDENT_AMBULATORY_CARE_PROVIDER_SITE_OTHER): Payer: Commercial Managed Care - PPO | Admitting: Obstetrics and Gynecology

## 2019-01-17 ENCOUNTER — Ambulatory Visit: Payer: Commercial Managed Care - PPO | Admitting: Family Medicine

## 2019-01-17 ENCOUNTER — Other Ambulatory Visit: Payer: Self-pay

## 2019-01-17 ENCOUNTER — Encounter: Payer: Self-pay | Admitting: Family Medicine

## 2019-01-17 VITALS — BP 120/62 | HR 68 | Temp 97.6°F | Resp 16 | Wt 131.4 lb

## 2019-01-17 VITALS — BP 147/67 | HR 70 | Ht 67.0 in | Wt 131.2 lb

## 2019-01-17 DIAGNOSIS — N951 Menopausal and female climacteric states: Secondary | ICD-10-CM

## 2019-01-17 DIAGNOSIS — Z780 Asymptomatic menopausal state: Secondary | ICD-10-CM

## 2019-01-17 DIAGNOSIS — E538 Deficiency of other specified B group vitamins: Secondary | ICD-10-CM | POA: Diagnosis not present

## 2019-01-17 DIAGNOSIS — N941 Unspecified dyspareunia: Secondary | ICD-10-CM | POA: Diagnosis not present

## 2019-01-17 DIAGNOSIS — E782 Mixed hyperlipidemia: Secondary | ICD-10-CM

## 2019-01-17 DIAGNOSIS — N952 Postmenopausal atrophic vaginitis: Secondary | ICD-10-CM | POA: Diagnosis not present

## 2019-01-17 MED ORDER — ESTRADIOL 0.05 MG/24HR TD PTTW: 1.0000 | MEDICATED_PATCH | TRANSDERMAL | 3 refills | Status: DC

## 2019-01-17 NOTE — Progress Notes (Signed)
HPI:      [name] is a 68 y.o. G2P1011 who LMP was No LMP recorded. Patient has had a hysterectomy.  Subjective:   She presents today for her annual examination.  She remains on the [facility] and likes it.  She would like to continue.  She has no complaints today.    Hx: The following portions of the patient's history were reviewed and updated as appropriate:             She  has a past medical history of B12 deficiency and Hyperlipidemia. She does not have any pertinent problems on file. She  has a past surgical history that includes Abdominal hysterectomy; Tubal ligation; Dilation and curettage of uterus; Colonoscopy; and Cervical cone biopsy. Her family history includes COPD in her father; Diabetes in her mother; Heart attack in her mother; Hyperlipidemia in her brother, daughter, and sister; Kidney Stones in her brother; Kidney disease in her brother; Pulmonary embolism in her father. She  reports that she quit smoking about 28 years ago. Her smoking use included cigarettes. She has a 10.00 pack-year smoking history. She has never used smokeless tobacco. She reports current alcohol use. She reports that she does not use drugs. She has a current medication list which includes the following prescription(s): cyanocobalamin, epinephrine, estradiol, ibuprofen, syringe-needle (disp) 3 ml, and vitamin d (ergocalciferol), and the following Facility-Administered Medications: sodium chloride, ciprofloxacin, and lidocaine. She is allergic to bee venom; cefaclor; eggs or egg-derived products; iodinated diagnostic agents; iohexol; doxycycline; and latex.       Review of Systems:  Review of Systems  Constitutional: Denied constitutional symptoms, night sweats, recent illness, fatigue, fever, insomnia and weight loss.  Eyes: Denied eye symptoms, eye pain, photophobia, vision change and visual disturbance.  Ears/Nose/Throat/Neck: Denied ear, nose, throat or neck symptoms, hearing loss,  nasal discharge, sinus congestion and sore throat.  Cardiovascular: Denied cardiovascular symptoms, arrhythmia, chest pain/pressure, edema, exercise intolerance, orthopnea and palpitations.  Respiratory: Denied pulmonary symptoms, asthma, pleuritic pain, productive sputum, cough, dyspnea and wheezing.  Gastrointestinal: Denied, gastro-esophageal reflux, melena, nausea and vomiting.  Genitourinary: Denied genitourinary symptoms including symptomatic vaginal discharge, pelvic relaxation issues, and urinary complaints.  Musculoskeletal: Denied musculoskeletal symptoms, stiffness, swelling, muscle weakness and myalgia.  Dermatologic: Denied dermatology symptoms, rash and scar.  Neurologic: Denied neurology symptoms, dizziness, headache, neck pain and syncope.  Psychiatric: Denied psychiatric symptoms, anxiety and depression.  Endocrine: Denied endocrine symptoms including hot flashes and night sweats.   Meds:   Current Outpatient Medications on File Prior to Visit  Medication Sig Dispense Refill  . cyanocobalamin (,VITAMIN B-12,) 1000 MCG/ML injection TAKE AS DIRECTED 10 mL 5  . EPINEPHrine (EPIPEN 2-PAK) 0.3 mg/0.3 mL IJ SOAJ injection EPIPEN 2-PAK, 0.3MG /0.3ML (Injection Solution Auto-injector)  1 (one) Soln Auto-inj Soln Auto-inj Inject as needed for 0 days  Quantity: 1;  Refills: 12   Ordered :19-Jul-2014  Miguel Aschoff MD Miguel Aschoff MD;  Started [phone] Active Comments: Medication taken as needed. 1 Device 5  . ibuprofen (ADVIL,MOTRIN) 200 MG tablet Take 200 mg by mouth every 6 (six) hours as needed.    . SYRINGE-NEEDLE, DISP, 3 ML (SAFETY-LOK 3CC SYR 25GX5/8") 25G X 5/8" 3 ML MISC 1 each by Does not apply route See admin instructions. Use as directed 50 each 12  . Vitamin D, Ergocalciferol, (DRISDOL) 1.25 MG (50000 UT) CAPS capsule TAKE 1 CAPSULE BY MOUTH EVERY WEEK 4 capsule 2   Current Facility-Administered Medications on File Prior to Visit  Medication Dose Route  Frequency Provider Last Rate Last Dose  . 0.9 %  sodium chloride infusion  500 mL Intravenous Once Irene Shipper, MD      . ciprofloxacin (CIPRO) tablet 500 mg  500 mg Oral Once Nickie Retort, MD      . lidocaine (XYLOCAINE) 2 % jelly 1 application  1 application Urethral Once Nickie Retort, MD        Objective:     Vitals:   01/17/19 1105  BP: (!) 147/67  Pulse: 70              Physical examination General NAD, Conversant  HEENT Atraumatic; Op clear with mmm.  Normo-cephalic. Pupils reactive. Anicteric sclerae  Thyroid/Neck Smooth without nodularity or enlargement. Normal ROM.  Neck Supple.  Skin No rashes, lesions or ulceration. Normal palpated skin turgor. No nodularity.  Breasts: No masses or discharge.  Symmetric.  No axillary adenopathy.  Lungs: Clear to auscultation.No rales or wheezes. Normal Respiratory effort, no retractions.  Heart: NSR.  No murmurs or rubs appreciated. No periferal edema  Abdomen: Soft.  Non-tender.  No masses.  No HSM. No hernia  Extremities: Moves all appropriately.  Normal ROM for age. No lymphadenopathy.  Neuro: Oriented to PPT.  Normal mood. Normal affect.     Pelvic:   Vulva: Normal appearance.  No lesions.   Vagina: No lesions or abnormalities noted.  Support: Normal pelvic support.  Urethra No masses tenderness or scarring.  Meatus Normal size without lesions or prolapse.  Cervix: Surgically absent   Anus: Normal exam.  No lesions.  Perineum: Normal exam.  No lesions.        Bimanual   Uterus: Surgically absent   Adnexae:  Possibly some right adnexal fullness with associated discomfort.  Cul-de-sac: Negative for abnormality.     Assessment:    G2P1011 Patient Active Problem List   Diagnosis Date Noted  . Cough 08/21/2016  . Endometriosis 04/25/2015  . Personal history of reproductive and obstetrical problems 04/25/2015  . HLD (hyperlipidemia) 04/25/2015  . B12 deficiency 04/25/2015     1. Symptomatic menopausal or  female climacteric states   2. Vaginal atrophy   3. Dyspareunia in female     Patient doing well with Vivelle-Dot and would like to continue.  Patient states she has right adnexal tenderness secondary to previous endometriosis and possibly related to "collapsed sigmoid colon".  She says this has not changed in years.  It does not cause her any problems except upon examination.    Plan:            1.  Basic Screening Recommendations The basic screening recommendations for asymptomatic women were discussed with the patient during her visit.  The age-appropriate recommendations were discussed with her and the rational for the tests reviewed.  When I am informed by the patient that another primary care physician has previously obtained the age-appropriate tests and they are up-to-date, only outstanding tests are ordered and referrals given as necessary.  Abnormal results of tests will be discussed with her when all of her results are completed. Pap not yet due-mammogram ordered 2.  Continue Vivelle-Dot  Orders No orders of the defined types were placed in this encounter.    Meds ordered this encounter  Medications  . estradiol (VIVELLE-DOT) 0.05 MG/24HR patch    Sig: Place 1 patch (0.05 mg total) onto the skin 2 (two) times a week.    Dispense:  24 patch    Refill:  3        F/U  Return in about 1 year (around 01/17/2020) for Annual Physical.  Finis Bud, M.D. 01/17/2019 11:27 AM

## 2019-01-17 NOTE — Progress Notes (Signed)
Patient comes in today for annual exam. She is not due for anything. No concerns today.

## 2019-01-17 NOTE — Progress Notes (Signed)
[name] Female    DOB: 1950/10/09   67 y.o.   MRN: [id number] Visit Date: 01/17/2019  Today's Provider: Wilhemena Durie, MD   Chief Complaint  Patient presents with  . Follow-up    6 month follow up   Subjective:     HPI   Follow up for B12 deficiency Pt feeling well. The patient was last seen for this 6 months ago. Changes made at last visit include none.  She reports excellent compliance with treatment. Patient administers B12 injections every four days She feels that condition is Unchanged. She is not having side effects.   ------------------------------------------------------------------------------------   Allergies  Allergen Reactions  . Bee Venom   . Cefaclor   . Eggs Or Egg-Derived Products Nausea And Vomiting  . Iodinated Diagnostic Agents   . Iohexol      Desc: had severe reaction in october '05   swelling, sob and throat closing   . Doxycycline Rash  . Latex Rash     Current Outpatient Medications:  .  BD DISP NEEDLES 25G X 5/8" MISC, USE AS DIRECTED WITH B12 SHOTS, Disp: , Rfl:  .  cyanocobalamin (,VITAMIN B-12,) 1000 MCG/ML injection, TAKE AS DIRECTED, Disp: 10 mL, Rfl: 5 .  EPINEPHrine (EPIPEN 2-PAK) 0.3 mg/0.3 mL IJ SOAJ injection, EPIPEN 2-PAK, 0.3MG /0.3ML (Injection Solution Auto-injector)  1 (one) Soln Auto-inj Soln Auto-inj Inject as needed for 0 days  Quantity: 1;  Refills: 12   Ordered :19-Jul-2014  Miguel Aschoff MD Miguel Aschoff MD;  Started [phone] Active Comments: Medication taken as needed., Disp: 1 Device, Rfl: 5 .  [START ON 01/19/2019] estradiol (VIVELLE-DOT) 0.05 MG/24HR patch, Place 1 patch (0.05 mg total) onto the skin 2 (two) times a week., Disp: 24 patch, Rfl: 3 .  ibuprofen (ADVIL,MOTRIN) 200 MG tablet, Take 200 mg by mouth every 6 (six) hours as needed., Disp: , Rfl:  .  SYRINGE-NEEDLE, DISP, 3 ML (SAFETY-LOK 3CC SYR 25GX5/8") 25G X 5/8" 3 ML MISC, 1 each by Does not apply route See admin  instructions. Use as directed, Disp: 50 each, Rfl: 12 .  Vitamin D, Ergocalciferol, (DRISDOL) 1.25 MG (50000 UT) CAPS capsule, TAKE 1 CAPSULE BY MOUTH EVERY WEEK, Disp: 4 capsule, Rfl: 2  Current Facility-Administered Medications:  .  0.9 %  sodium chloride infusion, 500 mL, Intravenous, Once, Irene Shipper, MD .  ciprofloxacin (CIPRO) tablet 500 mg, 500 mg, Oral, Once, Pilar Jarvis Horald Pollen, MD .  lidocaine (XYLOCAINE) 2 % jelly 1 application, 1 application, Urethral, Once, Nickie Retort, MD  Review of Systems  Constitutional: Negative for activity change, appetite change, fatigue and unexpected weight change.  Eyes: Negative.   Respiratory: Negative.   Cardiovascular: Negative.   Gastrointestinal: Negative.   Endocrine: Positive for cold intolerance.  Musculoskeletal: Negative.   Neurological: Negative.   Psychiatric/Behavioral: Negative.     Social History   Tobacco Use  . Smoking status: Former Smoker    Packs/day: 0.50    Years: 20.00    Pack years: 10.00    Types: Cigarettes    Quit date: 08/03/1990    Years since quitting: 28.4  . Smokeless tobacco: Never Used  Substance Use Topics  . Alcohol use: Yes    Alcohol/week: 0.0 standard drinks    Comment: 2 per month      Objective:   BP 120/62   Pulse 68   Temp 97.6 F (36.4 C) (Oral)   Resp 16  Wt 131 lb 6.4 oz (59.6 kg)   SpO2 99%   BMI 20.58 kg/m  Vitals:   01/17/19 1549  BP: 120/62  Pulse: 68  Resp: 16  Temp: 97.6 F (36.4 C)  TempSrc: Oral  SpO2: 99%  Weight: 131 lb 6.4 oz (59.6 kg)     Physical Exam Vitals signs reviewed.  Constitutional:      Appearance: She is well-developed.  HENT:     Head: Normocephalic and atraumatic.     Right Ear: External ear normal.     Left Ear: External ear normal.     Nose: Nose normal.  Eyes:     General: No scleral icterus.    Conjunctiva/sclera: Conjunctivae normal.  Neck:     Thyroid: No thyromegaly.  Cardiovascular:     Rate and Rhythm: Normal  rate and regular rhythm.     Heart sounds: Normal heart sounds.  Pulmonary:     Effort: Pulmonary effort is normal.     Breath sounds: Normal breath sounds.  Abdominal:     Palpations: Abdomen is soft.  Skin:    General: Skin is warm and dry.  Neurological:     Mental Status: She is alert and oriented to person, place, and time.  Psychiatric:        Behavior: Behavior normal.        Thought Content: Thought content normal.        Judgment: Judgment normal.         Assessment & Plan    1. B12 deficiency Discussed slowly cutting back on frequency of B12 shots going forward.RTC 6 months. B12 refilled yearly 2. Mixed hyperlipidemia 3.Postmenopausal     Wilhemena Durie, MD  [facility] Fritzi Mandes [place] as a scribe for Wilhemena Durie, MD.,have documented all relevant documentation on the behalf of Wilhemena Durie, MD,as directed by  Wilhemena Durie, MD while in the presence of Wilhemena Durie, MD.

## 2019-01-21 DIAGNOSIS — Z780 Asymptomatic menopausal state: Secondary | ICD-10-CM | POA: Insufficient documentation

## 2019-02-06 ENCOUNTER — Other Ambulatory Visit: Payer: Self-pay | Admitting: Family Medicine

## 2019-02-23 ENCOUNTER — Other Ambulatory Visit: Payer: Self-pay

## 2019-02-23 ENCOUNTER — Ambulatory Visit
Admission: RE | Admit: 2019-02-23 | Discharge: 2019-02-23 | Disposition: A | Discharge status: Home / Self Care | Payer: Commercial Managed Care - PPO | Source: Ambulatory Visit | Attending: Obstetrics and Gynecology | Admitting: Obstetrics and Gynecology

## 2019-02-23 DIAGNOSIS — Z1231 Encounter for screening mammogram for malignant neoplasm of breast: Secondary | ICD-10-CM

## 2019-03-17 ENCOUNTER — Other Ambulatory Visit: Payer: Self-pay

## 2019-05-06 ENCOUNTER — Other Ambulatory Visit: Payer: Self-pay | Admitting: Family Medicine

## 2019-07-06 ENCOUNTER — Other Ambulatory Visit: Payer: Self-pay | Admitting: *Deleted

## 2019-07-06 MED ORDER — "SAFETY-LOK SYRINGE 25G X 5/8"" 3 ML MISC": 1.0000 | 12 refills | Status: DC

## 2019-07-19 NOTE — Progress Notes (Signed)
[name], Female    DOB: [birth year], 68 y.o.   MRN: [id number] Visit Date: 07/20/2019  Today's Provider: Wilhemena Durie, MD   Chief Complaint  Patient presents with  . Annual Exam   Subjective:     Annual wellness visit Tracey Wolf is a 68 y.o. female. She feels well. She reports exercising yes. She reports she is sleeping well.  -----------------------------------------------------------  Colonoscopy: 08/25/2017 Mammogram: 02/23/2019 PAP: 05/26/2016: Normal   Review of Systems  Eyes: Negative.   Cardiovascular: Negative.   Gastrointestinal: Negative.   Endocrine: Negative.   Genitourinary: Negative.   Musculoskeletal: Negative.   Skin: Negative.   Allergic/Immunologic: Positive for environmental allergies.  Neurological: Negative.   Hematological: Negative.   Psychiatric/Behavioral: Negative.     Social History   Socioeconomic History  . Marital status: Married    Spouse name: Not on file  . Number of children: Not on file  . Years of education: Not on file  . Highest education level: Not on file  Occupational History  . Not on file  Tobacco Use  . Smoking status: Former Smoker    Packs/day: 0.50    Years: 20.00    Pack years: 10.00    Types: Cigarettes    Quit date: 08/03/1990    Years since quitting: 28.9  . Smokeless tobacco: Never Used  Substance and Sexual Activity  . Alcohol use: Yes    Alcohol/week: 0.0 standard drinks    Comment: 2 per month  . Drug use: No  . Sexual activity: Yes    Birth control/protection: Surgical  Other Topics Concern  . Not on file  Social History Narrative  . Not on file   Social Determinants of Health   Financial Resource Strain:   . Difficulty of Paying Living Expenses: Not on file  Food Insecurity:   . Worried About Charity fundraiser in the Last Year: Not on file  . Ran Out of Food in the Last Year: Not on file  Transportation Needs:   . Lack of Transportation (Medical):  Not on file  . Lack of Transportation (Non-Medical): Not on file  Physical Activity:   . Days of Exercise per Week: Not on file  . Minutes of Exercise per Session: Not on file  Stress:   . Feeling of Stress : Not on file  Social Connections:   . Frequency of Communication with Friends and Family: Not on file  . Frequency of Social Gatherings with Friends and Family: Not on file  . Attends Religious Services: Not on file  . Active Member of Clubs or Organizations: Not on file  . Attends Archivist Meetings: Not on file  . Marital Status: Not on file  Intimate Partner Violence:   . Fear of Current or Ex-Partner: Not on file  . Emotionally Abused: Not on file  . Physically Abused: Not on file  . Sexually Abused: Not on file    Past Medical History:  Diagnosis Date  . B12 deficiency   . Hyperlipidemia      Patient Active Problem List   Diagnosis Date Noted  . Postmenopausal 01/21/2019  . Cough 08/21/2016  . Endometriosis 04/25/2015  . Personal history of reproductive and obstetrical problems 04/25/2015  . HLD (hyperlipidemia) 04/25/2015  . B12 deficiency 04/25/2015    Past Surgical History:  Procedure Laterality Date  . ABDOMINAL HYSTERECTOMY    . CERVICAL CONE BIOPSY    . COLONOSCOPY    .  DILATION AND CURETTAGE OF UTERUS    . TUBAL LIGATION      Her family history includes COPD in her father; Diabetes in her mother; Heart attack in her mother; Hyperlipidemia in her brother, daughter, and sister; Kidney Stones in her brother; Kidney disease in her brother; Pulmonary embolism in her father. There is no history of Kidney cancer, Prostate cancer, Colon cancer, Colon polyps, Esophageal cancer, Rectal cancer, or Stomach cancer.   Current Outpatient Medications:  .  BD DISP NEEDLES 25G X 5/8" MISC, USE AS DIRECTED WITH B12 SHOTS, Disp: , Rfl:  .  cyanocobalamin (,VITAMIN B-12,) 1000 MCG/ML injection, TAKE AS DIRECTED, Disp: 10 mL, Rfl: 5 .  EPINEPHrine (EPIPEN  2-PAK) 0.3 mg/0.3 mL IJ SOAJ injection, EPIPEN 2-PAK, 0.3MG /0.3ML (Injection Solution Auto-injector)  1 (one) Soln Auto-inj Soln Auto-inj Inject as needed for 0 days  Quantity: 1;  Refills: 12   Ordered :19-Jul-2014  Miguel Aschoff MD Miguel Aschoff MD;  Started [phone] Active Comments: Medication taken as needed., Disp: 1 Device, Rfl: 5 .  estradiol (VIVELLE-DOT) 0.05 MG/24HR patch, Place 1 patch (0.05 mg total) onto the skin 2 (two) times a week., Disp: 24 patch, Rfl: 3 .  ibuprofen (ADVIL,MOTRIN) 200 MG tablet, Take 200 mg by mouth every 6 (six) hours as needed., Disp: , Rfl:  .  SYRINGE-NEEDLE, DISP, 3 ML (SAFETY-LOK 3CC SYR 25GX5/8") 25G X 5/8" 3 ML MISC, 1 each by Does not apply route See admin instructions. Use as directed, Disp: 50 each, Rfl: 12 .  Vitamin D, Ergocalciferol, (DRISDOL) 1.25 MG (50000 UT) CAPS capsule, TAKE 1 CAPSULE BY MOUTH EVERY WEEK, Disp: 4 capsule, Rfl: 12  Current Facility-Administered Medications:  .  0.9 %  sodium chloride infusion, 500 mL, Intravenous, Once, Irene Shipper, MD .  ciprofloxacin (CIPRO) tablet 500 mg, 500 mg, Oral, Once, Nickie Retort, MD .  lidocaine (XYLOCAINE) 2 % jelly 1 application, 1 application, Urethral, Once, Nickie Retort, MD  Patient Care Team: Jerrol Banana., MD as PCP - General (Family Medicine)    Objective:    Vitals: BP 127/76 (BP Location: Right Arm, Patient Position: Sitting, Cuff Size: Large)   Pulse 66   Temp (!) 97.5 F (36.4 C) (Other (Comment))   Resp 16   Ht 5\' 7"  (1.702 m)   Wt 135 lb (61.2 kg)   SpO2 97%   BMI 21.14 kg/m   Physical Exam Vitals reviewed.  Constitutional:      Appearance: She is well-developed.  HENT:     Head: Normocephalic and atraumatic.     Right Ear: External ear normal.     Left Ear: External ear normal.     Nose: Nose normal.  Eyes:     Conjunctiva/sclera: Conjunctivae normal.     Pupils: Pupils are equal, round, and reactive to light.  Cardiovascular:      Rate and Rhythm: Normal rate and regular rhythm.     Heart sounds: Normal heart sounds. No murmur. No gallop.   Pulmonary:     Effort: Pulmonary effort is normal. No respiratory distress.     Breath sounds: Normal breath sounds. No wheezing.  Abdominal:     General: There is no distension.     Palpations: Abdomen is soft.     Tenderness: There is no abdominal tenderness.  Musculoskeletal:        General: No tenderness.     Cervical back: Normal range of motion and neck supple.     Right  lower leg: No edema.     Left lower leg: No edema.  Skin:    General: Skin is warm and dry.     Findings: No erythema or rash.  Neurological:     General: No focal deficit present.     Mental Status: She is alert and oriented to person, place, and time.     Cranial Nerves: No cranial nerve deficit.  Psychiatric:        Mood and Affect: Mood normal.        Behavior: Behavior normal.        Thought Content: Thought content normal.        Judgment: Judgment normal.     Activities of Daily Living No flowsheet data found.  Fall Risk Assessment Fall Risk  06/09/2017 01/25/2017 04/22/2015  Falls in the past year? No No No     Depression Screen PHQ 2/9 Scores 07/20/2019 07/18/2018 06/09/2017 06/09/2017  PHQ - 2 Score 0 0 0 0  PHQ- 9 Score 0 - 0 -    No flowsheet data found.    Assessment & Plan:     Annual Wellness Visit  Reviewed patient's Family Medical History Reviewed and updated list of patient's medical providers Assessment of cognitive impairment was done Assessed patient's functional ability Established a written schedule for health screening [place] Completed and Reviewed  Exercise Activities and Dietary recommendations Goals   None     Immunization History  Administered Date(s) Administered  . Pneumococcal Conjugate-13 06/09/2017  . Pneumococcal Polysaccharide-23 08/05/2018  . Td 09/17/2004  . Tdap 06/19/2015  . Zoster 05/03/2012    Health  Maintenance  Topic Date Due  . INFLUENZA VACCINE  04/21/2021 (Originally 03/04/2019)  . MAMMOGRAM  02/22/2021  . TETANUS/TDAP  06/18/2025  . COLONOSCOPY  08/26/2027  . DEXA SCAN  Completed  . Hepatitis C Screening  Completed  . PNA vac Low Risk Adult  Completed     Discussed health benefits of physical activity, and encouraged her to engage in regular exercise appropriate for her age and condition.    --------------------------------------------------------------------  1. Annual physical exam Well woman exam per Dr Amalia Hailey. - TSH - Comprehensive Metabolic Panel (CMET) - CBC w/Diff/Platelet - Lipid panel - Vitamin D (25 hydroxy) - Vitamin B12  2. Mixed hyperlipidemia  - TSH - Comprehensive Metabolic Panel (CMET) - CBC w/Diff/Platelet - Lipid panel - Vitamin D (25 hydroxy) - Vitamin B12  3. Vitamin B 12 deficiency  - TSH - Comprehensive Metabolic Panel (CMET) - CBC w/Diff/Platelet - Lipid panel - Vitamin D (25 hydroxy) - Vitamin B12  4. B12 deficiency  - TSH - Comprehensive Metabolic Panel (CMET) - CBC w/Diff/Platelet - Lipid panel - Vitamin D (25 hydroxy) - Vitamin B12  5. Vitamin D deficiency  - TSH - Comprehensive Metabolic Panel (CMET) - CBC w/Diff/Platelet - Lipid panel - Vitamin D (25 hydroxy) - Vitamin B12   Follow up in one year for CPE.   I,Zakia Sainato,acting as a scribe for Wilhemena Durie, MD.,have documented all relevant documentation on the behalf of Wilhemena Durie, MD,as directed by  Wilhemena Durie, MD while in the presence of Wilhemena Durie, MD.    Wilhemena Durie, MD  [facility]

## 2019-07-20 ENCOUNTER — Ambulatory Visit: Payer: Commercial Managed Care - PPO | Admitting: Urology

## 2019-07-20 ENCOUNTER — Encounter: Payer: Self-pay | Admitting: Family Medicine

## 2019-07-20 ENCOUNTER — Other Ambulatory Visit: Payer: Self-pay

## 2019-07-20 ENCOUNTER — Ambulatory Visit (INDEPENDENT_AMBULATORY_CARE_PROVIDER_SITE_OTHER): Payer: Commercial Managed Care - PPO | Admitting: Family Medicine

## 2019-07-20 VITALS — BP 127/76 | HR 66 | Temp 97.5°F | Resp 16 | Ht 67.0 in | Wt 135.0 lb

## 2019-07-20 DIAGNOSIS — E559 Vitamin D deficiency, unspecified: Secondary | ICD-10-CM | POA: Diagnosis not present

## 2019-07-20 DIAGNOSIS — E782 Mixed hyperlipidemia: Secondary | ICD-10-CM | POA: Diagnosis not present

## 2019-07-20 DIAGNOSIS — E538 Deficiency of other specified B group vitamins: Secondary | ICD-10-CM

## 2019-07-20 DIAGNOSIS — Z0000 Encounter for general adult medical examination without abnormal findings: Secondary | ICD-10-CM | POA: Diagnosis not present

## 2019-07-20 DIAGNOSIS — Z91012 Allergy to eggs: Secondary | ICD-10-CM

## 2019-08-02 NOTE — Progress Notes (Deleted)
07/22/2018 9:37 PM   Tracey Wolf [name] 1951/03/12 [id number]  Referring provider: Jerrol Banana., MD [address] [place],  [place] [address]  No chief complaint on file.   HPI: Patient is a 68 -year-old  female with a history of hematuria who presents today for follow up.  History of hematuria (high risk) Former smoker.  CTU 06/2017 No CT findings to explain the patient's history of lower abdominal pain and microscopic hematuria.  Aortic Atherosclerois (ICD10-170.0)  3 mm right lower lobe pulmonary nodule. Cystoscopy 06/2017 with Dr. Pilar Jarvis NED.  Has been evaluated by nephrology as well.  No reports of gross hematuria.  UA 08/03/2019 ***.   PMH: Past Medical History:  Diagnosis Date  . B12 deficiency   . Hyperlipidemia     Surgical History: Past Surgical History:  Procedure Laterality Date  . ABDOMINAL HYSTERECTOMY    . CERVICAL CONE BIOPSY    . COLONOSCOPY    . DILATION AND CURETTAGE OF UTERUS    . TUBAL LIGATION      Home Medications:  Allergies as of 08/03/2019      Reactions   Bee Venom    Cefaclor    Eggs Or Egg-derived Products Nausea And Vomiting   Iodinated Diagnostic Agents    Iohexol     Desc: had severe reaction in october '05   swelling, sob and throat closing   Doxycycline Rash   Latex Rash      Medication List       Accurate as of August 02, 2019  9:37 PM. If you have any questions, ask your nurse or doctor.        BD Disp Needles 25G X 5/8" Misc Generic drug: NEEDLE (DISP) 25 G USE AS DIRECTED WITH B12 SHOTS   cyanocobalamin 1000 MCG/ML injection Commonly known as: (VITAMIN B-12) TAKE AS DIRECTED   EPINEPHrine 0.3 mg/0.3 mL Soaj injection Commonly known as: EpiPen 2-Pak EPIPEN 2-PAK, 0.3MG /0.3ML (Injection Solution Auto-injector)  1 (one) Soln Auto-inj Soln Auto-inj Inject as needed for 0 days  Quantity: 1;  Refills: 12   Ordered :19-Jul-2014  Miguel Aschoff MD Miguel Aschoff MD;  Started  [phone] Active Comments: Medication taken as needed.   estradiol 0.05 MG/24HR patch Commonly known as: VIVELLE-DOT Place 1 patch (0.05 mg total) onto the skin 2 (two) times a week.   ibuprofen 200 MG tablet Commonly known as: ADVIL Take 200 mg by mouth every 6 (six) hours as needed.   SAFETY-LOK 3CC SYR 25GX5/8" 25G X 5/8" 3 ML Misc Generic drug: SYRINGE-NEEDLE (DISP) 3 ML 1 each by Does not apply route See admin instructions. Use as directed   Vitamin D (Ergocalciferol) 1.25 MG (50000 UT) Caps capsule Commonly known as: DRISDOL TAKE 1 CAPSULE BY MOUTH EVERY WEEK       Allergies:  Allergies  Allergen Reactions  . Bee Venom   . Cefaclor   . Eggs Or Egg-Derived Products Nausea And Vomiting  . Iodinated Diagnostic Agents   . Iohexol      Desc: had severe reaction in october '05   swelling, sob and throat closing   . Doxycycline Rash  . Latex Rash    Family History: Family History  Problem Relation Age of Onset  . Diabetes Mother   . Heart attack Mother   . COPD Father   . Pulmonary embolism Father   . Hyperlipidemia Sister   . Hyperlipidemia Brother   . Kidney disease Brother   . Kidney  Stones Brother   . Hyperlipidemia Daughter   . Kidney cancer Neg Hx   . Prostate cancer Neg Hx   . Colon cancer Neg Hx   . Colon polyps Neg Hx   . Esophageal cancer Neg Hx   . Rectal cancer Neg Hx   . Stomach cancer Neg Hx     Social History:  reports that she quit smoking about 29 years ago. Her smoking use included cigarettes. She has a 10.00 pack-year smoking history. She has never used smokeless tobacco. She reports current alcohol use. She reports that she does not use drugs.  ROS:                                        Physical Exam: There were no vitals taken for this visit.  Constitutional:  Well nourished. Alert and oriented, No acute distress. HEENT: [place] AT, moist mucus membranes.  Trachea midline, no masses. Cardiovascular: No  clubbing, cyanosis, or edema. Respiratory: Normal respiratory effort, no increased work of breathing. GI: Abdomen is soft, non tender, non distended, no abdominal masses. Liver and spleen not palpable.  No hernias appreciated.  Stool sample for occult testing is not indicated.   GU: No CVA tenderness.  No bladder fullness or masses.  *** external genitalia, *** pubic hair distribution, no lesions.  Normal urethral meatus, no lesions, no prolapse, no discharge.   No urethral masses, tenderness and/or tenderness. No bladder fullness, tenderness or masses. *** vagina mucosa, *** estrogen effect, no discharge, no lesions, *** pelvic support, *** cystocele and *** rectocele noted.  No cervical motion tenderness.  Uterus is freely mobile and non-fixed.  No adnexal/parametria masses or tenderness noted.  Anus and perineum are without rashes or lesions.   ***  Skin: No rashes, bruises or suspicious lesions. Lymph: No cervical or inguinal adenopathy. Neurologic: Grossly intact, no focal deficits, moving all 4 extremities. Psychiatric: Normal mood and affect.   Urinalysis *** I have reviewed the labs.  Assessment & Plan:   1. History of hematuria (high risk) Hematuria work up completed in 06/2017 - findings positive for NED No report of gross hematuria *** UA today *** RTC in one year for UA - patient to report any gross hematuria in the interim     No follow-ups on file.  These notes generated with voice recognition software. I apologize for typographical errors.  Zara Council, PA-C  [facility] [address] [place], [place], [place] [address] [phone]

## 2019-08-03 ENCOUNTER — Other Ambulatory Visit: Payer: Self-pay

## 2019-08-03 ENCOUNTER — Ambulatory Visit: Payer: Commercial Managed Care - PPO | Admitting: Urology

## 2019-08-10 ENCOUNTER — Other Ambulatory Visit: Payer: Self-pay | Admitting: Family Medicine

## 2019-08-10 DIAGNOSIS — E538 Deficiency of other specified B group vitamins: Secondary | ICD-10-CM

## 2019-08-10 NOTE — Telephone Encounter (Signed)
Requested medication (s) are due for refill today: yes  Requested medication (s) are on the active medication list: yes  Last refill:  07/13/2019  Future visit scheduled: yes  Notes to clinic: Medication not assigned to a protocol, review manually   Requested Prescriptions  Pending Prescriptions Disp Refills   cyanocobalamin (,VITAMIN B-12,) 1000 MCG/ML injection [Pharmacy Med Name: CYANOCOBALAMIN 1000MCG/ML INJ, 1ML] 10 mL 5    Sig: TAKE AS DIRECTED      Off-Protocol Failed - 08/10/2019  3:29 AM      Failed - Medication not assigned to a protocol, review manually.      Passed - Valid encounter within last 12 months    Recent Outpatient Visits           3 weeks ago Annual physical exam   [facility] Jerrol Banana., MD   6 months ago B12 deficiency   [facility] Jerrol Banana., MD   1 year ago Annual physical exam   [facility] Jerrol Banana., MD   1 year ago [place] Car[place] Collet M, [place]   1 year ago Acute left-sided thoracic back pain   [facility] Trinna Post, [place]       Future Appointments             In 5 days Ernestine Conrad Gordan Payment [place]   In 5 months Amalia Hailey Nyoka Lint, MD [facility]           Off-Protocol Failed - 08/10/2019  3:29 AM      Failed - Medication not assigned to a protocol, review manually.      Passed - Valid encounter within last 12 months    Recent Outpatient Visits           3 weeks ago Annual physical exam   [facility] Jerrol Banana., MD   6 months ago B12 deficiency   [facility] Jerrol Banana., MD   1 year ago Annual physical exam   [facility] Jerrol Banana., MD   1 year ago [place] Carles Collet M, [place]   1 year ago Acute left-sided thoracic back pain   [facility] Trinna Post, [place]       Future Appointments             In 5 days [place], Gordan Payment [place]   In 5 months Amalia Hailey, Nyoka Lint, MD [facility]

## 2019-08-14 NOTE — Progress Notes (Signed)
07/22/2018 1:36 PM   Tracey Wolf 10-06-1950 [id number]  Referring provider: Jerrol Banana., MD [address] [place],   [address]  Chief Complaint  Patient presents with  . Hematuria    1year    HPI: Patient is a 69 -year-old  female with a history of hematuria who presents today for follow up.  History of hematuria (high risk) Former smoker.  CTU 06/2017 No CT findings to explain the patient's history of lower abdominal pain and microscopic hematuria.  Aortic Atherosclerois (ICD10-170.0)  3 mm right lower lobe pulmonary nodule. Cystoscopy 06/2017 with Dr. Pilar Jarvis NED.  Has been evaluated by nephrology as well.  No reports of gross hematuria.  UA 08/03/2019 negative for microscopic hematuria.  She has a follow up visit with nephrology next week.     PMH: Past Medical History:  Diagnosis Date  . B12 deficiency   . Hyperlipidemia     Surgical History: Past Surgical History:  Procedure Laterality Date  . ABDOMINAL HYSTERECTOMY    . CERVICAL CONE BIOPSY    . COLONOSCOPY    . DILATION AND CURETTAGE OF UTERUS    . TUBAL LIGATION      Home Medications:  Allergies as of 08/15/2019      Reactions   Bee Venom    Cefaclor    Eggs Or Egg-derived Products Nausea And Vomiting   Iodinated Diagnostic Agents    Iohexol     Desc: had severe reaction in october '05   swelling, sob and throat closing   Doxycycline Rash   Latex Rash      Medication List       Accurate as of August 15, 2019  1:36 PM. If you have any questions, ask your nurse or doctor.        BD Disp Needles 25G X 5/8" Misc Generic drug: NEEDLE (DISP) 25 G USE AS DIRECTED WITH B12 SHOTS   cyanocobalamin 1000 MCG/ML injection Commonly known as: (VITAMIN B-12) TAKE AS DIRECTED   EPINEPHrine 0.3 mg/0.3 mL Soaj injection Commonly known as: EpiPen 2-Pak EPIPEN 2-PAK, 0.3MG /0.3ML (Injection Solution Auto-injector)  1 (one) Soln Auto-inj Soln Auto-inj Inject as needed for 0  days  Quantity: 1;  Refills: 12   Ordered :19-Jul-2014  Miguel Aschoff MD Miguel Aschoff MD;  Started [phone] Active Comments: Medication taken as needed.   estradiol 0.05 MG/24HR patch Commonly known as: VIVELLE-DOT Place 1 patch (0.05 mg total) onto the skin 2 (two) times a week.   ibuprofen 200 MG tablet Commonly known as: ADVIL Take 200 mg by mouth every 6 (six) hours as needed.   SAFETY-LOK 3CC SYR 25GX5/8" 25G X 5/8" 3 ML Misc Generic drug: SYRINGE-NEEDLE (DISP) 3 ML 1 each by Does not apply route See admin instructions. Use as directed   Vitamin D (Ergocalciferol) 1.25 MG (50000 UNIT) Caps capsule Commonly known as: DRISDOL TAKE 1 CAPSULE BY MOUTH EVERY WEEK       Allergies:  Allergies  Allergen Reactions  . Bee Venom   . Cefaclor   . Eggs Or Egg-Derived Products Nausea And Vomiting  . Iodinated Diagnostic Agents   . Iohexol      Desc: had severe reaction in october '05   swelling, sob and throat closing   . Doxycycline Rash  . Latex Rash    Family History: Family History  Problem Relation Age of Onset  . Diabetes Mother   . Heart attack Mother   . COPD Father   .  Pulmonary embolism Father   . Hyperlipidemia Sister   . Hyperlipidemia Brother   . Kidney disease Brother   . Kidney Stones Brother   . Hyperlipidemia Daughter   . Kidney cancer Neg Hx   . Prostate cancer Neg Hx   . Colon cancer Neg Hx   . Colon polyps Neg Hx   . Esophageal cancer Neg Hx   . Rectal cancer Neg Hx   . Stomach cancer Neg Hx     Social History:  reports that she quit smoking about 29 years ago. Her smoking use included cigarettes. She has a 10.00 pack-year smoking history. She has never used smokeless tobacco. She reports current alcohol use. She reports that she does not use drugs.  ROS: UROLOGY Frequent Urination?: No Hard to postpone urination?: No Burning/pain with urination?: No Get up at night to urinate?: No Leakage of urine?: No Urine stream starts and  stops?: No Trouble starting stream?: No Do you have to strain to urinate?: No Blood in urine?: Yes Urinary tract infection?: No Sexually transmitted disease?: No Injury to kidneys or bladder?: No Painful intercourse?: No Weak stream?: No Currently pregnant?: No Vaginal bleeding?: No Last menstrual period?: n  Gastrointestinal Nausea?: No Vomiting?: No Indigestion/heartburn?: No Diarrhea?: No Constipation?: No  Constitutional Fever: No Night sweats?: No Weight loss?: No Fatigue?: No  Skin Skin rash/lesions?: No Itching?: No  Eyes Blurred vision?: No Double vision?: No  Ears/Nose/Throat Sore throat?: No Sinus problems?: No  Hematologic/Lymphatic Swollen glands?: No Easy bruising?: No  Cardiovascular Leg swelling?: No Chest pain?: No  Respiratory Cough?: No Shortness of breath?: No  Endocrine Excessive thirst?: No  Musculoskeletal Back pain?: No Joint pain?: No  Neurological Headaches?: No Dizziness?: No  Psychologic Depression?: No Anxiety?: No  Physical Exam: BP (!) 141/75   Pulse 76   Ht 5\' 7"  (1.702 m)   Wt 133 lb (60.3 kg)   BMI 20.83 kg/m   Constitutional:  Well nourished. Alert and oriented, No acute distress. HEENT: [place] AT, moist mucus membranes.  Trachea midline, no masses. Cardiovascular: No clubbing, cyanosis, or edema. Respiratory: Normal respiratory effort, no increased work of breathing. Neurologic: Grossly intact, no focal deficits, moving all 4 extremities. Psychiatric: Normal mood and affect.   Urinalysis Component     Latest Ref Rng & Units 08/15/2019  Specific Gravity, UA     1.005 - 1.030 1.020  pH, UA     5.0 - 7.5 7.5  Color, UA     Yellow Yellow  Appearance Ur     Clear Cloudy (A)  Leukocytes,UA     Negative Negative  Protein,UA     Negative/Trace Negative  Glucose, UA     Negative Negative  Ketones, UA     Negative Negative  RBC, UA     Negative Trace (A)  Bilirubin, UA     Negative Negative    Urobilinogen, Ur     0.2 - 1.0 mg/dL 0.2  Nitrite, UA     Negative Negative  Microscopic Examination      See below:   Component     Latest Ref Rng & Units 08/15/2019  WBC, UA     0 - 5 /hpf None seen  RBC     0 - 2 /hpf 0-2  Epithelial Cells (non renal)     0 - 10 /hpf >10 (A)  Bacteria, UA     None seen/Few Few   I have reviewed the labs.  Assessment & Plan:  1. History of hematuria (high risk) Hematuria work up completed in 06/2017 - findings positive for NED No report of gross hematuria  UA today negative for hematuria She would like to continue her UA checks with Dr. Rosanna Randy as to reduce the number of providers she sees and I am agreeable to this.  She will have Dr. Rosanna Randy check an UA and if microscopic blood is seen or if she develops gross hematuria to be seen in our office for further evaluation as blood in the urine is a possible sign of cancer    Return if symptoms worsen or fail to improve.  These notes generated with voice recognition software. I apologize for typographical errors.  Zara Council, PA-C  [facility] [address] [place], [place], [place] [address] [phone]

## 2019-08-15 ENCOUNTER — Ambulatory Visit (INDEPENDENT_AMBULATORY_CARE_PROVIDER_SITE_OTHER): Payer: Commercial Managed Care - PPO | Admitting: Urology

## 2019-08-15 ENCOUNTER — Encounter: Payer: Self-pay | Admitting: Urology

## 2019-08-15 ENCOUNTER — Other Ambulatory Visit: Payer: Self-pay

## 2019-08-15 VITALS — BP 141/75 | HR 76 | Ht 67.0 in | Wt 133.0 lb

## 2019-08-15 DIAGNOSIS — Z87448 Personal history of other diseases of urinary system: Secondary | ICD-10-CM

## 2019-08-16 LAB — MICROSCOPIC EXAMINATION
Epithelial Cells (non renal): >10 /hpf — AB (ref 0–10)
WBC, UA: NONE SEEN /hpf (ref 0–5)

## 2019-08-16 LAB — URINALYSIS, COMPLETE
Bilirubin, UA: NEGATIVE
Glucose, UA: NEGATIVE
Ketones, UA: NEGATIVE
Leukocytes,UA: NEGATIVE
Nitrite, UA: NEGATIVE
Protein,UA: NEGATIVE
Specific Gravity, UA: 1.02 (ref 1.005–1.030)
Urobilinogen, Ur: 0.2 mg/dL (ref 0.2–1.0)
pH, UA: 7.5 (ref 5.0–7.5)

## 2019-09-13 ENCOUNTER — Other Ambulatory Visit: Payer: Self-pay | Admitting: Family Medicine

## 2019-09-13 NOTE — Telephone Encounter (Signed)
Requested medication (s) are due for refill today:   Not sure    It looks like it was renewed by Dr. Zara Council on 08/15/2019 with 12 refills however there is a note saying pt is no longer taking this.   Requested medication (s) are on the active medication list:   Yes  Future visit scheduled:   No   Had CPE 1 month ago with Dr. Rosanna Randy   Last ordered: 09/05/2018  with 5 refills by Dr. Rosanna Randy..    Returned because was not sure who is prescribing this.   Requested Prescriptions  Pending Prescriptions Disp Refills   EPINEPHRINE 0.3 mg/0.3 mL IJ SOAJ injection [Pharmacy Med Name: EPINEPHRINE 0.3MG  INJ 2 PACK] 2 each     Sig: INJECT INTRAMUSCULARLY AS DIRECTED      Immunology: Antidotes Passed - 09/13/2019  3:30 AM      Passed - Valid encounter within last 12 months    Recent Outpatient Visits           1 month ago Annual physical exam   [facility] Jerrol Banana., MD   7 months ago B12 deficiency   [facility] Jerrol Banana., MD   1 year ago Annual physical exam   [facility] Jerrol Banana., MD   1 year ago [place] Carles Collet M, [place]   1 year ago Acute left-sided thoracic back pain   [facility] Trinna Post, [place]       Future Appointments             In 4 months Amalia Hailey, Nyoka Lint, MD [facility]

## 2019-09-18 DIAGNOSIS — R3129 Other microscopic hematuria: Secondary | ICD-10-CM | POA: Insufficient documentation

## 2019-12-15 ENCOUNTER — Other Ambulatory Visit: Payer: Self-pay | Admitting: Obstetrics and Gynecology

## 2019-12-15 DIAGNOSIS — N941 Unspecified dyspareunia: Secondary | ICD-10-CM

## 2019-12-15 DIAGNOSIS — N952 Postmenopausal atrophic vaginitis: Secondary | ICD-10-CM

## 2020-01-15 ENCOUNTER — Encounter: Payer: Self-pay | Admitting: Family Medicine

## 2020-01-19 ENCOUNTER — Encounter: Payer: Commercial Managed Care - PPO | Admitting: Obstetrics and Gynecology

## 2020-01-24 ENCOUNTER — Encounter: Payer: Self-pay | Admitting: Obstetrics and Gynecology

## 2020-01-24 ENCOUNTER — Other Ambulatory Visit: Payer: Self-pay

## 2020-01-24 ENCOUNTER — Ambulatory Visit (INDEPENDENT_AMBULATORY_CARE_PROVIDER_SITE_OTHER): Payer: Medicare Other | Admitting: Obstetrics and Gynecology

## 2020-01-24 VITALS — BP 151/80 | HR 73 | Ht 67.0 in | Wt 134.0 lb

## 2020-01-24 DIAGNOSIS — Z1231 Encounter for screening mammogram for malignant neoplasm of breast: Secondary | ICD-10-CM | POA: Diagnosis not present

## 2020-01-24 DIAGNOSIS — N941 Unspecified dyspareunia: Secondary | ICD-10-CM

## 2020-01-24 DIAGNOSIS — Z01419 Encounter for gynecological examination (general) (routine) without abnormal findings: Secondary | ICD-10-CM

## 2020-01-24 DIAGNOSIS — Z79890 Hormone replacement therapy (postmenopausal): Secondary | ICD-10-CM

## 2020-01-24 DIAGNOSIS — N952 Postmenopausal atrophic vaginitis: Secondary | ICD-10-CM

## 2020-01-24 MED ORDER — ESTRADIOL 0.05 MG/24HR TD PTTW: MEDICATED_PATCH | TRANSDERMAL | 2 refills | Status: DC

## 2020-01-24 NOTE — Progress Notes (Signed)
HPI:      [name] is a 69 y.o. G2P1011 who LMP was No LMP recorded. Patient has had a hysterectomy.  Subjective:   She presents today for her annual examination.  She is doing well and has no complaints.  She continues to use Vivelle-Dot successfully.  She would like to continue its use. She states she is occasionally using calcium supplementation.  She does take regular vitamin D. She denies pelvic pain. (History of endometriosis and right-sided pain intermittently with exam)    Hx: The following portions of the patient's history were reviewed and updated as appropriate:             She  has a past medical history of B12 deficiency and Hyperlipidemia. She does not have any pertinent problems on file. She  has a past surgical history that includes Abdominal hysterectomy; Tubal ligation; Dilation and curettage of uterus; Colonoscopy; and Cervical cone biopsy. Her family history includes COPD in her father; Diabetes in her mother; Heart attack in her mother; Hyperlipidemia in her brother, daughter, and sister; Kidney Stones in her brother; Kidney disease in her brother; Pulmonary embolism in her father. She  reports that she quit smoking about 29 years ago. Her smoking use included cigarettes. She has a 10.00 pack-year smoking history. She has never used smokeless tobacco. She reports current alcohol use. She reports that she does not use drugs. She has a current medication list which includes the following prescription(s): bd disp needles, cyanocobalamin, epinephrine, estradiol, ibuprofen, safety-lok 3cc syr 25gx5/8", and vitamin d (ergocalciferol), and the following Facility-Administered Medications: sodium chloride, ciprofloxacin, and lidocaine. She is allergic to bee venom, cefaclor, eggs or egg-derived products, iodinated diagnostic agents, iohexol, doxycycline, and latex.       Review of Systems:  Review of Systems  Constitutional: Denied constitutional symptoms, night sweats,  recent illness, fatigue, fever, insomnia and weight loss.  Eyes: Denied eye symptoms, eye pain, photophobia, vision change and visual disturbance.  Ears/Nose/Throat/Neck: Denied ear, nose, throat or neck symptoms, hearing loss, nasal discharge, sinus congestion and sore throat.  Cardiovascular: Denied cardiovascular symptoms, arrhythmia, chest pain/pressure, edema, exercise intolerance, orthopnea and palpitations.  Respiratory: Denied pulmonary symptoms, asthma, pleuritic pain, productive sputum, cough, dyspnea and wheezing.  Gastrointestinal: Denied, gastro-esophageal reflux, melena, nausea and vomiting.  Genitourinary: Denied genitourinary symptoms including symptomatic vaginal discharge, pelvic relaxation issues, and urinary complaints.  Musculoskeletal: Denied musculoskeletal symptoms, stiffness, swelling, muscle weakness and myalgia.  Dermatologic: Denied dermatology symptoms, rash and scar.  Neurologic: Denied neurology symptoms, dizziness, headache, neck pain and syncope.  Psychiatric: Denied psychiatric symptoms, anxiety and depression.  Endocrine: Denied endocrine symptoms including hot flashes and night sweats.   Meds:   Current Outpatient Medications on File Prior to Visit  Medication Sig Dispense Refill  . BD DISP NEEDLES 25G X 5/8" MISC USE AS DIRECTED WITH B12 SHOTS    . cyanocobalamin (,VITAMIN B-12,) 1000 MCG/ML injection TAKE AS DIRECTED 10 mL 5  . EPINEPHrine 0.3 mg/0.3 mL IJ SOAJ injection INJECT INTRAMUSCULARLY AS DIRECTED 2 each 3  . estradiol (VIVELLE-DOT) 0.05 MG/24HR patch APPLY 1 PATCH(0.05 MG) EXTERNALLY TO THE SKIN 2 TIMES A WEEK 24 patch 0  . ibuprofen (ADVIL,MOTRIN) 200 MG tablet Take 200 mg by mouth every 6 (six) hours as needed.    . SYRINGE-NEEDLE, DISP, 3 ML (SAFETY-LOK 3CC SYR 25GX5/8") 25G X 5/8" 3 ML MISC 1 each by Does not apply route See admin instructions. Use as directed 50 each 12  . Vitamin  D, Ergocalciferol, (DRISDOL) 1.25 MG (50000 UT) CAPS capsule  TAKE 1 CAPSULE BY MOUTH EVERY WEEK 4 capsule 12   Current Facility-Administered Medications on File Prior to Visit  Medication Dose Route Frequency Provider Last Rate Last Admin  . 0.9 %  sodium chloride infusion  500 mL Intravenous Once Irene Shipper, MD      . ciprofloxacin (CIPRO) tablet 500 mg  500 mg Oral Once Nickie Retort, MD      . lidocaine (XYLOCAINE) 2 % jelly 1 application  1 application Urethral Once Nickie Retort, MD        Objective:     Vitals:   01/24/20 1007  BP: (!) 151/80  Pulse: 73              Physical examination General NAD, Conversant  HEENT Atraumatic; Op clear with mmm.  Normo-cephalic. Pupils reactive. Anicteric sclerae  Thyroid/Neck Smooth without nodularity or enlargement. Normal ROM.  Neck Supple.  Skin No rashes, lesions or ulceration. Normal palpated skin turgor. No nodularity.  Breasts: No masses or discharge.  Symmetric.  No axillary adenopathy.  Lungs: Clear to auscultation.No rales or wheezes. Normal Respiratory effort, no retractions.  Heart: NSR.  No murmurs or rubs appreciated. No periferal edema  Abdomen: Soft.  Non-tender.  No masses.  No HSM. No hernia  Extremities: Moves all appropriately.  Normal ROM for age. No lymphadenopathy.  Neuro: Oriented to PPT.  Normal mood. Normal affect.     Pelvic:   Vulva: Normal appearance.  No lesions.   Vagina: No lesions or abnormalities noted.  Support: Normal pelvic support.  Urethra No masses tenderness or scarring.  Meatus Normal size without lesions or prolapse.  Cervix: Surgically absent   Anus: Normal exam.  No lesions.  Perineum: Normal exam.  No lesions.        Bimanual   Uterus: Surgically absent   Adnexae: No masses.  Non-tender to palpation.  Cul-de-sac: Negative for abnormality.     Assessment:    G2P1011 Patient Active Problem List   Diagnosis Date Noted  . Postmenopausal 01/21/2019  . Cough 08/21/2016  . Endometriosis 04/25/2015  . Personal history of  reproductive and obstetrical problems 04/25/2015  . HLD (hyperlipidemia) 04/25/2015  . B12 deficiency 04/25/2015     1. Well woman exam with routine gynecological exam   2. Encounter for screening mammogram for malignant neoplasm of breast   3. Postmenopausal hormone therapy     Doing well on HRT patient would like to continue.     Plan:            1.  Basic Screening Recommendations The basic screening recommendations for asymptomatic women were discussed with the patient during her visit.  The age-appropriate recommendations were discussed with her and the rational for the tests reviewed.  When I am informed by the patient that another primary care physician has previously obtained the age-appropriate tests and they are up-to-date, only outstanding tests are ordered and referrals given as necessary.  Abnormal results of tests will be discussed with her when all of her results are completed.  Routine preventative health maintenance measures emphasized: Exercise/Diet/Weight control, Tobacco Warnings, Alcohol/Substance use risks and Stress Management 2.  Continue Vivelle dot 3.  Recommend daily calcium supplementation.  Continue weightbearing exercise. Orders Orders Placed This Encounter  Procedures  . MM 3D SCREEN BREAST BILATERAL    No orders of the defined types were placed in this encounter.       F/U  No follow-ups on file.  Finis Bud, M.D. 01/24/2020 11:15 AM

## 2020-02-26 ENCOUNTER — Other Ambulatory Visit: Payer: Self-pay

## 2020-02-26 ENCOUNTER — Ambulatory Visit
Admission: RE | Admit: 2020-02-26 | Discharge: 2020-02-26 | Disposition: A | Discharge status: Home / Self Care | Payer: Medicare Other | Source: Ambulatory Visit | Attending: Obstetrics and Gynecology | Admitting: Obstetrics and Gynecology

## 2020-02-26 DIAGNOSIS — Z1231 Encounter for screening mammogram for malignant neoplasm of breast: Secondary | ICD-10-CM

## 2020-02-28 ENCOUNTER — Other Ambulatory Visit: Payer: Self-pay | Admitting: Obstetrics and Gynecology

## 2020-02-28 DIAGNOSIS — R928 Other abnormal and inconclusive findings on diagnostic imaging of breast: Secondary | ICD-10-CM

## 2020-03-04 ENCOUNTER — Other Ambulatory Visit: Payer: Self-pay

## 2020-03-04 ENCOUNTER — Ambulatory Visit
Admission: RE | Admit: 2020-03-04 | Discharge: 2020-03-04 | Disposition: A | Discharge status: Home / Self Care | Payer: Medicare Other | Source: Ambulatory Visit | Attending: Obstetrics and Gynecology | Admitting: Obstetrics and Gynecology

## 2020-03-04 DIAGNOSIS — R928 Other abnormal and inconclusive findings on diagnostic imaging of breast: Secondary | ICD-10-CM

## 2020-03-04 DIAGNOSIS — R922 Inconclusive mammogram: Secondary | ICD-10-CM | POA: Diagnosis not present

## 2020-03-04 DIAGNOSIS — N6001 Solitary cyst of right breast: Secondary | ICD-10-CM | POA: Diagnosis not present

## 2020-03-10 ENCOUNTER — Other Ambulatory Visit: Payer: Self-pay | Admitting: Obstetrics and Gynecology

## 2020-03-10 DIAGNOSIS — N952 Postmenopausal atrophic vaginitis: Secondary | ICD-10-CM

## 2020-03-10 DIAGNOSIS — N941 Unspecified dyspareunia: Secondary | ICD-10-CM

## 2020-04-25 ENCOUNTER — Telehealth: Payer: Self-pay

## 2020-04-25 DIAGNOSIS — E538 Deficiency of other specified B group vitamins: Secondary | ICD-10-CM

## 2020-04-25 MED ORDER — CYANOCOBALAMIN 1000 MCG/ML IJ SOLN: INTRAMUSCULAR | 5 refills | Status: DC

## 2020-04-25 NOTE — Telephone Encounter (Signed)
gilCopied from [place] [id number]. Topic: General - Inquiry >> Apr 24, 2020  4:21 PM Gillis Ends D wrote: Reason for CRM: Patient needs a new prescription sent to her pharmacy that states the correct way she takes her B12 injection. She uses it every 4 days. The pharmacy is denying her a refill because the directions aren't specific. Can you send it to the [facility] on [address]. Any questions call the patient at [phone]. Please advise

## 2020-04-25 NOTE — Telephone Encounter (Signed)
Rx resent to pharmacy

## 2020-05-26 ENCOUNTER — Other Ambulatory Visit: Payer: Self-pay | Admitting: Family Medicine

## 2020-05-26 NOTE — Telephone Encounter (Signed)
Requested medication (s) are due for refill today: yes  Requested medication (s) are on the active medication list: yes  Last refill:  05/06/19  Future visit scheduled: yes  Notes to clinic:  med not delegated to NT to RF   Requested Prescriptions  Pending Prescriptions Disp Refills   Vitamin D, Ergocalciferol, (DRISDOL) 1.25 MG (50000 UNIT) CAPS capsule [Pharmacy Med Name: VITAMIN D2 50,000IU (ERGO) CAP RX] 4 capsule 12    Sig: TAKE [place]      Endocrinology:  Vitamins - Vitamin D Supplementation Failed - 05/26/2020 12:24 PM      Failed - 50,000 IU strengths are not delegated      Failed - Ca in normal range and within 360 days    Calcium  Date Value Ref Range Status  08/05/2018 8.7 8.7 - 10.3 mg/dL Final          Failed - Phosphate in normal range and within 360 days    No results found for: PHOS        Failed - Vitamin D in normal range and within 360 days    Vit D, 25-Hydroxy  Date Value Ref Range Status  07/23/2016 39.9 30.0 - 100.0 ng/mL Final    Comment:    Vitamin D deficiency has been defined by the Institute of Medicine and an Endocrine Society practice guideline as a level of serum 25-OH vitamin D less than 20 ng/mL (1,2). The Endocrine Society went on to further define vitamin D insufficiency as a level between 21 and 29 ng/mL (2). 1. IOM (Institute of Medicine). 2010. Dietary reference    intakes for calcium and D. [place]: The    [employer]. 2. Holick MF, Bink[place], Bischoff-Ferrari HA, et al.    Evaluation, treatment, and prevention of vitamin D    deficiency: an Endocrine Society clinical practice    guideline. JCEM. 2011 Jul; 96(7):1911-30.           Passed - Valid encounter within last 12 months    Recent Outpatient Visits           10 months ago Annual physical exam   [facility] Jerrol Banana., MD   1 year ago B12 deficiency   [facility] Jerrol Banana., MD   1 year ago Annual physical exam   [facility] Jerrol Banana., MD   1 year ago [facility] Col[facility] M, [place]   1 year ago Acute left-sided thoracic back pain   [facility] Trinna Post, [place]       Future Appointments             In 1 month Jerrol Banana., MD [facility] [place]   In 8 months Amalia Hailey, Nyoka Lint, MD [facility]

## 2020-05-27 NOTE — Telephone Encounter (Signed)
Tracey Wolf

## 2020-06-19 ENCOUNTER — Telehealth: Payer: Self-pay | Admitting: Family Medicine

## 2020-06-19 DIAGNOSIS — E538 Deficiency of other specified B group vitamins: Secondary | ICD-10-CM

## 2020-06-19 NOTE — Telephone Encounter (Signed)
Pt stated that her insurance [facility] needs provider to call them directly with the cyanocobalamin (,VITAMIN B-12,) 1000 MCG/ML injection Directions / Pt states they are not filling her Rx like they should and she needs this fixed asap /and needs a refill / Pt was going to discuss this at her CPE appt but it had to be rescheduled and is now in February / please call [employer] at [phone] or pharmacist line at [phone]/ please advise

## 2020-06-19 NOTE — Telephone Encounter (Signed)
Ok to refill B12 injections? If so, how often and quantity? Thanks!

## 2020-06-24 NOTE — Telephone Encounter (Signed)
OK to refill to take every 6 days. thx

## 2020-06-25 MED ORDER — CYANOCOBALAMIN 1000 MCG/ML IJ SOLN: INTRAMUSCULAR | 5 refills | Status: DC

## 2020-06-25 NOTE — Telephone Encounter (Signed)
Medication sent.

## 2020-07-22 ENCOUNTER — Encounter: Payer: Self-pay | Admitting: Family Medicine

## 2020-08-15 DIAGNOSIS — H2513 Age-related nuclear cataract, bilateral: Secondary | ICD-10-CM | POA: Diagnosis not present

## 2020-08-19 ENCOUNTER — Other Ambulatory Visit: Payer: Self-pay | Admitting: Family Medicine

## 2020-08-29 ENCOUNTER — Other Ambulatory Visit: Payer: Self-pay | Admitting: Obstetrics and Gynecology

## 2020-08-29 DIAGNOSIS — N952 Postmenopausal atrophic vaginitis: Secondary | ICD-10-CM

## 2020-08-29 DIAGNOSIS — N941 Unspecified dyspareunia: Secondary | ICD-10-CM

## 2020-09-27 NOTE — Progress Notes (Addendum)
Annual Wellness Visit     [name], Female    DOB: April 22, 1951, 70 y.o.   MRN: [id number] Visit Date: 09/30/2020  Today's Provider: Wilhemena Durie, MD   Chief Complaint  Patient presents with  . Annual Exam   Subjective    Tracey Wolf is a 70 y.o. female who presents today for her Annual Wellness Visit.CPE She reports consuming a general diet. The patient does not participate in regular exercise at present. She generally feels well. She reports sleeping well. She does not have additional problems to discuss today.  In general she is doing well.  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  She has 1 daughter no grandchildren.      Medications: Outpatient Medications Prior to Visit  Medication Sig  . cyanocobalamin (,VITAMIN B-12,) 1000 MCG/ML injection Inject 1 mL into skin every 6 days.  Marland Kitchen EPINEPHrine 0.3 mg/0.3 mL IJ SOAJ injection INJECT INTRAMUSCULARLY AS DIRECTED  . estradiol (VIVELLE-DOT) 0.05 MG/24HR patch APPLY 1 PATCH(0.05 MG) TOPICALLY TO THE SKIN 2 TIMES A WEEK  . ibuprofen (ADVIL,MOTRIN) 200 MG tablet Take 200 mg by mouth every 6 (six) hours as needed.  . Vitamin D, Ergocalciferol, (DRISDOL) 1.25 MG (50000 UNIT) CAPS capsule TAKE 1 CAPSULE BY MOUTH EVERY WEEK  . B-D 3CC LUER-LOK SYR 25GX5/8" 25G X 5/8" 3 ML MISC USE AS DIRECTED  . BD DISP NEEDLES 25G X 5/8" MISC USE AS DIRECTED WITH B12 SHOTS   Facility-Administered Medications Prior to Visit  Medication Dose Route Frequency Provider  . 0.9 %  sodium chloride infusion  500 mL Intravenous Once Irene Shipper, MD  . ciprofloxacin (CIPRO) tablet 500 mg  500 mg Oral Once Nickie Retort, MD  . lidocaine (XYLOCAINE) 2 % jelly 1 application  1 application Urethral Once Nickie Retort, MD    Allergies  Allergen Reactions  . Bee Venom   . Cefaclor   . Eggs Or  Egg-Derived Products Nausea And Vomiting  . Iodinated Diagnostic Agents   . Iohexol      Desc: had severe reaction in october '05   swelling, sob and throat closing   . Doxycycline Rash  . Latex Rash    Patient Care Team: Jerrol Banana., MD as PCP - General (Family Medicine)  Review of Systems  All other systems reviewed and are negative.   Last vitamin B12 and Folate Lab Results  Component Value Date   VITAMINB12 >2000 (H) 08/05/2018      Objective    Vitals: BP 139/64   Pulse 64   Temp 97.9 F (36.6 C)   Resp 16   Ht 5\' 7"  (1.702 m)   Wt 133 lb (60.3 kg)   BMI 20.83 kg/m  Wt Readings from Last 3 Encounters:  09/30/20 133 lb (60.3 kg)  01/24/20 134 lb (60.8 kg)  08/15/19 133 lb (60.3 kg)      Physical Exam Vitals reviewed.  Constitutional:      Appearance: She is well-developed.     Comments: She appears younger than her age  HENT:     Head: Normocephalic and atraumatic.     Right Ear: External ear normal.     Left Ear: External ear normal.     Nose: Nose normal.  Eyes:  Conjunctiva/sclera: Conjunctivae normal.     Pupils: Pupils are equal, round, and reactive to light.  Cardiovascular:     Rate and Rhythm: Normal rate and regular rhythm.     Heart sounds: Normal heart sounds. No murmur heard. No gallop.   Pulmonary:     Effort: Pulmonary effort is normal. No respiratory distress.     Breath sounds: Normal breath sounds. No wheezing.  Abdominal:     General: There is no distension.     Palpations: Abdomen is soft.     Tenderness: There is no abdominal tenderness.  Musculoskeletal:        General: No tenderness.     Cervical back: Normal range of motion and neck supple.     Right lower leg: No edema.     Left lower leg: No edema.  Skin:    General: Skin is warm and dry.     Findings: No erythema or rash.  Neurological:     General: No focal deficit present.     Mental Status: She is alert and oriented to person, place, and time.      Cranial Nerves: No cranial nerve deficit.  Psychiatric:        Mood and Affect: Mood normal.        Behavior: Behavior normal.        Thought Content: Thought content normal.        Judgment: Judgment normal.      Most recent functional status assessment: No flowsheet data found. Most recent fall risk assessment: Fall Risk  07/20/2019  Falls in the past year? 0  Number falls in past yr: 0  Injury with Fall? 0  Follow up Falls evaluation completed    Most recent depression screenings: PHQ 2/9 Scores 07/20/2019 07/18/2018  PHQ - 2 Score 0 0  PHQ- 9 Score 0 -   Most recent cognitive screening: No flowsheet data found. Most recent Audit-C alcohol use screening Alcohol Use Disorder Test (AUDIT) 07/20/2019  1. How often do you have a drink containing alcohol? 2  2. How many drinks containing alcohol do you have on a typical day when you are drinking? 0  3. How often do you have six or more drinks on one occasion? 0  AUDIT-C Score 2  Alcohol Brief Interventions/Follow-up -   A score of 3 or more in women, and 4 or more in men indicates increased risk for alcohol abuse, EXCEPT if all of the points are from question 1   No results found for any visits on 09/30/20.  Assessment & Plan     Annual wellness visit done today including the all of the following: Reviewed patient's Family Medical History Reviewed and updated list of patient's medical providers Assessment of cognitive impairment was done Assessed patient's functional ability Established a written schedule for health screening Alicia Completed and Reviewed  Exercise Activities and Dietary recommendations Goals   None     Immunization History  Administered Date(s) Administered  . Moderna Sars-Covid-2 Vaccination 09/15/2019, 10/13/2019  . Pneumococcal Conjugate-13 06/09/2017  . Pneumococcal Polysaccharide-23 08/05/2018  . Td 09/17/2004  . Tdap 06/19/2015  . Zoster 05/03/2012     Health Maintenance  Topic Date Due  . COVID-19 Vaccine (3 - Booster for Moderna series) 04/14/2020  . INFLUENZA VACCINE  04/21/2021 (Originally 03/03/2020)  . MAMMOGRAM  02/25/2022  . TETANUS/TDAP  06/18/2025  . COLONOSCOPY (Pts 45-20yrs Insurance coverage will need to be confirmed)  08/26/2027  . DEXA SCAN  Completed  . Hepatitis C Screening  Completed  . PNA vac Low Risk Adult  Completed     Discussed health benefits of physical activity, and encouraged her to engage in regular exercise appropriate for her age and condition.   1. Medicare annual wellness visit, subsequent   2. Annual physical exam Well woman exam per Dr. Amalia Hailey. Dermatology per Dr. Kellie Moor  3. Mixed hyperlipidemia  - Lipid panel - TSH - CBC w/Diff/Platelet - Comprehensive Metabolic Panel (CMET)  4. Postmenopausal   5. Tinnitus, unspecified laterality With hearing loss but no dizziness.  Refer to ENT/audiology - Ambulatory referral to ENT  6. B12 deficiency Shots back to every 6 days.  When I see her back next we will try to go back to every 7 days  7. Egg allergy    No follow-ups on file.     I, Wilhemena Durie, MD, have reviewed all documentation for this visit. The documentation on 09/30/20 for the exam, diagnosis, procedures, and orders are all accurate and complete.    Myrl Lazarus Cranford Mon, MD  [facility] [phone] (phone) [phone] (fax)  [place]

## 2020-09-30 ENCOUNTER — Encounter: Payer: Self-pay | Admitting: Family Medicine

## 2020-09-30 ENCOUNTER — Ambulatory Visit (INDEPENDENT_AMBULATORY_CARE_PROVIDER_SITE_OTHER): Payer: Medicare Other | Admitting: Family Medicine

## 2020-09-30 ENCOUNTER — Other Ambulatory Visit: Payer: Self-pay

## 2020-09-30 VITALS — BP 139/64 | HR 64 | Temp 97.9°F | Resp 16 | Ht 67.0 in | Wt 133.0 lb

## 2020-09-30 DIAGNOSIS — Z91012 Allergy to eggs: Secondary | ICD-10-CM | POA: Diagnosis not present

## 2020-09-30 DIAGNOSIS — H9319 Tinnitus, unspecified ear: Secondary | ICD-10-CM | POA: Diagnosis not present

## 2020-09-30 DIAGNOSIS — E538 Deficiency of other specified B group vitamins: Secondary | ICD-10-CM

## 2020-09-30 DIAGNOSIS — E782 Mixed hyperlipidemia: Secondary | ICD-10-CM

## 2020-09-30 DIAGNOSIS — Z0000 Encounter for general adult medical examination without abnormal findings: Secondary | ICD-10-CM | POA: Diagnosis not present

## 2020-09-30 DIAGNOSIS — Z780 Asymptomatic menopausal state: Secondary | ICD-10-CM | POA: Diagnosis not present

## 2020-10-04 DIAGNOSIS — E782 Mixed hyperlipidemia: Secondary | ICD-10-CM | POA: Diagnosis not present

## 2020-10-05 ENCOUNTER — Encounter: Payer: Self-pay | Admitting: Family Medicine

## 2020-10-05 LAB — CBC WITH DIFFERENTIAL/PLATELET
Basophils Absolute: 0 10*3/uL (ref 0.0–0.2)
Basos: 0 %
EOS (ABSOLUTE): 0 10*3/uL (ref 0.0–0.4)
Eos: 1 %
Hematocrit: 39.6 % (ref 34.0–46.6)
Hemoglobin: 12.9 g/dL (ref 11.1–15.9)
Immature Grans (Abs): 0 10*3/uL (ref 0.0–0.1)
Immature Granulocytes: 0 %
Lymphocytes Absolute: 1.3 10*3/uL (ref 0.7–3.1)
Lymphs: 25 %
MCH: 28.7 pg (ref 26.6–33.0)
MCHC: 32.6 g/dL (ref 31.5–35.7)
MCV: 88 fL (ref 79–97)
Monocytes Absolute: 0.3 10*3/uL (ref 0.1–0.9)
Monocytes: 7 %
Neutrophils Absolute: 3.3 10*3/uL (ref 1.4–7.0)
Neutrophils: 67 %
Platelets: 192 10*3/uL (ref 150–450)
RBC: 4.5 x10E6/uL (ref 3.77–5.28)
RDW: 14 % (ref 11.7–15.4)
WBC: 4.9 10*3/uL (ref 3.4–10.8)

## 2020-10-05 LAB — COMPREHENSIVE METABOLIC PANEL
ALT: 8 IU/L (ref 0–32)
AST: 20 IU/L (ref 0–40)
Albumin/Globulin Ratio: 2 (ref 1.2–2.2)
Albumin: 4.6 g/dL (ref 3.8–4.8)
Alkaline Phosphatase: 117 IU/L (ref 44–121)
BUN/Creatinine Ratio: 20 (ref 12–28)
BUN: 18 mg/dL (ref 8–27)
Bilirubin Total: 0.4 mg/dL (ref 0.0–1.2)
CO2: 20 mmol/L (ref 20–29)
Calcium: 9.3 mg/dL (ref 8.7–10.3)
Chloride: 103 mmol/L (ref 96–106)
Creatinine, Ser: 0.9 mg/dL (ref 0.57–1.00)
Globulin, Total: 2.3 g/dL (ref 1.5–4.5)
Glucose: 94 mg/dL (ref 65–99)
Potassium: 5 mmol/L (ref 3.5–5.2)
Sodium: 140 mmol/L (ref 134–144)
Total Protein: 6.9 g/dL (ref 6.0–8.5)
eGFR: 69 mL/min/{1.73_m2} (ref >59)

## 2020-10-05 LAB — TSH: TSH: 2.73 u[IU]/mL (ref 0.450–4.500)

## 2020-10-05 LAB — LIPID PANEL
Chol/HDL Ratio: 5.2 ratio — ABNORMAL HIGH (ref 0.0–4.4)
Cholesterol, Total: 264 mg/dL — ABNORMAL HIGH (ref 100–199)
HDL: 51 mg/dL (ref >39)
LDL Chol Calc (NIH): 164 mg/dL — ABNORMAL HIGH (ref 0–99)
Triglycerides: 263 mg/dL — ABNORMAL HIGH (ref 0–149)
VLDL Cholesterol Cal: 49 mg/dL — ABNORMAL HIGH (ref 5–40)

## 2020-10-18 DIAGNOSIS — H9313 Tinnitus, bilateral: Secondary | ICD-10-CM | POA: Diagnosis not present

## 2020-10-18 DIAGNOSIS — H903 Sensorineural hearing loss, bilateral: Secondary | ICD-10-CM | POA: Diagnosis not present

## 2020-10-29 NOTE — Progress Notes (Signed)
This encounter was created in error - please disregard.

## 2020-11-06 DIAGNOSIS — H905 Unspecified sensorineural hearing loss: Secondary | ICD-10-CM | POA: Diagnosis not present

## 2021-01-20 ENCOUNTER — Other Ambulatory Visit: Payer: Self-pay | Admitting: Obstetrics and Gynecology

## 2021-01-20 DIAGNOSIS — Z1231 Encounter for screening mammogram for malignant neoplasm of breast: Secondary | ICD-10-CM

## 2021-01-27 NOTE — Progress Notes (Signed)
07/22/2018 4:32 PM   Ilda Foil [name] 1950/12/02 [id number]  Referring provider: Jerrol Banana., MD [address] [place],  [place] [address]  Urological history: 1. High risk hematuria -former smoker -CTU 2018 NED -cysto 2018 NED -seen by nephrology-no recommendations -no reports of gross hematuria  -UA negative for micro heme  Chief Complaint  Patient presents with   Dysuria   HPI: Tracey Wolf is a 70 y.o. female who presents today for follow up.   She has been experiencing urgency, frequency, dysuria, genital swelling and two episodes of urge incontinence over the last three weeks.  Patient denies any modifying or aggravating factors.  Patient denies any gross hematuria or suprapubic/flank pain.  Patient denies any fevers, chills, nausea or vomiting.    UA moderate bacteria and yeast present   PMH: Past Medical History:  Diagnosis Date   B12 deficiency    Hyperlipidemia     Surgical History: Past Surgical History:  Procedure Laterality Date   ABDOMINAL HYSTERECTOMY     CERVICAL CONE BIOPSY     COLONOSCOPY     DILATION AND CURETTAGE OF UTERUS     TUBAL LIGATION      Home Medications:  Allergies as of 01/28/2021       Reactions   Bee Venom    Cefaclor    Eggs Or Egg-derived Products Nausea And Vomiting   Iodinated Diagnostic Agents    Iohexol     Desc: had severe reaction in october '05   swelling, sob and throat closing   Doxycycline Rash   Latex Rash        Medication List        Accurate as of January 28, 2021 11:59 PM. If you have any questions, ask your nurse or doctor.          B-D 3CC LUER-LOK SYR 25GX5/8" 25G X 5/8" 3 ML Misc Generic drug: SYRINGE-NEEDLE (DISP) 3 ML USE AS DIRECTED   BD Disp Needles 25G X 5/8" Misc Generic drug: NEEDLE (DISP) 25 G USE AS DIRECTED WITH B12 SHOTS   cyanocobalamin 1000 MCG/ML injection Commonly known as: (VITAMIN B-12) Inject 1 mL into skin every 6 days.   EPINEPHrine  0.3 mg/0.3 mL Soaj injection Commonly known as: EPI-PEN INJECT INTRAMUSCULARLY AS DIRECTED   estradiol 0.05 MG/24HR patch Commonly known as: VIVELLE-DOT APPLY 1 PATCH(0.05 MG) TOPICALLY TO THE SKIN 2 TIMES A WEEK   ibuprofen 200 MG tablet Commonly known as: ADVIL Take 200 mg by mouth every 6 (six) hours as needed.   sulfamethoxazole-trimethoprim 800-160 MG tablet Commonly known as: BACTRIM DS Take 1 tablet by mouth every 12 (twelve) hours. Started by: Zara Council, PA-C   Vitamin D (Ergocalciferol) 1.25 MG (50000 UNIT) Caps capsule Commonly known as: DRISDOL TAKE 1 CAPSULE BY MOUTH EVERY WEEK        Allergies:  Allergies  Allergen Reactions   Bee Venom    Cefaclor    Eggs Or Egg-Derived Products Nausea And Vomiting   Iodinated Diagnostic Agents    Iohexol      Desc: had severe reaction in october '05   swelling, sob and throat closing    Doxycycline Rash   Latex Rash    Family History: Family History  Problem Relation Age of Onset   Diabetes Mother    Heart attack Mother    COPD Father    Pulmonary embolism Father    Hyperlipidemia Sister    Hyperlipidemia Brother  Kidney disease Brother    Kidney Stones Brother    Hyperlipidemia Daughter    Kidney cancer Neg Hx    Prostate cancer Neg Hx    Colon cancer Neg Hx    Colon polyps Neg Hx    Esophageal cancer Neg Hx    Rectal cancer Neg Hx    Stomach cancer Neg Hx     Social History:  reports that she quit smoking about 30 years ago. Her smoking use included cigarettes. She has a 10.00 pack-year smoking history. She has never used smokeless tobacco. She reports current alcohol use. She reports that she does not use drugs.  ROS: For pertinent review of systems please refer to history of present illness   Physical Exam: BP (!) 166/78   Pulse 79   Ht 5\' 7"  (1.702 m)   Wt 133 lb (60.3 kg)   BMI 20.83 kg/m   Constitutional:  Well nourished. Alert and oriented, No acute distress. HEENT: [place] AT, mask in  place.  Trachea midline Cardiovascular: No clubbing, cyanosis, or edema. Respiratory: Normal respiratory effort, no increased work of breathing. Neurologic: Grossly intact, no focal deficits, moving all 4 extremities. Psychiatric: Normal mood and affect.    Laboratory Data: Urinalysis Component     Latest Ref Rng & Units 01/28/2021  Specific Gravity, UA     1.005 - 1.030 1.010  pH, UA     5.0 - 7.5 5.5  Color, UA     Yellow Yellow  Appearance Ur     Clear Hazy (A)  Leukocytes,UA     Negative Negative  Protein,UA     Negative/Trace Negative  Glucose, UA     Negative Negative  Ketones, UA     Negative Negative  RBC, UA     Negative Trace (A)  Bilirubin, UA     Negative Negative  Urobilinogen, Ur     0.2 - 1.0 mg/dL 0.2  Nitrite, UA     Negative Negative  Microscopic Examination      See below:   Component     Latest Ref Rng & Units 01/28/2021  Specific Gravity, UA     1.005 - 1.030 1.010  pH, UA     5.0 - 7.5 5.5  Color, UA     Yellow Yellow  Appearance Ur     Clear Hazy (A)  Leukocytes,UA     Negative Negative  Protein,UA     Negative/Trace Negative  Glucose, UA     Negative Negative  Ketones, UA     Negative Negative  RBC, UA     Negative Trace (A)  Bilirubin, UA     Negative Negative  Urobilinogen, Ur     0.2 - 1.0 mg/dL 0.2  Nitrite, UA     Negative Negative  Microscopic Examination      See below:  I have reviewed the labs.  Assessment & Plan:   1. Dysuria  -UA with bacteria -Urine sent for culture -Septra DS, BID x 7 days started today and will adjust if necessary once culture and sensitivities are available  Return for pending urine culture results .  These notes generated with voice recognition software. I apologize for typographical errors.  Zara Council, PA-C  [facility] [address], [place], [place] [address] [phone]

## 2021-01-28 ENCOUNTER — Ambulatory Visit: Payer: Medicare Other | Admitting: Urology

## 2021-01-28 ENCOUNTER — Encounter: Payer: Self-pay | Admitting: Urology

## 2021-01-28 ENCOUNTER — Other Ambulatory Visit: Payer: Self-pay

## 2021-01-28 VITALS — BP 166/78 | HR 79 | Ht 67.0 in | Wt 133.0 lb

## 2021-01-28 DIAGNOSIS — N3000 Acute cystitis without hematuria: Secondary | ICD-10-CM | POA: Diagnosis not present

## 2021-01-28 DIAGNOSIS — R319 Hematuria, unspecified: Secondary | ICD-10-CM | POA: Diagnosis not present

## 2021-01-28 LAB — URINALYSIS, COMPLETE
Bilirubin, UA: NEGATIVE
Glucose, UA: NEGATIVE
Ketones, UA: NEGATIVE
Leukocytes,UA: NEGATIVE
Nitrite, UA: NEGATIVE
Protein,UA: NEGATIVE
Specific Gravity, UA: 1.01 (ref 1.005–1.030)
Urobilinogen, Ur: 0.2 mg/dL (ref 0.2–1.0)
pH, UA: 5.5 (ref 5.0–7.5)

## 2021-01-28 LAB — MICROSCOPIC EXAMINATION

## 2021-01-28 MED ORDER — SULFAMETHOXAZOLE-TRIMETHOPRIM 800-160 MG PO TABS: 1.0000 | ORAL_TABLET | Freq: Two times a day (BID) | ORAL | 0 refills | Status: DC

## 2021-01-29 ENCOUNTER — Encounter: Payer: Medicare Other | Admitting: Obstetrics and Gynecology

## 2021-01-31 LAB — CULTURE, URINE COMPREHENSIVE

## 2021-02-12 ENCOUNTER — Encounter: Payer: Medicare Other | Admitting: Obstetrics and Gynecology

## 2021-02-26 NOTE — Progress Notes (Signed)
07/22/2018 8:41 AM   Ilda Foil [name] 16-Dec-1950 [id number]  Referring provider: Jerrol Banana., MD [address] [place],  [place] [address]  Urological history: 1. High risk hematuria -former smoker -CTU 2018 NED -cysto 2018 NED -seen by nephrology-no recommendations -no reports of gross hematuria  -UA negative for micro heme  Chief Complaint  Patient presents with   Follow-up    41mh follow-up    HPI: DVEEHA BACKESis a 70y.o. female who presents today for follow up after a negative culture with symptoms of urgency, frequency, dysuria, genital swelling and two episodes of urge incontinence.  She states that the urgency and frequency and urge incontinence have abated, but she still has vaginal itching and vaginal discharge.  She is also experiencing burning, but she feels it is more vaginal in nature versus urinary.  Patient denies any modifying or aggravating factors.  Patient denies any gross hematuria or suprapubic/flank pain.  Patient denies any fevers, chills, nausea or vomiting.    UA negative   PMH: Past Medical History:  Diagnosis Date   B12 deficiency    Hyperlipidemia     Surgical History: Past Surgical History:  Procedure Laterality Date   ABDOMINAL HYSTERECTOMY     CERVICAL CONE BIOPSY     COLONOSCOPY     DILATION AND CURETTAGE OF UTERUS     TUBAL LIGATION      Home Medications:  Allergies as of 02/27/2021       Reactions   Bee Venom    Cefaclor    Eggs Or Egg-derived Products Nausea And Vomiting   Iodinated Diagnostic Agents    Iohexol     Desc: had severe reaction in october '05   swelling, sob and throat closing   Doxycycline Rash   Latex Rash        Medication List        Accurate as of February 27, 2021 11:59 PM. If you have any questions, ask your nurse or doctor.          STOP taking these medications    sulfamethoxazole-trimethoprim 800-160 MG tablet Commonly known as: BACTRIM DS Stopped by:  SZara Council PA-C       TAKE these medications    B-D 3CC LUER-LOK SYR 25GX5/8" 25G X 5/8" 3 ML Misc Generic drug: SYRINGE-NEEDLE (DISP) 3 ML USE AS DIRECTED   BD Disp Needles 25G X 5/8" Misc Generic drug: NEEDLE (DISP) 25 G USE AS DIRECTED WITH B12 SHOTS   cyanocobalamin 1000 MCG/ML injection Commonly known as: (VITAMIN B-12) Inject 1 mL into skin every 6 days.   EPINEPHrine 0.3 mg/0.3 mL Soaj injection Commonly known as: EPI-PEN INJECT INTRAMUSCULARLY AS DIRECTED   estradiol 0.05 MG/24HR patch Commonly known as: VIVELLE-DOT APPLY 1 PATCH(0.05 MG) TOPICALLY TO THE SKIN 2 TIMES A WEEK   fluconazole 150 MG tablet Commonly known as: DIFLUCAN Take 1 tablet (150 mg total) by mouth every 3 (three) days. For three doses Started by: DJeannie Fend MD   ibuprofen 200 MG tablet Commonly known as: ADVIL Take 200 mg by mouth every 6 (six) hours as needed.   Vitamin D (Ergocalciferol) 1.25 MG (50000 UNIT) Caps capsule Commonly known as: DRISDOL TAKE 1 CAPSULE BY MOUTH EVERY WEEK        Allergies:  Allergies  Allergen Reactions   Bee Venom    Cefaclor    Eggs Or Egg-Derived Products Nausea And Vomiting   Iodinated Diagnostic Agents  Iohexol      Desc: had severe reaction in october '05   swelling, sob and throat closing    Doxycycline Rash   Latex Rash    Family History: Family History  Problem Relation Age of Onset   Diabetes Mother    Heart attack Mother    COPD Father    Pulmonary embolism Father    Hyperlipidemia Sister    Hyperlipidemia Brother    Kidney disease Brother    Kidney Stones Brother    Hyperlipidemia Daughter    Kidney cancer Neg Hx    Prostate cancer Neg Hx    Colon cancer Neg Hx    Colon polyps Neg Hx    Esophageal cancer Neg Hx    Rectal cancer Neg Hx    Stomach cancer Neg Hx     Social History:  reports that she quit smoking about 30 years ago. Her smoking use included cigarettes. She has a 10.00 pack-year smoking history. She  has never used smokeless tobacco. She reports current alcohol use. She reports that she does not use drugs.  ROS: For pertinent review of systems please refer to history of present illness   Physical Exam: BP (!) 157/79   Pulse 76   Ht '5\' 7"'$  (1.702 m)   Wt 131 lb (59.4 kg)   BMI 20.52 kg/m   Constitutional:  Well nourished. Alert and oriented, No acute distress. HEENT: [place] AT, mask in place  Trachea midline Cardiovascular: No clubbing, cyanosis, or edema. Respiratory: Normal respiratory effort, no increased work of breathing. GU: No CVA tenderness.  No bladder fullness or masses.  Atrophic external genitalia, normal pubic hair distribution, no lesions.  Normal urethral meatus, no lesions, no prolapse, no discharge.   No urethral masses, tenderness and/or tenderness. No bladder fullness, tenderness or masses. Pale vagina mucosa, fair estrogen effect, no discharge, no lesions, fair pelvic support, grade I cystocele and no rectocele noted.  Anus and perineum are without rashes or lesions.     Neurologic: Grossly intact, no focal deficits, moving all 4 extremities. Psychiatric: Normal mood and affect.    Laboratory Data: Urinalysis Component     Latest Ref Rng & Units 02/27/2021  Specific Gravity, UA     1.005 - 1.030 <1.005 (L)  pH, UA     5.0 - 7.5 6.0  Color, UA     Yellow Yellow  Appearance Ur     Clear Clear  Leukocytes,UA     Negative Negative  Protein,UA     Negative/Trace Negative  Glucose, UA     Negative Negative  Ketones, UA     Negative Negative  RBC, UA     Negative Negative  Bilirubin, UA     Negative Negative  Urobilinogen, Ur     0.2 - 1.0 mg/dL 0.2  Nitrite, UA     Negative Negative  Microscopic Examination      See below:   Component     Latest Ref Rng & Units 02/27/2021  WBC, UA     0 - 5 /hpf None seen  RBC     0 - 2 /hpf 0-2  Epithelial Cells (non renal)     0 - 10 /hpf 0-10  Bacteria, UA     None seen/Few None seen   I have reviewed the  labs.  Assessment & Plan:   1. Dysuria  -UA  -I did not see evidence of vaginal discharge or yeast infection on today's exam -She does an appointment  with gynecology this afternoon -I explained that if no etiology for the dysuria, vaginal burning or vaginal discharge is found with her gynecological exam that I would recommend undergoing a repeat cystoscopy to rule out bladder CIS as a cause of her symptoms  Return for follow up per gynecological findings .  These notes generated with voice recognition software. I apologize for typographical errors.  Zara Council, PA-C  [facility] [address] [place], [place], [place] [address] [phone]

## 2021-02-27 ENCOUNTER — Ambulatory Visit: Payer: Medicare Other | Admitting: Urology

## 2021-02-27 ENCOUNTER — Encounter: Payer: Self-pay | Admitting: Obstetrics and Gynecology

## 2021-02-27 ENCOUNTER — Ambulatory Visit (INDEPENDENT_AMBULATORY_CARE_PROVIDER_SITE_OTHER): Payer: Medicare Other | Admitting: Obstetrics and Gynecology

## 2021-02-27 ENCOUNTER — Other Ambulatory Visit: Payer: Self-pay

## 2021-02-27 ENCOUNTER — Encounter: Payer: Self-pay | Admitting: Urology

## 2021-02-27 VITALS — BP 157/79 | HR 76 | Ht 67.0 in | Wt 131.0 lb

## 2021-02-27 VITALS — BP 164/78 | HR 72 | Ht 67.0 in | Wt 131.4 lb

## 2021-02-27 DIAGNOSIS — N952 Postmenopausal atrophic vaginitis: Secondary | ICD-10-CM

## 2021-02-27 DIAGNOSIS — B373 Candidiasis of vulva and vagina: Secondary | ICD-10-CM | POA: Diagnosis not present

## 2021-02-27 DIAGNOSIS — B3731 Acute candidiasis of vulva and vagina: Secondary | ICD-10-CM

## 2021-02-27 DIAGNOSIS — N941 Unspecified dyspareunia: Secondary | ICD-10-CM

## 2021-02-27 DIAGNOSIS — N3000 Acute cystitis without hematuria: Secondary | ICD-10-CM | POA: Diagnosis not present

## 2021-02-27 DIAGNOSIS — Z01419 Encounter for gynecological examination (general) (routine) without abnormal findings: Secondary | ICD-10-CM

## 2021-02-27 DIAGNOSIS — Z79890 Hormone replacement therapy (postmenopausal): Secondary | ICD-10-CM

## 2021-02-27 MED ORDER — FLUCONAZOLE 150 MG PO TABS: 150.0000 mg | ORAL_TABLET | ORAL | 0 refills | Status: DC

## 2021-02-27 MED ORDER — ESTRADIOL 0.05 MG/24HR TD PTTW: MEDICATED_PATCH | TRANSDERMAL | 3 refills | Status: DC

## 2021-02-27 NOTE — Progress Notes (Signed)
HPI:      [name] is a 70 y.o. G2P1011 who LMP was No LMP recorded. Patient has had a hysterectomy.  Subjective:   She presents today for her annual examination.  She has been using Vivelle-Dot mainly for vaginal health because she was having some dyspareunia.  She states this is going well and would like to continue. She has a history of hysterectomy for significant pelvic pain from endometriosis. She has been seeing urology for a recent bladder infection with some hematuria.  She was also diagnosed with monilia vulvovaginitis.  She believes she was having some return of the symptoms at this time.    Hx: The following portions of the patient's history were reviewed and updated as appropriate:             She  has a past medical history of B12 deficiency and Hyperlipidemia. She does not have any pertinent problems on file. She  has a past surgical history that includes Abdominal hysterectomy; Tubal ligation; Dilation and curettage of uterus; Colonoscopy; and Cervical cone biopsy. Her family history includes COPD in her father; Diabetes in her mother; Heart attack in her mother; Hyperlipidemia in her brother, daughter, and sister; Kidney Stones in her brother; Kidney disease in her brother; Pulmonary embolism in her father. She  reports that she quit smoking about 30 years ago. Her smoking use included cigarettes. She has a 10.00 pack-year smoking history. She has never used smokeless tobacco. She reports current alcohol use. She reports that she does not use drugs. She has a current medication list which includes the following prescription(s): b-d 3cc luer-lok syr 25gx5/8", bd disp needles, cyanocobalamin, epinephrine, fluconazole, ibuprofen, vitamin d (ergocalciferol), and estradiol, and the following Facility-Administered Medications: sodium chloride. She is allergic to bee venom, cefaclor, eggs or egg-derived products, iodinated diagnostic agents, iohexol, doxycycline, and latex.        Review of Systems:  Review of Systems  Constitutional: Denied constitutional symptoms, night sweats, recent illness, fatigue, fever, insomnia and weight loss.  Eyes: Denied eye symptoms, eye pain, photophobia, vision change and visual disturbance.  Ears/Nose/Throat/Neck: Denied ear, nose, throat or neck symptoms, hearing loss, nasal discharge, sinus congestion and sore throat.  Cardiovascular: Denied cardiovascular symptoms, arrhythmia, chest pain/pressure, edema, exercise intolerance, orthopnea and palpitations.  Respiratory: Denied pulmonary symptoms, asthma, pleuritic pain, productive sputum, cough, dyspnea and wheezing.  Gastrointestinal: Denied, gastro-esophageal reflux, melena, nausea and vomiting.  Genitourinary: See HPI for additional information.  Musculoskeletal: Denied musculoskeletal symptoms, stiffness, swelling, muscle weakness and myalgia.  Dermatologic: Denied dermatology symptoms, rash and scar.  Neurologic: Denied neurology symptoms, dizziness, headache, neck pain and syncope.  Psychiatric: Denied psychiatric symptoms, anxiety and depression.  Endocrine: Denied endocrine symptoms including hot flashes and night sweats.   Meds:   Current Outpatient Medications on File Prior to Visit  Medication Sig Dispense Refill   B-D 3CC LUER-LOK SYR 25GX5/8" 25G X 5/8" 3 ML MISC USE AS DIRECTED 50 each 12   BD DISP NEEDLES 25G X 5/8" MISC USE AS DIRECTED WITH B12 SHOTS     cyanocobalamin (,VITAMIN B-12,) 1000 MCG/ML injection Inject 1 mL into skin every 6 days. 10 mL 5   EPINEPHrine 0.3 mg/0.3 mL IJ SOAJ injection INJECT INTRAMUSCULARLY AS DIRECTED 2 each 3   ibuprofen (ADVIL,MOTRIN) 200 MG tablet Take 200 mg by mouth every 6 (six) hours as needed.     Vitamin D, Ergocalciferol, (DRISDOL) 1.25 MG (50000 UNIT) CAPS capsule TAKE 1 CAPSULE BY MOUTH EVERY WEEK  4 capsule 12   Current Facility-Administered Medications on File Prior to Visit  Medication Dose Route Frequency Provider  Last Rate Last Admin   0.9 %  sodium chloride infusion  500 mL Intravenous Once Irene Shipper, MD           Objective:     Vitals:   02/27/21 1240  BP: (!) 164/78  Pulse: 72    Filed Weights   02/27/21 1240  Weight: 131 lb 6.4 oz (59.6 kg)              Physical examination General NAD, Conversant  HEENT Atraumatic; Op clear with mmm.  Normo-cephalic. Pupils reactive. Anicteric sclerae  Thyroid/Neck Smooth without nodularity or enlargement. Normal ROM.  Neck Supple.  Skin No rashes, lesions or ulceration. Normal palpated skin turgor. No nodularity.  Breasts: No masses or discharge.  Symmetric.  No axillary adenopathy.  Lungs: Clear to auscultation.No rales or wheezes. Normal Respiratory effort, no retractions.  Heart: NSR.  No murmurs or rubs appreciated. No periferal edema  Abdomen: Soft.  Non-tender.  No masses.  No HSM. No hernia  Extremities: Moves all appropriately.  Normal ROM for age. No lymphadenopathy.  Neuro: Oriented to PPT.  Normal mood. Normal affect.     Pelvic:   Vulva: Normal appearance.  No lesions.  Vagina: No lesions or abnormalities noted.    Support: Normal pelvic support.  Urethra No masses tenderness or scarring.  Meatus Normal size without lesions or prolapse.  Cervix: Surgically absent  Anus: Normal exam.  No lesions.  Perineum: Normal exam.  No lesions.        Bimanual   Uterus: Surgically absent  Adnexae: No masses.  Non-tender to palpation.  Cul-de-sac: Negative for abnormality.     Assessment:    G2P1011 Patient Active Problem List   Diagnosis Date Noted   Postmenopausal 01/21/2019   Cough 08/21/2016   Endometriosis 04/25/2015   Personal history of reproductive and obstetrical problems 04/25/2015   HLD (hyperlipidemia) 04/25/2015   B12 deficiency 04/25/2015     1. Well woman exam with routine gynecological exam   2. Vaginal atrophy   3. Dyspareunia in female   4. Monilial vulvovaginitis   5. Postmenopausal hormone therapy      Patient doing well with Vivelle-Dot.  Recently diagnosed with monilia and still has some symptoms.   Plan:            1.  Basic Screening Recommendations The basic screening recommendations for asymptomatic women were discussed with the patient during her visit.  The age-appropriate recommendations were discussed with her and the rational for the tests reviewed.  When I am informed by the patient that another primary care physician has previously obtained the age-appropriate tests and they are up-to-date, only outstanding tests are ordered and referrals given as necessary.  Abnormal results of tests will be discussed with her when all of her results are completed.  Routine preventative health maintenance measures emphasized: Exercise/Diet/Weight control, Tobacco Warnings, Alcohol/Substance use risks and Stress Management 2.  Continue Vivelle-Dot 3.  Diflucan for possible monilia  Orders No orders of the defined types were placed in this encounter.    Meds ordered this encounter  Medications   estradiol (VIVELLE-DOT) 0.05 MG/24HR patch    Sig: APPLY 1 PATCH(0.05 MG) TOPICALLY TO THE SKIN 2 TIMES A WEEK    Dispense:  24 patch    Refill:  3    ZERO refills remain on this prescription. Your patient is requesting  advance approval of refills for this medication to PREVENT ANY MISSED DOSES   fluconazole (DIFLUCAN) 150 MG tablet    Sig: Take 1 tablet (150 mg total) by mouth every 3 (three) days. For three doses    Dispense:  3 tablet    Refill:  0           F/U  Return in about 1 year (around 02/27/2022) for Annual Physical.  Finis Bud, M.D. 02/27/2021 1:08 PM

## 2021-02-28 LAB — URINALYSIS, COMPLETE
Bilirubin, UA: NEGATIVE
Glucose, UA: NEGATIVE
Ketones, UA: NEGATIVE
Leukocytes,UA: NEGATIVE
Nitrite, UA: NEGATIVE
Protein,UA: NEGATIVE
RBC, UA: NEGATIVE
Specific Gravity, UA: <1.005 — ABNORMAL LOW (ref 1.005–1.030)
Urobilinogen, Ur: 0.2 mg/dL (ref 0.2–1.0)
pH, UA: 6 (ref 5.0–7.5)

## 2021-02-28 LAB — MICROSCOPIC EXAMINATION
Bacteria, UA: NONE SEEN
WBC, UA: NONE SEEN /hpf (ref 0–5)

## 2021-03-01 ENCOUNTER — Encounter: Payer: Self-pay | Admitting: Family Medicine

## 2021-03-14 ENCOUNTER — Other Ambulatory Visit: Payer: Self-pay

## 2021-03-14 ENCOUNTER — Ambulatory Visit
Admission: RE | Admit: 2021-03-14 | Discharge: 2021-03-14 | Disposition: A | Discharge status: Home / Self Care | Payer: Medicare Other | Source: Ambulatory Visit | Attending: Obstetrics and Gynecology | Admitting: Obstetrics and Gynecology

## 2021-03-14 DIAGNOSIS — Z1231 Encounter for screening mammogram for malignant neoplasm of breast: Secondary | ICD-10-CM | POA: Diagnosis not present

## 2021-03-24 NOTE — Progress Notes (Signed)
07/22/2018 7:20 AM   Ilda Foil [address] [address] [birth year] [id number]  Referring provider: Jerrol Banana., MD [address] [place],  [place] [address]  Urological history: 1. High risk hematuria -former smoker -CTU 2018 NED -cysto 2018 NED -seen by nephrology-no recommendations -no reports of gross hematuria  Chief Complaint  Patient presents with   Other     HPI: Tracey Wolf is a 70 y.o. female who presents today to discuss cystoscopy for her persistent dysuria.  She continues to experiencing intense urgency, dysuria and a feeling of pressure that is like a band around her suprapubic area.  She has not had relief with antibiotics or antifungals.  Patient denies any modifying or aggravating factors.  Patient denies any gross hematuria or flank pain.  Patient denies any fevers, chills, nausea or vomiting.    KUB no stones seen   PMH: Past Medical History:  Diagnosis Date   B12 deficiency    Hyperlipidemia     Surgical History: Past Surgical History:  Procedure Laterality Date   ABDOMINAL HYSTERECTOMY     CERVICAL CONE BIOPSY     COLONOSCOPY     DILATION AND CURETTAGE OF UTERUS     TUBAL LIGATION      Home Medications:  Allergies as of 03/25/2021       Reactions   Bee Venom    Cefaclor    Eggs Or Egg-derived Products Nausea And Vomiting   Iodinated Diagnostic Agents    Iohexol     Desc: had severe reaction in october '05   swelling, sob and throat closing   Doxycycline Rash   Latex Rash        Medication List        Accurate as of March 25, 2021 11:59 PM. If you have any questions, ask your nurse or doctor.          B-D 3CC LUER-LOK SYR 25GX5/8" 25G X 5/8" 3 ML Misc Generic drug: SYRINGE-NEEDLE (DISP) 3 ML USE AS DIRECTED   BD Disp Needles 25G X 5/8" Misc Generic drug: NEEDLE (DISP) 25 G USE AS DIRECTED WITH B12 SHOTS   cyanocobalamin 1000 MCG/ML injection Commonly known as: (VITAMIN B-12) Inject 1 mL into skin  every 6 days.   EPINEPHrine 0.3 mg/0.3 mL Soaj injection Commonly known as: EPI-PEN INJECT INTRAMUSCULARLY AS DIRECTED   estradiol 0.05 MG/24HR patch Commonly known as: VIVELLE-DOT APPLY 1 PATCH(0.05 MG) TOPICALLY TO THE SKIN 2 TIMES A WEEK   fluconazole 150 MG tablet Commonly known as: DIFLUCAN Take 1 tablet (150 mg total) by mouth every 3 (three) days. For three doses   ibuprofen 200 MG tablet Commonly known as: ADVIL Take 200 mg by mouth every 6 (six) hours as needed.   Vitamin D (Ergocalciferol) 1.25 MG (50000 UNIT) Caps capsule Commonly known as: DRISDOL TAKE 1 CAPSULE BY MOUTH EVERY WEEK        Allergies:  Allergies  Allergen Reactions   Bee Venom    Cefaclor    Eggs Or Egg-Derived Products Nausea And Vomiting   Iodinated Diagnostic Agents    Iohexol      Desc: had severe reaction in october '05   swelling, sob and throat closing    Doxycycline Rash   Latex Rash    Family History: Family History  Problem Relation Age of Onset   Diabetes Mother    Heart attack Mother    COPD Father    Pulmonary embolism Father    Hyperlipidemia  Sister    Hyperlipidemia Daughter    Hyperlipidemia Brother    Kidney disease Brother    Kidney Stones Brother    Kidney cancer Neg Hx    Prostate cancer Neg Hx    Colon cancer Neg Hx    Colon polyps Neg Hx    Esophageal cancer Neg Hx    Rectal cancer Neg Hx    Stomach cancer Neg Hx    Breast cancer Neg Hx     Social History:  reports that she quit smoking about 30 years ago. Her smoking use included cigarettes. She has a 10.00 pack-year smoking history. She has never used smokeless tobacco. She reports current alcohol use. She reports that she does not use drugs.  ROS: For pertinent review of systems please refer to history of present illness   Physical Exam: BP 119/69   Pulse 75   Ht '5\' 7"'$  (1.702 m)   Wt 131 lb (59.4 kg)   BMI 20.52 kg/m   Constitutional:  Well nourished. Alert and oriented, No acute  distress. HEENT: [place] AT, mask in place.  Trachea midline Cardiovascular: No clubbing, cyanosis, or edema. Respiratory: Normal respiratory effort, no increased work of breathing. Neurologic: Grossly intact, no focal deficits, moving all 4 extremities. Psychiatric: Normal mood and affect.    Laboratory Data: N/A  Pertinent imaging CLINICAL DATA:  Acute flank pain.   EXAM: ABDOMEN - 1 VIEW   COMPARISON:  CT abdomen pelvis dated June 14, 2017.   FINDINGS: The bowel gas pattern is normal. No radio-opaque calculi or other significant radiographic abnormality are seen. Unchanged phleboliths in the pelvis. No acute osseous abnormality.   IMPRESSION: Negative.     Electronically Signed   By: Titus Dubin M.D.   On: 03/26/2021 10:56 I have independently reviewed the films.  See HPI.    Assessment & Plan:   1. Dysuria  -We will pursue cystoscopy at this time -I have explained to the patient that they will  be scheduled for a cystoscopy in our office to evaluate their bladder.  The cystoscopy consists of passing a tube with a lens up through their urethra and into their urinary bladder.   We will inject the urethra with a lidocaine gel prior to introducing the cystoscope to help with any discomfort during the procedure.   After the procedure, they might experience blood in the urine and discomfort with urination.  This will abate after the first few voids.  I have  encouraged the patient to increase water intake  during this time.  Patient denies any allergies to lidocaine.    2. Suprapubic pain/LBP -KUB no stones seen  Return for cysto for persistent dysuria/suprapubic pressure .  These notes generated with voice recognition software. I apologize for typographical errors.  Zara Council, PA-C  [facility] [address], [place], [place] [address] [phone]

## 2021-03-25 ENCOUNTER — Ambulatory Visit
Admission: RE | Admit: 2021-03-25 | Discharge: 2021-03-25 | Disposition: A | Discharge status: Home / Self Care | Payer: Medicare Other | Attending: Urology | Admitting: Urology

## 2021-03-25 ENCOUNTER — Ambulatory Visit
Admission: RE | Admit: 2021-03-25 | Discharge: 2021-03-25 | Disposition: A | Discharge status: Home / Self Care | Payer: Medicare Other | Source: Ambulatory Visit | Attending: Urology | Admitting: Urology

## 2021-03-25 ENCOUNTER — Other Ambulatory Visit: Payer: Self-pay

## 2021-03-25 ENCOUNTER — Ambulatory Visit: Payer: Medicare Other | Admitting: Urology

## 2021-03-25 VITALS — BP 119/69 | HR 75 | Ht 67.0 in | Wt 131.0 lb

## 2021-03-25 DIAGNOSIS — R109 Unspecified abdominal pain: Secondary | ICD-10-CM | POA: Diagnosis not present

## 2021-03-25 DIAGNOSIS — M545 Low back pain, unspecified: Secondary | ICD-10-CM | POA: Diagnosis not present

## 2021-03-25 DIAGNOSIS — R300 Dysuria: Secondary | ICD-10-CM | POA: Diagnosis not present

## 2021-03-27 ENCOUNTER — Encounter: Payer: Self-pay | Admitting: Urology

## 2021-04-09 NOTE — H&P (View-Only) (Signed)
   04/10/21  CC:  Chief Complaint  Patient presents with   Cysto     HPI: Tracey Wolf is a 70 y.o.female with a personal history of high risk hematuria, who presents today for a cystoscopy for urgency/dysuria.   Recent urinalysis on 02/27/2021 was negative.   KUB on 03/25/2021 for acute flank pain was negative.   In the interim seeing Dr. Amalia Hailey.  She was started on Vivelle-Dot for her vaginal symptoms which may be related.  She was diagnosed with monilia vulvovaginitis and vaginal atrophy .   She is doing well today. She reports she has been having burning with urination. She states she has had and accident 2-3 times.  She is bothered by frequency urgency.    Vitals:   04/10/21 0955  BP: (!) 155/73  Pulse: 80   NED. A&Ox3.   No respiratory distress   Abd soft, NT, ND Normal external genitalia with patent urethral meatus  Cystoscopy Procedure Note  Patient identification was confirmed, informed consent was obtained, and patient was prepped using Betadine solution.  Lidocaine jelly was administered per urethral meatus.    Procedure: - Flexible cystoscope introduced, without any difficulty.   - Thorough search of the bladder revealed:    normal urethral meatus    normal urothelium    no stones    no ulcers     no tumors    no urethral polyps    no trabeculation -Whitish heaped up area, approximately 1 cm in which there is an embedded what looks like calcification or thrombosed vessel in the central portion of the trigone  - Ureteral orifices were normal in position and appearance.  Post-Procedure: - Patient tolerated the procedure well  Assessment/ Plan  Bladder lesion - Cystoscopy today showed 1 cm area in the middle of the trigone which is likely hypertrophied squamous metaplasia however underlying malignancy cannot be ruled out.  Recommend bladder biopsy to further evaluate.   - risk of biopsy include leeding, infection, damage surrounding structures,  injury to the bladder/ urethral, bladder neck contracture, ureteral stricture, stress/ urge incontinence, exacerbation of irritative voiding symptoms were all discussed in detail.  - preop urine culture today and cytology   2. . Urgency/frequency   - Gemtesa 75 mg samples x1 month to help with her symptoms     I,Kailey Littlejohn,acting as a scribe for Hollice Espy, MD.,have documented all relevant documentation on the behalf of Hollice Espy, MD,as directed by  Hollice Espy, MD while in the presence of Hollice Espy, MD.  I have reviewed the above documentation for accuracy and completeness, and I agree with the above.   Hollice Espy, MD

## 2021-04-09 NOTE — Progress Notes (Signed)
   04/10/21  CC:  Chief Complaint  Patient presents with   Cysto     HPI: Tracey Wolf is a 70 y.o.female with a personal history of high risk hematuria, who presents today for a cystoscopy for urgency/dysuria.   Recent urinalysis on 02/27/2021 was negative.   KUB on 03/25/2021 for acute flank pain was negative.   In the interim seeing Dr. Amalia Hailey.  She was started on Vivelle-Dot for her vaginal symptoms which may be related.  She was diagnosed with monilia vulvovaginitis and vaginal atrophy .   She is doing well today. She reports she has been having burning with urination. She states she has had and accident 2-3 times.  She is bothered by frequency urgency.    Vitals:   04/10/21 0955  BP: (!) 155/73  Pulse: 80   NED. A&Ox3.   No respiratory distress   Abd soft, NT, ND Normal external genitalia with patent urethral meatus  Cystoscopy Procedure Note  Patient identification was confirmed, informed consent was obtained, and patient was prepped using Betadine solution.  Lidocaine jelly was administered per urethral meatus.    Procedure: - Flexible cystoscope introduced, without any difficulty.   - Thorough search of the bladder revealed:    normal urethral meatus    normal urothelium    no stones    no ulcers     no tumors    no urethral polyps    no trabeculation -Whitish heaped up area, approximately 1 cm in which there is an embedded what looks like calcification or thrombosed vessel in the central portion of the trigone  - Ureteral orifices were normal in position and appearance.  Post-Procedure: - Patient tolerated the procedure well  Assessment/ Plan  Bladder lesion - Cystoscopy today showed 1 cm area in the middle of the trigone which is likely hypertrophied squamous metaplasia however underlying malignancy cannot be ruled out.  Recommend bladder biopsy to further evaluate.   - risk of biopsy include leeding, infection, damage surrounding structures,  injury to the bladder/ urethral, bladder neck contracture, ureteral stricture, stress/ urge incontinence, exacerbation of irritative voiding symptoms were all discussed in detail.  - preop urine culture today and cytology   2. . Urgency/frequency   - Gemtesa 75 mg samples x1 month to help with her symptoms     I,Kailey Littlejohn,acting as a scribe for Hollice Espy, MD.,have documented all relevant documentation on the behalf of Hollice Espy, MD,as directed by  Hollice Espy, MD while in the presence of Hollice Espy, MD.  I have reviewed the above documentation for accuracy and completeness, and I agree with the above.   Hollice Espy, MD

## 2021-04-10 ENCOUNTER — Encounter: Payer: Self-pay | Admitting: Urology

## 2021-04-10 ENCOUNTER — Ambulatory Visit: Payer: Medicare Other | Admitting: Urology

## 2021-04-10 ENCOUNTER — Other Ambulatory Visit: Payer: Self-pay

## 2021-04-10 VITALS — BP 155/73 | HR 80 | Ht 67.0 in | Wt 131.0 lb

## 2021-04-10 DIAGNOSIS — R300 Dysuria: Secondary | ICD-10-CM | POA: Diagnosis not present

## 2021-04-10 MED ORDER — GEMTESA 75 MG PO TABS: 75.0000 mg | ORAL_TABLET | Freq: Every day | ORAL | 0 refills | Status: DC

## 2021-04-10 NOTE — Patient Instructions (Signed)
Bladder Biopsy ?A bladder biopsy is a procedure to remove a small sample of tissue from the bladder. The procedure is done so that the tissue can be examined under a microscope. You may have a bladder biopsy to diagnose or rule out cancer of the bladder. ?During a bladder biopsy, your health care provider may insert a long, thin scope with a lighted camera (cystoscope) into the urethra and move it into your bladder. The cystoscope will allow your health care provider to check the lining of the urethra and bladder and remove the tissue sample. If your health care provider also needs to check your ureters, a longer tube (ureteroscope) may be used. ?Tell your health care provider about: ?Any allergies you have. ?All medicines you are taking, including vitamins, herbs, eye drops, creams, and over-the-counter medicines. ?Any problems you or family members have had with anesthetic medicines. ?Any blood disorders you have. ?Any surgeries you have had. ?Any medical conditions you have. ?Whether you are pregnant or may be pregnant. ?What are the risks? ?Generally, this is a safe procedure. However, problems may occur, including: ?Bleeding. ?Infection, especially a urinary tract infection (UTI). ?Allergic reactions to medicines. ?Damage to nearby structures or organs, including the urethra, bladder, or ureters. ?Abdominal pain. ?Burning or pain during urination. ?Narrowing of the urethra due to scar tissue. ?Difficulty urinating due to swelling. ?What happens before the procedure? ?Medicines ?Ask your health care provider about: ?Changing or stopping your regular medicines. This is especially important if you are taking diabetes medicines or blood thinners. ?Taking medicines such as aspirin and ibuprofen. These medicines can thin your blood. Do not take these medicines unless your health care provider tells you to take them. ?Taking over-the-counter medicines, vitamins, herbs, and supplements. ?Surgery safety ?Ask your health  care provider: ?How your surgery site will be marked. ?What steps will be taken to help prevent infection. These may include: ?Removing hair at the surgery site. ?Washing skin with a germ-killing soap. ?Receiving antibiotic medicine. ?General instructions ?Follow instructions from your health care provider about eating and drinking restrictions. ?You may be asked to drink plenty of fluids. ?You may be asked to urinate right before the procedure. You may have a urine sample taken for UTI testing. ?Plan to have someone take you home from the hospital or clinic. ?If you will be going home right after the procedure, plan to have someone with you for 24 hours. ?What happens during the procedure? ? ?An IV may be inserted into one of your veins. ?You may be given one or more of the following: ?A medicine to help you relax (sedative). ?A medicine to numb the opening of the urethra (local anesthetic). ?A medicine to make you fall asleep (general anesthetic). ?You will lie on your back with your knees bent and spread apart. ?The cystoscope or ureteroscope will be inserted into your urethra and guided into your bladder or ureters. ?Your bladder may be slowly filled with germ-free (sterile) water. This will make it easier for your health care provider to view the wall or lining of your bladder. ?Small instruments will be inserted through the scope to collect a small tissue sample that will be examined under a microscope. ?The procedure may vary among health care providers and hospitals. ?What happens after the procedure? ?Your blood pressure, heart rate, breathing rate, and blood oxygen level will be monitored until you leave the hospital or clinic. ?You may be asked to empty your bladder, or your bladder may be emptied for you. ?Do   not drive for 24 hours if you received a sedative. ?Summary ?A bladder biopsy is a procedure to remove a small sample of tissue from the bladder. ?You may have a bladder biopsy to diagnose or rule  out cancer of the bladder. ?Follow instructions from your health care provider about eating and drinking restrictions. ?Ask your health care provider if you need to stop or change any medicines that you are taking. ?If you will be going home right after the procedure, plan to have someone with you for 24 hours. ?This information is not intended to replace advice given to you by your health care provider. Make sure you discuss any questions you have with your health care provider. ?Document Revised: 01/25/2019 Document Reviewed: 01/25/2019 ?Elsevier Patient Education ? 2022 [employer] ? ?

## 2021-04-11 LAB — URINALYSIS, COMPLETE
Bilirubin, UA: NEGATIVE
Glucose, UA: NEGATIVE
Ketones, UA: NEGATIVE
Leukocytes,UA: NEGATIVE
Nitrite, UA: NEGATIVE
Protein,UA: NEGATIVE
RBC, UA: NEGATIVE
Specific Gravity, UA: <1.005 — ABNORMAL LOW (ref 1.005–1.030)
Urobilinogen, Ur: 0.2 mg/dL (ref 0.2–1.0)
pH, UA: 6.5 (ref 5.0–7.5)

## 2021-04-11 LAB — MICROSCOPIC EXAMINATION: RBC, Urine: NONE SEEN /hpf (ref 0–2)

## 2021-04-11 LAB — CYTOLOGY - NON PAP

## 2021-04-14 ENCOUNTER — Telehealth: Payer: Self-pay | Admitting: Urology

## 2021-04-14 ENCOUNTER — Other Ambulatory Visit: Payer: Self-pay

## 2021-04-14 DIAGNOSIS — N329 Bladder disorder, unspecified: Secondary | ICD-10-CM

## 2021-04-14 NOTE — Telephone Encounter (Signed)
[facility] Posting Form   Surgery Date/Time: Date: 05/05/2021  Surgeon: Hollice Espy, MD  Surgery Location: Day Surgery  Inpt ( NO  )   Outpt (Yes)   Obs ( No  )   Diagnosis: N32.9 Bladder Lesion  -CPT: 52204   Surgery: Cysto Bladder Biopsy  -CPT: [id number]  Surgery: Bilateral Retrograde Pyelogram   Stop Anticoagulations: No Patient is not currently taking   *Orders entered into EPIC  Date: '@TODAY'$ @   *Case booked in EPIC  Date: '@TODAY'$ @  *Notified pt of Surgery: Date: '@TODAY'$ @   *Placed into Prior Authorization Work Que Date: '@TODAY'$ @   Assistant/laser/rep:No

## 2021-04-14 NOTE — Telephone Encounter (Signed)
Surgical Physician Order Form  *Length of Case:   *Medical Clearance if needed:    PCP     _0       Cardiology    _1        Pulmonology _2   *MD to give Clearance: n/a  *Anticoagulation: Hold all anticoagulation [No]  May continue aspirin [          May continue all anticoagulants [  *Post-op visit Date/Instructions: TBD         KUB prior _3     RUS prior   _4     Voiding trial  _5     Cysto stent removal  _6    Follow up with: TBD   *Diagnosis: Bladder Lesion  *Procedure: Bladder Biopsy, Bilateral Retrogrades  Admit type: Outpatient  Yes  Observation No   Inpatient No    Number of Days: ___   Anesthesia: general   Labs:  Per Anesthesia  [YES]    UA&Urine Culture [No]  CBC  _7    B MET   _8    PT/INR   _9   aPTT  _10            Urine Pregnancy _11    Urine Drug Screen _12     Type & Screen  _13   Crossmatch: ____ units  Other: ________   Tests:  EKG/Chest x-ray per Anesthesia [YES]         Chest X-ray  _14   EKG  _15   KUB day of procedure  _16    Medications:   Ancef 1g IV  _17       Ancef 2g IV  [YES]  Ciprofloxacin 427m IV  _18   Vancomycin 1g IV  _19   Ceftriaxone (Rocephin) 1g IV  _20     Clindamycin 6076mIV  _21   Clindamycin 90010mV  _22   Ampicillin 1g IV  _23   Keflex 500m62m  _24   Gentamicin __ mg IV or per pharmacy _25   Gemcitabine 2000mg108mdder instillation _26      Diflucan 100mg 62m_27    Botox injection _28  units ____  Magnesium citrate 1 bottle PO - when?___     Fleets enema - when?__  Other:__________     VTE Prophylaxis:     SCD's  [YES]      TED Hose  _29     Heparin 5000 units sq  _30         Other: __________

## 2021-04-14 NOTE — Progress Notes (Signed)
[facility] Posting Form    Surgery Date/Time: Date: 05/05/2021   Surgeon: Hollice Espy, MD   Surgery Location: Day Surgery   Inpt ( NO  )   Outpt (Yes)   Obs ( No  )    Diagnosis: N32.9 Bladder Lesion   -CPT: 52204    Surgery: Cysto Bladder Biopsy   -CPT: [phone]   Surgery: Bilateral Retrograde Pyelogram    Stop Anticoagulations: No Patient is not currently taking     *Orders entered into EPIC  Date: '@TODAY'$ @    *Case booked in EPIC  Date: '@TODAY'$ @   *Notified pt of Surgery: Date: '@TODAY'$ @     *Placed into Prior Authorization Work Que Date: '@TODAY'$ @     Assistant/laser/rep:No

## 2021-04-14 NOTE — Telephone Encounter (Signed)
Per Dr. Erlene Quan Patient is to be scheduled for BLADDER BIOPSY, BILATERAL RETROGRADE PYELOGRAMS  MRS. Theall was contacted and possible surgical dates were discussed, 05/05/21 was agreed upon for surgery.     Patient was directed to call [phone] between 1-3pm the day before surgery to find out surgical arrival time.  Instructions were given not to eat or drink from midnight on the night before surgery and have a driver for the day of surgery. On the surgery day patient was instructed to enter through the [place] entrance of [facility] report the Same Day Surgery desk.   Pre-Admit Testing will be in contact via phone to set up an interview with the anesthesia team to review your history and medications prior to surgery.   Reminder of this information was sent via [facility] to the patient.   Patient is not to hold anticoag's per Dr. Erlene Quan.

## 2021-04-18 LAB — CULTURE, URINE COMPREHENSIVE

## 2021-04-28 ENCOUNTER — Other Ambulatory Visit: Payer: Self-pay

## 2021-04-28 ENCOUNTER — Encounter
Admission: RE | Admit: 2021-04-28 | Discharge: 2021-04-28 | Disposition: A | Discharge status: Home / Self Care | Payer: Medicare Other | Source: Ambulatory Visit | Attending: Urology | Admitting: Urology

## 2021-04-28 HISTORY — DX: Asymptomatic menopausal state: Z78.0

## 2021-04-28 NOTE — Patient Instructions (Addendum)
Your procedure is scheduled [id number] 05/05/21 Report to the Registration Desk on the 1st floor of the [facility]. To find out your arrival time, please call [phone] between 1PM - 3PM [id number] 05/02/21  REMEMBER: Instructions that are not followed completely may result in serious medical risk, up to and including death; or upon the discretion of your surgeon and anesthesiologist your surgery may need to be rescheduled.  Do not eat food/liquid after midnight the night before surgery.  No gum chewing, lozengers or hard candies.  One week prior to surgery: Stop Anti-inflammatories (NSAIDS) such as Advil, Aleve, Ibuprofen, Motrin, Naproxen, Naprosyn and Aspirin based products such as Excedrin, Goodys Powder, BC Powder. Stop ANY OVER THE COUNTER supplements until after surgery  Stop on 04/27/21. Vitamin D, Ergocalciferol, (DRISDOL) 1.25 MG (50000 UNIT) CAPS capsule You may however, continue to take Tylenol if needed for pain up until the day of surgery.  No Alcohol for 24 hours before or after surgery.  No Smoking including e-cigarettes for 24 hours prior to surgery.  No chewable tobacco products for at least 6 hours prior to surgery.  No nicotine patches on the day of surgery.  Do not use any "recreational" drugs for at least a week prior to your surgery.  Please be advised that the combination of cocaine and anesthesia may have negative outcomes, up to and including death. If you test positive for cocaine, your surgery will be cancelled.  On the morning of surgery brush your teeth with toothpaste and water, you may rinse your mouth with mouthwash if you wish. Do not swallow any toothpaste or mouthwash.  Do not wear jewelry, make-up, hairpins, clips or nail polish.  Do not wear lotions, powders, or perfumes.   Do not shave body from the neck down 48 hours prior to surgery just in case you cut yourself which could leave a site for infection.  Also, freshly shaved skin may become  irritated if using the CHG soap.  Contact lenses, hearing aids and dentures may not be worn into surgery.  Do not bring valuables to the hospital. [facility] is not responsible for any missing/lost belongings or valuables.   Notify your doctor if there is any change in your medical condition (cold, fever, infection).  Wear comfortable clothing (specific to your surgery type) to the hospital.  If you are being discharged the day of surgery, you will not be allowed to drive home. You will need a responsible adult (18 years or older) to drive you home and stay with you that night.   If you are taking public transportation, you will need to have a responsible adult (18 years or older) with you. Please confirm with your physician that it is acceptable to use public transportation.   Please call the [facility] Dept. at [phone] if you have any questions about these instructions.  Surgery Visitation Policy:  Patients undergoing a surgery or procedure may have one family member or support person with them as long as that person is not COVID-19 positive or experiencing its symptoms.  That person may remain in the waiting area during the procedure and may rotate out with other people.  Inpatient Visitation:    Visiting hours are 7 a.m. to 8 p.m. Up to two visitors ages 16+ are allowed at one time in a patient room. The visitors may rotate out with other people during the day. Visitors must check out when they leave, or other visitors will not be allowed. One designated  support person may remain overnight. The visitor must pass COVID-19 screenings, use hand sanitizer when entering and exiting the patient's room and wear a mask at all times, including in the patient's room. Patients must also wear a mask when staff or their visitor are in the room. Masking is required regardless of vaccination status.

## 2021-04-29 ENCOUNTER — Encounter
Admission: RE | Admit: 2021-04-29 | Discharge: 2021-04-29 | Disposition: A | Discharge status: Home / Self Care | Payer: Medicare Other | Source: Ambulatory Visit | Attending: Urology | Admitting: Urology

## 2021-04-29 DIAGNOSIS — Z01810 Encounter for preprocedural cardiovascular examination: Secondary | ICD-10-CM | POA: Insufficient documentation

## 2021-05-05 ENCOUNTER — Ambulatory Visit
Admission: RE | Admit: 2021-05-05 | Discharge: 2021-05-05 | Disposition: A | Discharge status: Home / Self Care | Payer: Medicare Other | Source: Ambulatory Visit | Attending: Urology | Admitting: Urology

## 2021-05-05 ENCOUNTER — Encounter: Payer: Self-pay | Admitting: Urology

## 2021-05-05 ENCOUNTER — Encounter
Admission: RE | Disposition: A | Discharge status: Home / Self Care | Payer: Self-pay | Source: Ambulatory Visit | Attending: Urology

## 2021-05-05 ENCOUNTER — Ambulatory Visit: Payer: Medicare Other | Admitting: Anesthesiology

## 2021-05-05 ENCOUNTER — Ambulatory Visit: Payer: Medicare Other

## 2021-05-05 ENCOUNTER — Other Ambulatory Visit: Payer: Self-pay

## 2021-05-05 DIAGNOSIS — N3081 Other cystitis with hematuria: Secondary | ICD-10-CM | POA: Insufficient documentation

## 2021-05-05 DIAGNOSIS — R3915 Urgency of urination: Secondary | ICD-10-CM

## 2021-05-05 DIAGNOSIS — Z87891 Personal history of nicotine dependence: Secondary | ICD-10-CM | POA: Diagnosis not present

## 2021-05-05 DIAGNOSIS — N3080 Other cystitis without hematuria: Secondary | ICD-10-CM

## 2021-05-05 DIAGNOSIS — E785 Hyperlipidemia, unspecified: Secondary | ICD-10-CM | POA: Diagnosis not present

## 2021-05-05 DIAGNOSIS — R300 Dysuria: Secondary | ICD-10-CM

## 2021-05-05 DIAGNOSIS — R350 Frequency of micturition: Secondary | ICD-10-CM

## 2021-05-05 DIAGNOSIS — B3731 Acute candidiasis of vulva and vagina: Secondary | ICD-10-CM | POA: Insufficient documentation

## 2021-05-05 DIAGNOSIS — N329 Bladder disorder, unspecified: Secondary | ICD-10-CM

## 2021-05-05 HISTORY — PX: CYSTOSCOPY WITH BIOPSY: SHX5122

## 2021-05-05 SURGERY — CYSTOSCOPY, WITH BIOPSY
Anesthesia: General

## 2021-05-05 MED ORDER — DEXAMETHASONE SODIUM PHOSPHATE 10 MG/ML IJ SOLN
INTRAMUSCULAR | Status: DC | PRN
  Administered 2021-05-05: 10 mg via INTRAVENOUS

## 2021-05-05 MED ORDER — LACTATED RINGERS IV SOLN: INTRAVENOUS | Status: DC

## 2021-05-05 MED ORDER — FAMOTIDINE 20 MG PO TABS: 20.0000 mg | ORAL_TABLET | Freq: Once | ORAL | Status: AC

## 2021-05-05 MED ORDER — ACETAMINOPHEN 10 MG/ML IV SOLN
INTRAVENOUS | Status: AC
  Filled 2021-05-05: qty 100

## 2021-05-05 MED ORDER — PROPOFOL 10 MG/ML IV BOLUS
INTRAVENOUS | Status: DC | PRN
  Administered 2021-05-05: 30 mg via INTRAVENOUS
  Administered 2021-05-05: 20 mg via INTRAVENOUS

## 2021-05-05 MED ORDER — CHLORHEXIDINE GLUCONATE 0.12 % MT SOLN
OROMUCOSAL | Status: AC
  Administered 2021-05-05: 15 mL via OROMUCOSAL
  Filled 2021-05-05: qty 15

## 2021-05-05 MED ORDER — ORAL CARE MOUTH RINSE: 15.0000 mL | Freq: Once | OROMUCOSAL | Status: AC

## 2021-05-05 MED ORDER — CEFAZOLIN SODIUM-DEXTROSE 2-4 GM/100ML-% IV SOLN
2.0000 g | INTRAVENOUS | Status: AC
  Administered 2021-05-05: 2 g via INTRAVENOUS

## 2021-05-05 MED ORDER — PROPOFOL 10 MG/ML IV BOLUS
INTRAVENOUS | Status: AC
  Filled 2021-05-05: qty 20

## 2021-05-05 MED ORDER — FENTANYL CITRATE (PF) 100 MCG/2ML IJ SOLN
INTRAMUSCULAR | Status: DC | PRN
  Administered 2021-05-05: 25 ug via INTRAVENOUS

## 2021-05-05 MED ORDER — FENTANYL CITRATE (PF) 100 MCG/2ML IJ SOLN
INTRAMUSCULAR | Status: AC
  Filled 2021-05-05: qty 2

## 2021-05-05 MED ORDER — PROPOFOL 500 MG/50ML IV EMUL
INTRAVENOUS | Status: DC | PRN
  Administered 2021-05-05: 100 ug/kg/min via INTRAVENOUS

## 2021-05-05 MED ORDER — FENTANYL CITRATE (PF) 100 MCG/2ML IJ SOLN: 25.0000 ug | INTRAMUSCULAR | Status: DC | PRN

## 2021-05-05 MED ORDER — CEFAZOLIN SODIUM-DEXTROSE 2-4 GM/100ML-% IV SOLN
INTRAVENOUS | Status: AC
  Filled 2021-05-05: qty 100

## 2021-05-05 MED ORDER — ONDANSETRON HCL 4 MG/2ML IJ SOLN
INTRAMUSCULAR | Status: DC | PRN
  Administered 2021-05-05: 4 mg via INTRAVENOUS

## 2021-05-05 MED ORDER — ACETAMINOPHEN 10 MG/ML IV SOLN
INTRAVENOUS | Status: DC | PRN
Start: 2021-05-05 — End: 2021-05-05
  Administered 2021-05-05: 1000 mg via INTRAVENOUS

## 2021-05-05 MED ORDER — CHLORHEXIDINE GLUCONATE 0.12 % MT SOLN: 15.0000 mL | Freq: Once | OROMUCOSAL | Status: AC

## 2021-05-05 MED ORDER — FAMOTIDINE 20 MG PO TABS
ORAL_TABLET | ORAL | Status: AC
  Administered 2021-05-05: 20 mg via ORAL
  Filled 2021-05-05: qty 1

## 2021-05-05 SURGICAL SUPPLY — 19 items
BAG DRAIN CYSTO-URO LG1000N (MISCELLANEOUS) ×2 IMPLANT
BRUSH SCRUB EZ  4% CHG (MISCELLANEOUS) ×2
BRUSH SCRUB EZ 4% CHG (MISCELLANEOUS) ×1 IMPLANT
DRSG TELFA 4X3 1S NADH ST (GAUZE/BANDAGES/DRESSINGS) ×2 IMPLANT
ELECT REM PT RETURN 9FT ADLT (ELECTROSURGICAL) ×2
ELECTRODE REM PT RTRN 9FT ADLT (ELECTROSURGICAL) ×1 IMPLANT
GAUZE 4X4 16PLY ~~LOC~~+RFID DBL (SPONGE) ×4 IMPLANT
GLOVE SURG ENC MOIS LTX SZ6.5 (GLOVE) ×2 IMPLANT
GOWN STRL REUS W/ TWL LRG LVL3 (GOWN DISPOSABLE) ×2 IMPLANT
GOWN STRL REUS W/TWL LRG LVL3 (GOWN DISPOSABLE) ×4
KIT TURNOVER CYSTO (KITS) ×2 IMPLANT
NDL SAFETY ECLIPSE 18X1.5 (NEEDLE) ×1 IMPLANT
NEEDLE HYPO 18GX1.5 SHARP (NEEDLE) ×2
PACK CYSTO AR (MISCELLANEOUS) ×2 IMPLANT
SET CYSTO W/LG BORE CLAMP LF (SET/KITS/TRAYS/PACK) ×2 IMPLANT
SURGILUBE 2OZ TUBE FLIPTOP (MISCELLANEOUS) ×2 IMPLANT
WATER STERILE IRR 1000ML POUR (IV SOLUTION) ×2 IMPLANT
WATER STERILE IRR 3000ML UROMA (IV SOLUTION) ×2 IMPLANT
WATER STERILE IRR 500ML POUR (IV SOLUTION) ×2 IMPLANT

## 2021-05-05 NOTE — Discharge Instructions (Addendum)

## 2021-05-05 NOTE — Interval H&P Note (Signed)
History and Physical Interval Note:  05/05/2021 2:51 PM  Tracey Wolf  has presented today for surgery, with the diagnosis of Bladder Lesion.  The various methods of treatment have been discussed with the patient and family. After consideration of risks, benefits and other options for treatment, the patient has consented to  Procedure(s): CYSTOSCOPY WITH BLADDER BIOPSY (N/A) as a surgical intervention.  The patient's history has been reviewed, patient examined, no change in status, stable for surgery.  I have reviewed the patient's chart and labs.  Questions were answered to the patient's satisfaction.      RRR CTAB   Hollice Espy

## 2021-05-05 NOTE — Op Note (Signed)
Date of procedure: 05/05/21  Preoperative diagnosis:  Urgency/frequency/dysuria Bladder lesion  Postoperative diagnosis:  Same as above  Procedure: Cystoscopy with bladder biopsy  Surgeon: Hollice Espy, MD  Anesthesia: Monitored anesthesia care  Complications: None  Intraoperative findings: Approximately 1 cm area at the trigone with what appears to be possibly squamous metaplasia and a thrombosed vessel.  There is also some what looks like squamous metaplasia of the bladder neck.  Each of these areas were biopsied and completely fulgurated.  No other bladder lesions were identified.  EBL: Minimal  Specimens: Bladder biopsy  Drains: None  Indication: Tracey Wolf is a 70 y.o. patient with dysuria/urgency frequency found to have a heaped up whitish area at the trigone which is noncontinuous to another area of the bladder neck which appears to be consistent with squamous metaplasia.  She was offered a biopsy versus surveillance and elected biopsy.  Notably, urine cytology was negative for high-grade cells.  After reviewing the management options for treatment, she elected to proceed with the above surgical procedure(s). We have discussed the potential benefits and risks of the procedure, side effects of the proposed treatment, the likelihood of the patient achieving the goals of the procedure, and any potential problems that might occur during the procedure or recuperation. Informed consent has been obtained.  Description of procedure:  The patient was taken to the operating room and conscious sedation.  The patient was placed in the dorsal lithotomy position, prepped and draped in the usual sterile fashion, and preoperative antibiotics were administered. A preoperative time-out was performed.   3 1 [place] scope was advanced per urethra into the bladder.  The previously forementioned area at the trigone as well as bladder neck was identified but no other pathology was  appreciated.  Cold cup biopsies were used to biopsy/resect each of these areas which were sent together.  The base of this was then fulgurated using Bovie electrocautery until hemostasis was adequate.  The bladder was then drained and the scope was removed.  The patient was then cleaned and dried, repositioned in the supine position and taken to the PACU in stable condition.  Plan: I will call her with her pathology report.  I will have her follow-up with either Debroah Loop or Lavell Islam in a month to reassess her irritative urinary symptoms.  Hollice Espy, M.D.

## 2021-05-05 NOTE — Anesthesia Preprocedure Evaluation (Signed)
Anesthesia Evaluation  Patient identified by MRN, date of birth, ID band Patient awake    Reviewed: Allergy & Precautions, NPO status , Patient's Chart, lab work & pertinent test results  Airway Mallampati: III  TM Distance: >3 FB Neck ROM: full    Dental  (+) Chipped, Poor Dentition, Missing   Pulmonary neg shortness of breath, former smoker,    Pulmonary exam normal        Cardiovascular Exercise Tolerance: Good (-) angina(-) Past MI and (-) DOE negative cardio ROS Normal cardiovascular exam     Neuro/Psych negative neurological ROS  negative psych ROS   GI/Hepatic negative GI ROS, Neg liver ROS,   Endo/Other  negative endocrine ROS  Renal/GU negative Renal ROS  negative genitourinary   Musculoskeletal   Abdominal   Peds  Hematology negative hematology ROS (+)   Anesthesia Other Findings Past Medical History: No date: B12 deficiency 04/2015: Endometriosis No date: Hyperlipidemia No date: Postmenopausal  Past Surgical History: No date: ABDOMINAL HYSTERECTOMY No date: CERVICAL CONE BIOPSY No date: COLONOSCOPY No date: DILATION AND CURETTAGE OF UTERUS No date: TUBAL LIGATION     Reproductive/Obstetrics negative OB ROS                             Anesthesia Physical Anesthesia Plan  ASA: 3  Anesthesia Plan: General   Post-op Pain Management:    Induction: Intravenous  PONV Risk Score and Plan: Propofol infusion and TIVA  Airway Management Planned: Natural Airway and Nasal Cannula  Additional Equipment:   Intra-op Plan:   Post-operative Plan:   Informed Consent: I have reviewed the patients History and Physical, chart, labs and discussed the procedure including the risks, benefits and alternatives for the proposed anesthesia with the patient or authorized representative who has indicated his/her understanding and acceptance.     Dental Advisory Given  Plan  Discussed with: Anesthesiologist, CRNA and Surgeon  Anesthesia Plan Comments: (Patient reports no past problems with propofol.  Per AAAAI recommendations: Soy-Allergic and Egg-Allergic Patients Can Safely Receive Anesthesia, plan to proceed with case utilizing propofol as patient endorses no prior reactions to propofol.  Patient consented for risks of anesthesia including but not limited to:  - adverse reactions to medications - risk of airway placement if required - damage to eyes, teeth, lips or other oral mucosa - nerve damage due to positioning  - sore throat or hoarseness - Damage to heart, brain, nerves, lungs, other parts of body or loss of life  Patient voiced understanding.)        Anesthesia Quick Evaluation

## 2021-05-05 NOTE — Anesthesia Procedure Notes (Signed)
Procedure Name: General with mask airway Date/Time: 05/05/2021 3:28 PM Performed by: Kelton Pillar, CRNA Pre-anesthesia Checklist: Patient identified, Emergency Drugs available, Suction available and Patient being monitored Patient Re-evaluated:Patient Re-evaluated prior to induction Oxygen Delivery Method: Simple face mask Preoxygenation: Pre-oxygenation with 100% oxygen Induction Type: IV induction Placement Confirmation: positive ETCO2 and CO2 detector Dental Injury: Teeth and Oropharynx as per pre-operative assessment

## 2021-05-05 NOTE — Transfer of Care (Signed)
Immediate Anesthesia Transfer of Care Note  Patient: [name]  Procedure(s) Performed: CYSTOSCOPY WITH BLADDER BIOPSY  Patient Location: PACU  Anesthesia Type:General  Level of Consciousness: awake, drowsy and patient cooperative  Airway & Oxygen Therapy: Patient Spontanous Breathing and Patient connected to face mask oxygen  Post-op Assessment: Report given to RN and Post -op Vital signs reviewed and stable  Post vital signs: Reviewed and stable  Last Vitals:  Vitals Value Taken Time  BP    Temp    Pulse 70 05/05/21 1534  Resp 17 05/05/21 1534  SpO2 100 % 05/05/21 1534  Vitals shown include unvalidated device data.  Last Pain:  Vitals:   05/05/21 1319  TempSrc: Temporal  PainSc: 0-No pain         Complications: No notable events documented.

## 2021-05-05 NOTE — Anesthesia Postprocedure Evaluation (Signed)
Anesthesia Post Note  [name]  Procedure(s) Performed: CYSTOSCOPY WITH BLADDER BIOPSY  Patient location during evaluation: PACU Anesthesia Type: General Level of consciousness: awake and alert Pain management: pain level controlled Vital Signs Assessment: post-procedure vital signs reviewed and stable Respiratory status: spontaneous breathing, nonlabored ventilation, respiratory function stable and patient connected to nasal cannula oxygen Cardiovascular status: blood pressure returned to baseline and stable Postop Assessment: no apparent nausea or vomiting Anesthetic complications: no   No notable events documented.   Last Vitals:  Vitals:   05/05/21 1545 05/05/21 1553  BP: 129/61 129/61  Pulse: 67 67  Resp: 20 18  Temp:  36.8 C  SpO2: 97% 100%    Last Pain:  Vitals:   05/05/21 1553  TempSrc: Temporal  PainSc: 0-No pain                 Precious Haws Nimra Puccinelli

## 2021-05-06 ENCOUNTER — Encounter: Payer: Self-pay | Admitting: Urology

## 2021-05-07 ENCOUNTER — Telehealth: Payer: Self-pay | Admitting: *Deleted

## 2021-05-07 LAB — SURGICAL PATHOLOGY

## 2021-05-07 NOTE — Telephone Encounter (Addendum)
Patient informed, voiced understanding. Scheduled follow up.  ----- Message from Hollice Espy, MD sent at 05/07/2021 10:00 AM EDT ----- Please let this patient know that her pathology was benign.  I like her to follow-up in 1 month with Larene Beach to reassess her urinary symptoms.  Hollice Espy, MD

## 2021-06-03 ENCOUNTER — Other Ambulatory Visit: Payer: Self-pay | Admitting: Family Medicine

## 2021-06-03 NOTE — Telephone Encounter (Signed)
Requested medication (s) are due for refill today: yes  Requested medication (s) are on the active medication list: yes  Last refill:  05/28/20 #4 12 RF  Future visit scheduled: yes  Notes to clinic:  med not delegated to NT to RF- over due lab work (Vitamin D and Phosphorus)   Requested Prescriptions  Pending Prescriptions Disp Refills   Vitamin D, Ergocalciferol, (DRISDOL) 1.25 MG (50000 UNIT) CAPS capsule [Pharmacy Med Name: VITAMIN D2 50,000IU (ERGO) CAP RX] 4 capsule 12    Sig: TAKE [place]     Endocrinology:  Vitamins - Vitamin D Supplementation Failed - 06/03/2021  9:57 AM      Failed - 50,000 IU strengths are not delegated      Failed - Phosphate in normal range and within 360 days    No results found for: PHOS        Failed - Vitamin D in normal range and within 360 days    Vit D, 25-Hydroxy  Date Value Ref Range Status  07/23/2016 39.9 30.0 - 100.0 ng/mL Final    Comment:    Vitamin D deficiency has been defined by the Institute of Medicine and an Endocrine Society practice guideline as a level of serum 25-OH vitamin D less than 20 ng/mL (1,2). The Endocrine Society went on to further define vitamin D insufficiency as a level between 21 and 29 ng/mL (2). 1. IOM (Institute of Medicine). 2010. Dietary reference    intakes for calcium and D. [place]: The    [employer]. 2. Holick MF, [place], Bischoff-Ferrari HA, et al.    Evaluation, treatment, and prevention of vitamin D    deficiency: an Endocrine Society clinical practice    guideline. JCEM. 2011 Jul; 96(7):1911-30.           Passed - Ca in normal range and within 360 days    Calcium  Date Value Ref Range Status  10/04/2020 9.3 8.7 - 10.3 mg/dL Final          Passed - Valid encounter within last 12 months    Recent Outpatient Visits           8 months ago Medicare annual wellness visit, subsequent   [facility] Jerrol Banana., MD   1  year ago Annual physical exam   [facility] Jerrol Banana., MD   2 years ago B12 deficiency   [facility] Jerrol Banana., MD   2 years ago Annual physical exam   [facility] Jerrol Banana., MD   2 years ago [place] Trinna Post, [place]       Future Appointments             In 1 week Ernestine Conrad, Gordan Payment [place]   In 4 months Jerrol Banana., MD [facility] [place]   In 9 months Amalia Hailey, Nyoka Lint, MD [facility]

## 2021-06-11 NOTE — Progress Notes (Signed)
07/22/2018 1:30 PM   Ilda Foil [address] [address] 1950-12-22 [id number]  Referring provider: Jerrol Banana., MD [address] [place],  [place] [address]   Chief Complaint  Patient presents with   Follow-up    1 month      Urological history: 1. High risk hematuria -former smoker -CTU 2018 NED -cysto 2018 NED -cysto 2022 - Whitish heaped up area, approximately 1 cm in which there is an embedded what looks like calcification or thrombosed vessel in the central portion of the trigone -bladder biopsy 2022 - UROTHELIUM WITH SQUAMOUS METAPLASIA, CYSTITIS CYSTICA, AND FOCAL AREA OF NON-SPECIFIC STROMAL HEMORRHAGE. - NEGATIVE FOR DYSPLASIA AND MALIGNANCY -seen by nephrology-no recommendations -no reports of gross hematuria -UA negative for micro heme  HPI: Tracey Wolf is a 70 y.o. female who presents today to reassess her bladder symptoms after undergoing a bladder biopsy.    UA negative  She states she is feeling well.  She no longer has any urinary symptoms.  Patient denies any modifying or aggravating factors.  Patient denies any gross hematuria, dysuria or suprapubic/flank pain.  Patient denies any fevers, chills, nausea or vomiting.     PMH: Past Medical History:  Diagnosis Date   B12 deficiency    Endometriosis 04/2015   Hyperlipidemia    Postmenopausal     Surgical History: Past Surgical History:  Procedure Laterality Date   ABDOMINAL HYSTERECTOMY     CERVICAL CONE BIOPSY     COLONOSCOPY     CYSTOSCOPY WITH BIOPSY N/A 05/05/2021   Procedure: CYSTOSCOPY WITH BLADDER BIOPSY;  Surgeon: Hollice Espy, MD;  Location: [facility] ORS;  Service: Urology;  Laterality: N/A;   DILATION AND CURETTAGE OF UTERUS     TUBAL LIGATION      Home Medications:  Allergies as of 06/12/2021       Reactions   Bee Venom Anaphylaxis   Iodinated Diagnostic Agents Anaphylaxis   Iohexol Anaphylaxis    Desc: had severe reaction in october '05   swelling, sob and throat  closing   Cefaclor    Eggs Or Egg-derived Products Nausea And Vomiting   Doxycycline Rash   Latex Rash        Medication List        Accurate as of June 12, 2021 11:59 PM. If you have any questions, ask your nurse or doctor.          STOP taking these medications    fluconazole 150 MG tablet Commonly known as: DIFLUCAN Stopped by: Cale Bethard, PA-C   Gemtesa 75 MG Tabs Generic drug: Vibegron Stopped by: Zara Council, PA-C       TAKE these medications    B-D 3CC LUER-LOK SYR 25GX5/8" 25G X 5/8" 3 ML Misc Generic drug: SYRINGE-NEEDLE (DISP) 3 ML USE AS DIRECTED   BD Disp Needles 25G X 5/8" Misc Generic drug: NEEDLE (DISP) 25 G USE AS DIRECTED WITH B12 SHOTS   cyanocobalamin 1000 MCG/ML injection Commonly known as: (VITAMIN B-12) Inject 1 mL into skin every 6 days.   EPINEPHrine 0.3 mg/0.3 mL Soaj injection Commonly known as: EPI-PEN INJECT INTRAMUSCULARLY AS DIRECTED   estradiol 0.05 MG/24HR patch Commonly known as: VIVELLE-DOT APPLY 1 PATCH(0.05 MG) TOPICALLY TO THE SKIN 2 TIMES A WEEK   Vitamin D (Ergocalciferol) 1.25 MG (50000 UNIT) Caps capsule Commonly known as: DRISDOL TAKE 1 CAPSULE BY MOUTH EVERY WEEK        Allergies:  Allergies  Allergen Reactions   Bee Venom  Anaphylaxis   Iodinated Diagnostic Agents Anaphylaxis   Iohexol Anaphylaxis     Desc: had severe reaction in october '05   swelling, sob and throat closing    Cefaclor    Eggs Or Egg-Derived Products Nausea And Vomiting   Doxycycline Rash   Latex Rash    Family History: Family History  Problem Relation Age of Onset   Diabetes Mother    Heart attack Mother    COPD Father    Pulmonary embolism Father    Hyperlipidemia Sister    Hyperlipidemia Daughter    Hyperlipidemia Brother    Kidney disease Brother    Kidney Stones Brother    Kidney cancer Neg Hx    Prostate cancer Neg Hx    Colon cancer Neg Hx    Colon polyps Neg Hx    Esophageal cancer Neg Hx     Rectal cancer Neg Hx    Stomach cancer Neg Hx    Breast cancer Neg Hx     Social History:  reports that she quit smoking about 30 years ago. Her smoking use included cigarettes. She has a 10.00 pack-year smoking history. She has never used smokeless tobacco. She reports current alcohol use. She reports that she does not use drugs.  ROS: For pertinent review of systems please refer to history of present illness   Physical Exam: BP 109/67   Pulse 81   Ht 5\' 7"  (1.702 m)   Wt 130 lb (59 kg)   BMI 20.36 kg/m   Constitutional:  Well nourished. Alert and oriented, No acute distress. HEENT: [place] AT, mask in place.  Trachea midline Cardiovascular: No clubbing, cyanosis, or edema. Respiratory: Normal respiratory effort, no increased work of breathing. Neurologic: Grossly intact, no focal deficits, moving all 4 extremities. Psychiatric: Normal mood and affect.    Laboratory Data: Component     Latest Ref Rng & Units 06/12/2021  Specific Gravity, UA     1.005 - 1.030 1.010  pH, UA     5.0 - 7.5 6.5  Color, UA     Yellow Yellow  Appearance Ur     Clear Clear  Leukocytes,UA     Negative Negative  Protein,UA     Negative/Trace Negative  Glucose, UA     Negative Negative  Ketones, UA     Negative Negative  RBC, UA     Negative Negative  Bilirubin, UA     Negative Negative  Urobilinogen, Ur     0.2 - 1.0 mg/dL 0.2  Nitrite, UA     Negative Negative  Microscopic Examination      See below:   Component     Latest Ref Rng & Units 06/12/2021  WBC, UA     0 - 5 /hpf None seen  RBC     0 - 2 /hpf None seen  Epithelial Cells (non renal)     0 - 10 /hpf 0-10  Bacteria, UA     None seen/Few None seen  I have reviewed the labs.    Pertinent imaging N/A    Assessment & Plan:   1. Dysuria  -resolved  2. Suprapubic pain/LBP -resolved  Return in about 1 year (around 06/12/2022) for 1year follow up w/UA.  These notes generated with voice recognition software. I  apologize for typographical errors.  Zara Council, PA-C  [facility] [address] [place], [place], [place] [address] [phone]

## 2021-06-12 ENCOUNTER — Other Ambulatory Visit: Payer: Self-pay

## 2021-06-12 ENCOUNTER — Ambulatory Visit: Payer: Medicare Other | Admitting: Urology

## 2021-06-12 ENCOUNTER — Encounter: Payer: Self-pay | Admitting: Urology

## 2021-06-12 VITALS — BP 109/67 | HR 81 | Ht 67.0 in | Wt 130.0 lb

## 2021-06-12 DIAGNOSIS — R300 Dysuria: Secondary | ICD-10-CM | POA: Diagnosis not present

## 2021-06-13 LAB — URINALYSIS, COMPLETE
Bilirubin, UA: NEGATIVE
Glucose, UA: NEGATIVE
Ketones, UA: NEGATIVE
Leukocytes,UA: NEGATIVE
Nitrite, UA: NEGATIVE
Protein,UA: NEGATIVE
RBC, UA: NEGATIVE
Specific Gravity, UA: 1.01 (ref 1.005–1.030)
Urobilinogen, Ur: 0.2 mg/dL (ref 0.2–1.0)
pH, UA: 6.5 (ref 5.0–7.5)

## 2021-06-13 LAB — MICROSCOPIC EXAMINATION
Bacteria, UA: NONE SEEN
RBC, Urine: NONE SEEN /hpf (ref 0–2)
WBC, UA: NONE SEEN /hpf (ref 0–5)

## 2021-06-25 ENCOUNTER — Other Ambulatory Visit: Payer: Self-pay | Admitting: Family Medicine

## 2021-06-25 DIAGNOSIS — E538 Deficiency of other specified B group vitamins: Secondary | ICD-10-CM

## 2021-06-25 NOTE — Telephone Encounter (Signed)
Requested medications are due for refill today.  yes  Requested medications are on the active medications list.  yes  Last refill. 06/25/2020  Future visit scheduled.   yes  Notes to clinic.  Medication not assigned to a protocol.

## 2021-08-27 DIAGNOSIS — H2513 Age-related nuclear cataract, bilateral: Secondary | ICD-10-CM | POA: Diagnosis not present

## 2021-09-04 ENCOUNTER — Encounter: Payer: Self-pay | Admitting: Family Medicine

## 2021-09-04 ENCOUNTER — Ambulatory Visit (INDEPENDENT_AMBULATORY_CARE_PROVIDER_SITE_OTHER): Payer: Medicare Other | Admitting: Family Medicine

## 2021-09-04 ENCOUNTER — Other Ambulatory Visit: Payer: Self-pay

## 2021-09-04 VITALS — BP 126/74 | HR 71 | Temp 98.4°F | Resp 16 | Ht 67.0 in | Wt 131.0 lb

## 2021-09-04 DIAGNOSIS — R300 Dysuria: Secondary | ICD-10-CM

## 2021-09-04 DIAGNOSIS — N949 Unspecified condition associated with female genital organs and menstrual cycle: Secondary | ICD-10-CM | POA: Diagnosis not present

## 2021-09-04 DIAGNOSIS — N898 Other specified noninflammatory disorders of vagina: Secondary | ICD-10-CM

## 2021-09-04 LAB — POCT URINALYSIS DIPSTICK
Bilirubin, UA: NEGATIVE
Blood, UA: NEGATIVE
Glucose, UA: NEGATIVE
Ketones, UA: NEGATIVE
Leukocytes, UA: NEGATIVE
Nitrite, UA: NEGATIVE
Protein, UA: NEGATIVE
Spec Grav, UA: <=1.005 — AB (ref 1.010–1.025)
Urobilinogen, UA: 0.2 E.U./dL
pH, UA: 7 (ref 5.0–8.0)

## 2021-09-04 MED ORDER — NITROFURANTOIN MONOHYD MACRO 100 MG PO CAPS: 100.0000 mg | ORAL_CAPSULE | Freq: Two times a day (BID) | ORAL | 0 refills | Status: DC

## 2021-09-04 NOTE — Progress Notes (Signed)
Established patient visit  I,[name],acting as a scribe for Wilhemena Durie, MD.,have documented all relevant documentation on the behalf of Wilhemena Durie, MD,as directed by  Wilhemena Durie, MD while in the presence of Wilhemena Durie, MD.   [name]   DOB: Jun 18, 1951   71 y.o. Female  MRN: [id number] Visit Date: 09/04/2021  Today's healthcare provider: Wilhemena Durie, MD   Chief Complaint  Patient presents with   Dysuria   Subjective    Dysuria  This is a recurrent problem. The current episode started in the past 7 days (5 days). The problem has been gradually worsening. The quality of the pain is described as burning and aching. There has been no fever. Associated symptoms include a discharge, flank pain, frequency, hesitancy, nausea and urgency. Pertinent negatives include no chills, hematuria, possible pregnancy, sweats or vomiting. Treatments tried: yeast infection medication otc. The treatment provided mild relief. Her past medical history is significant for recurrent UTIs.    Patient states she has recurrent UTI's and yeast infections. Patient states she has had burning and vaginal itching for the past 5 days. Patient also has symptoms of frequency, urgency, flank pain, and vaginal discharge. Patient has been treating symptoms with otc yeast infection cream with mild relief.  She has had a complete work-up recently including cystoscopy. The patient states it feels like it could be a UTI or a yeast infection.  No vaginal discharge or knowledge but she is irritated Medications: Outpatient Medications Prior to Visit  Medication Sig   B-D 3CC LUER-LOK SYR 25GX5/8" 25G X 5/8" 3 ML MISC USE AS DIRECTED   BD DISP NEEDLES 25G X 5/8" MISC USE AS DIRECTED WITH B12 SHOTS   cyanocobalamin (,VITAMIN B-12,) 1000 MCG/ML injection INJECT 1 ML INTO THE SKIN EVERY 6 DAYS   EPINEPHrine 0.3 mg/0.3 mL IJ SOAJ injection INJECT INTRAMUSCULARLY AS DIRECTED    estradiol (VIVELLE-DOT) 0.05 MG/24HR patch APPLY 1 PATCH(0.05 MG) TOPICALLY TO THE SKIN 2 TIMES A WEEK   Vitamin D, Ergocalciferol, (DRISDOL) 1.25 MG (50000 UNIT) CAPS capsule TAKE 1 CAPSULE BY MOUTH EVERY WEEK   Facility-Administered Medications Prior to Visit  Medication Dose Route Frequency Provider   0.9 %  sodium chloride infusion  500 mL Intravenous Once Irene Shipper, MD    Review of Systems  Constitutional:  Negative for appetite change, chills, fatigue and fever.  Respiratory:  Negative for chest tightness and shortness of breath.   Cardiovascular:  Negative for chest pain and palpitations.  Gastrointestinal:  Positive for nausea. Negative for abdominal pain and vomiting.  Genitourinary:  Positive for dysuria, flank pain, frequency, hesitancy and urgency. Negative for hematuria.  Neurological:  Negative for dizziness and weakness.       Objective    BP 126/74 (BP Location: Right Arm, Patient Position: Sitting, Cuff Size: Normal)    Pulse 71    Temp 98.4 F (36.9 C) (Temporal)    Resp 16    Ht 5\' 7"  (1.702 m)    Wt 131 lb (59.4 kg)    SpO2 96%    BMI 20.52 kg/m  Wt Readings from Last 3 Encounters:  09/04/21 131 lb (59.4 kg)  06/12/21 130 lb (59 kg)  04/28/21 131 lb (59.4 kg)      Physical Exam Vitals reviewed.  Constitutional:      General: She is not in acute distress.    Appearance: She is well-developed.  HENT:  Head: Normocephalic and atraumatic.     Right Ear: Hearing normal.     Left Ear: Hearing normal.     Nose: Nose normal.  Eyes:     General: Lids are normal. No scleral icterus.       Right eye: No discharge.        Left eye: No discharge.     Conjunctiva/sclera: Conjunctivae normal.  Cardiovascular:     Rate and Rhythm: Normal rate and regular rhythm.     Heart sounds: Normal heart sounds.  Pulmonary:     Effort: Pulmonary effort is normal. No respiratory distress.  Abdominal:     Palpations: Abdomen is soft.     Tenderness: There is no  right CVA tenderness or left CVA tenderness.     Comments: Very very minimal suprapubic and left lower quadrant comfort with exam.  Would not call it tenderness  Skin:    Findings: No lesion or rash.  Neurological:     General: No focal deficit present.     Mental Status: She is alert and oriented to person, place, and time.     Coordination: Coordination normal.  Psychiatric:        Mood and Affect: Mood normal.        Speech: Speech normal.        Behavior: Behavior normal.        Thought Content: Thought content normal.        Judgment: Judgment normal.      No results found for any visits on 09/04/21.  Assessment & Plan     1. Dysuria I do not think clinically she has UTI but we will give her Macrobid for 3 days with her history. Urine culture pending - POCT urinalysis dipstick--Negative - NuSwab Vaginitis Plus (VG+) - CULTURE, URINE COMPREHENSIVE - nitrofurantoin, macrocrystal-monohydrate, (MACROBID) 100 MG capsule; Take 1 capsule (100 mg total) by mouth 2 (two) times daily.  Dispense: 6 capsule; Refill: 0  2. Vaginal itching  - NuSwab Vaginitis Plus (VG+) - CULTURE, URINE COMPREHENSIVE - nitrofurantoin, macrocrystal-monohydrate, (MACROBID) 100 MG capsule; Take 1 capsule (100 mg total) by mouth 2 (two) times daily.  Dispense: 6 capsule; Refill: 0  3. Vaginal burning  - NuSwab Vaginitis Plus (VG+) - CULTURE, URINE COMPREHENSIVE - nitrofurantoin, macrocrystal-monohydrate, (MACROBID) 100 MG capsule; Take 1 capsule (100 mg total) by mouth 2 (two) times daily.  Dispense: 6 capsule; Refill: 0   No follow-ups on file.      I, Wilhemena Durie, MD, have reviewed all documentation for this visit. The documentation on 09/05/21 for the exam, diagnosis, procedures, and orders are all accurate and complete.    Crystin Lechtenberg Cranford Mon, MD  [facility] [phone] (phone) [phone] (fax)  [place]

## 2021-09-09 LAB — NUSWAB VAGINITIS PLUS (VG+)
Atopobium vaginae: HIGH Score — AB
Candida albicans, NAA: NEGATIVE
Candida glabrata, NAA: NEGATIVE

## 2021-09-10 DIAGNOSIS — H2512 Age-related nuclear cataract, left eye: Secondary | ICD-10-CM | POA: Diagnosis not present

## 2021-09-10 LAB — CULTURE, URINE COMPREHENSIVE

## 2021-09-22 ENCOUNTER — Encounter: Payer: Self-pay | Admitting: Ophthalmology

## 2021-09-25 NOTE — Discharge Instructions (Signed)

## 2021-09-29 ENCOUNTER — Ambulatory Visit: Payer: Medicare Other | Admitting: Anesthesiology

## 2021-09-29 ENCOUNTER — Encounter
Admission: RE | Disposition: A | Discharge status: Home / Self Care | Payer: Self-pay | Source: Ambulatory Visit | Attending: Ophthalmology

## 2021-09-29 ENCOUNTER — Encounter: Payer: Self-pay | Admitting: Ophthalmology

## 2021-09-29 ENCOUNTER — Other Ambulatory Visit: Payer: Self-pay

## 2021-09-29 ENCOUNTER — Ambulatory Visit
Admission: RE | Admit: 2021-09-29 | Discharge: 2021-09-29 | Disposition: A | Discharge status: Home / Self Care | Payer: Medicare Other | Source: Ambulatory Visit | Attending: Ophthalmology | Admitting: Ophthalmology

## 2021-09-29 DIAGNOSIS — H25812 Combined forms of age-related cataract, left eye: Secondary | ICD-10-CM | POA: Diagnosis not present

## 2021-09-29 DIAGNOSIS — D649 Anemia, unspecified: Secondary | ICD-10-CM | POA: Diagnosis not present

## 2021-09-29 DIAGNOSIS — Z87891 Personal history of nicotine dependence: Secondary | ICD-10-CM | POA: Diagnosis not present

## 2021-09-29 DIAGNOSIS — H2512 Age-related nuclear cataract, left eye: Secondary | ICD-10-CM | POA: Diagnosis not present

## 2021-09-29 DIAGNOSIS — E538 Deficiency of other specified B group vitamins: Secondary | ICD-10-CM | POA: Insufficient documentation

## 2021-09-29 DIAGNOSIS — D759 Disease of blood and blood-forming organs, unspecified: Secondary | ICD-10-CM | POA: Diagnosis not present

## 2021-09-29 HISTORY — PX: CATARACT EXTRACTION W/PHACO: SHX586

## 2021-09-29 SURGERY — PHACOEMULSIFICATION, CATARACT, WITH IOL INSERTION
Anesthesia: Monitor Anesthesia Care | Site: Eye | Laterality: Left

## 2021-09-29 MED ORDER — SIGHTPATH DOSE#1 SODIUM HYALURONATE 10 MG/ML IO SOLUTION
PREFILLED_SYRINGE | INTRAOCULAR | Status: DC | PRN
  Administered 2021-09-29: 0.85 mL via INTRAOCULAR

## 2021-09-29 MED ORDER — FENTANYL CITRATE (PF) 100 MCG/2ML IJ SOLN
INTRAMUSCULAR | Status: DC | PRN
  Administered 2021-09-29: 50 ug via INTRAVENOUS

## 2021-09-29 MED ORDER — LACTATED RINGERS IV SOLN: INTRAVENOUS | Status: DC

## 2021-09-29 MED ORDER — TETRACAINE HCL 0.5 % OP SOLN
1.0000 [drp] | OPHTHALMIC | Status: DC | PRN
  Administered 2021-09-29 (×3): 1 [drp] via OPHTHALMIC

## 2021-09-29 MED ORDER — ACETAMINOPHEN 160 MG/5ML PO SOLN: 975.0000 mg | Freq: Once | ORAL | Status: DC | PRN

## 2021-09-29 MED ORDER — ONDANSETRON HCL 4 MG/2ML IJ SOLN: 4.0000 mg | Freq: Once | INTRAMUSCULAR | Status: DC | PRN

## 2021-09-29 MED ORDER — SIGHTPATH DOSE#1 BSS IO SOLN
INTRAOCULAR | Status: DC | PRN
  Administered 2021-09-29: 15 mL

## 2021-09-29 MED ORDER — MOXIFLOXACIN HCL 0.5 % OP SOLN
OPHTHALMIC | Status: DC | PRN
Start: 2021-09-29 — End: 2021-09-29
  Administered 2021-09-29: 0.2 mL via OPHTHALMIC

## 2021-09-29 MED ORDER — MIDAZOLAM HCL 2 MG/2ML IJ SOLN
INTRAMUSCULAR | Status: DC | PRN
  Administered 2021-09-29: 1 mg via INTRAVENOUS

## 2021-09-29 MED ORDER — SIGHTPATH DOSE#1 SODIUM HYALURONATE 23 MG/ML IO SOLUTION
PREFILLED_SYRINGE | INTRAOCULAR | Status: DC | PRN
  Administered 2021-09-29: 0.6 mL via INTRAOCULAR

## 2021-09-29 MED ORDER — ACETAMINOPHEN 500 MG PO TABS: 1000.0000 mg | ORAL_TABLET | Freq: Once | ORAL | Status: DC | PRN

## 2021-09-29 MED ORDER — LIDOCAINE HCL (PF) 2 % IJ SOLN
INTRAOCULAR | Status: DC | PRN
  Administered 2021-09-29: 1 mL via INTRAOCULAR

## 2021-09-29 MED ORDER — ARMC OPHTHALMIC DILATING DROPS
1.0000 "application " | OPHTHALMIC | Status: DC | PRN
  Administered 2021-09-29 (×3): 1 via OPHTHALMIC

## 2021-09-29 MED ORDER — SIGHTPATH DOSE#1 BSS IO SOLN
INTRAOCULAR | Status: DC | PRN
  Administered 2021-09-29: 74 mL via OPHTHALMIC

## 2021-09-29 SURGICAL SUPPLY — 22 items
CANNULA ANT/CHMB 27G (MISCELLANEOUS) IMPLANT
CANNULA ANT/CHMB 27GA (MISCELLANEOUS) IMPLANT
CATARACT SUITE SIGHTPATH (MISCELLANEOUS) ×2 IMPLANT
DISSECTOR HYDRO NUCLEUS 50X22 (MISCELLANEOUS) ×2 IMPLANT
FEE CATARACT SUITE SIGHTPATH (MISCELLANEOUS) ×1 IMPLANT
GLOVE SURG GAMMEX PI TX LF 7.5 (GLOVE) ×2 IMPLANT
GLOVE SURG SYN 8.5  E (GLOVE) ×2
GLOVE SURG SYN 8.5 E (GLOVE) ×1 IMPLANT
GLOVE SURG SYN 8.5 PF PI (GLOVE) ×1 IMPLANT
LENS IOL IQ PAN TRC 30 13.5 IMPLANT
LENS IOL PANOP TORIC 30 13.5 ×1 IMPLANT
LENS IOL PANOPTIX TORIC 13.5 ×2 IMPLANT
NDL FILTER BLUNT 18X1 1/2 (NEEDLE) ×1 IMPLANT
NEEDLE FILTER BLUNT 18X 1/2SAF (NEEDLE) ×1
NEEDLE FILTER BLUNT 18X1 1/2 (NEEDLE) ×1 IMPLANT
PACK VIT ANT 23G (MISCELLANEOUS) IMPLANT
RING MALYGIN (MISCELLANEOUS) IMPLANT
SUT ETHILON 10-0 CS-B-6CS-B-6 (SUTURE)
SUTURE EHLN 10-0 CS-B-6CS-B-6 (SUTURE) IMPLANT
SYR 3ML LL SCALE MARK (SYRINGE) ×2 IMPLANT
SYR 5ML LL (SYRINGE) ×2 IMPLANT
WATER STERILE IRR 250ML POUR (IV SOLUTION) ×2 IMPLANT

## 2021-09-29 NOTE — Op Note (Signed)
OPERATIVE NOTE  Tracey Wolf [id number] 09/29/2021   PREOPERATIVE DIAGNOSIS:  Nuclear sclerotic cataract left eye.  H25.12   POSTOPERATIVE DIAGNOSIS:    Nuclear sclerotic cataract left eye.     PROCEDURE:  Phacoemusification with posterior chamber intraocular lens placement of the left eye   LENS:   Implant Name Type Inv. Item Serial No. Manufacturer Lot No. LRB No. Used Action  LENS IOL PANOPTIX TORIC 13.5 - [id number]  LENS IOL PANOPTIX TORIC 13.5 [id number] [employer]  Left 1 Implanted      Procedure(s) with comments: CATARACT EXTRACTION PHACO AND INTRAOCULAR LENS PLACEMENT (IOC) LEFT PANOPTIX TORIC (Left) - 1.85 0:21.7  [id number] +13.5   ULTRASOUND TIME: 0 minutes 21 seconds.  CDE 1.85   SURGEON:  Benay Pillow, MD, MPH   ANESTHESIA:  Topical with tetracaine drops augmented with 1% preservative-free intracameral lidocaine.  ESTIMATED BLOOD LOSS: <1 mL   COMPLICATIONS:  None.   DESCRIPTION OF PROCEDURE:  The patient was identified in the holding room and transported to the operating room and placed in the supine position under the operating microscope.  The left eye was identified as the operative eye and it was prepped and draped in the usual sterile ophthalmic fashion.  The verion system was registered without difficulty.   A 1.0 millimeter clear-corneal paracentesis was made at the 5:00 position. 0.5 ml of preservative-free 1% lidocaine with epinephrine was injected into the anterior chamber.  The anterior chamber was filled with Healon 5 viscoelastic.  A 2.4 millimeter keratome was used to make a near-clear corneal incision at the 2:00 position.  A curvilinear capsulorrhexis was made with a cystotome and capsulorrhexis forceps.  Balanced salt solution was used to hydrodissect and hydrodelineate the nucleus.   Phacoemulsification was then used in stop and chop fashion to remove the lens nucleus and epinucleus.  The remaining cortex was then removed using the irrigation  and aspiration handpiece. Healon was then placed into the capsular bag to distend it for lens placement.  A lens was then injected into the capsular bag.  The remaining viscoelastic was aspirated.  The lens was rotated with guidance from the [place] system.   Wounds were hydrated with balanced salt solution.  The anterior chamber was inflated to a physiologic pressure with balanced salt solution.  Intracameral vigamox 0.1 mL undiltued was injected into the eye and a drop placed onto the ocular surface.  No wound leaks were noted.  The patient was taken to the recovery room in stable condition without complications of anesthesia or surgery  Benay Pillow 09/29/2021, 11:56 AM

## 2021-09-29 NOTE — Anesthesia Postprocedure Evaluation (Signed)
Anesthesia Post Note  [name]  Procedure(s) Performed: CATARACT EXTRACTION PHACO AND INTRAOCULAR LENS PLACEMENT (IOC) LEFT PANOPTIX TORIC (Left: Eye)     Patient location during evaluation: PACU Anesthesia Type: MAC Level of consciousness: awake and alert Pain management: pain level controlled Vital Signs Assessment: post-procedure vital signs reviewed and stable Respiratory status: spontaneous breathing, nonlabored ventilation and respiratory function stable Cardiovascular status: stable and blood pressure returned to baseline Postop Assessment: no apparent nausea or vomiting Anesthetic complications: no   No notable events documented.  April Manson

## 2021-09-29 NOTE — Transfer of Care (Signed)
Immediate Anesthesia Transfer of Care Note  [name]  Procedure(s) Performed: CATARACT EXTRACTION PHACO AND INTRAOCULAR LENS PLACEMENT (IOC) LEFT PANOPTIX TORIC (Left: Eye)  Patient Location: PACU  Anesthesia Type: MAC  Level of Consciousness: awake, alert  and patient cooperative  Airway and Oxygen Therapy: Patient Spontanous Breathing and Patient connected to supplemental oxygen  Post-op Assessment: Post-op Vital signs reviewed, Patient's Cardiovascular Status Stable, Respiratory Function Stable, Patent Airway and No signs of Nausea or vomiting  Post-op Vital Signs: Reviewed and stable  Complications: No notable events documented.

## 2021-09-29 NOTE — H&P (Signed)
[facility]   Primary Care Physician:  Jerrol Banana., MD Ophthalmologist: Dr. Benay Pillow  Pre-Procedure History & Physical: HPI:  Tracey Wolf is a 71 y.o. female here for cataract surgery.   Past Medical History:  Diagnosis Date   B12 deficiency    Endometriosis 04/2015   Hyperlipidemia    Postmenopausal     Past Surgical History:  Procedure Laterality Date   ABDOMINAL HYSTERECTOMY     CERVICAL CONE BIOPSY     COLONOSCOPY     CYSTOSCOPY WITH BIOPSY N/A 05/05/2021   Procedure: CYSTOSCOPY WITH BLADDER BIOPSY;  Surgeon: Hollice Espy, MD;  Location: [facility] ORS;  Service: Urology;  Laterality: N/A;   DILATION AND CURETTAGE OF UTERUS     TUBAL LIGATION      Prior to Admission medications   Medication Sig Start Date End Date Taking? Authorizing Provider  B-D 3CC LUER-LOK SYR 25GX5/8" 25G X 5/8" 3 ML MISC USE AS DIRECTED 08/19/20  Yes Jerrol Banana., MD  BD DISP NEEDLES 25G X 5/8" MISC USE AS DIRECTED WITH B12 SHOTS 10/17/18  Yes [provider]  cyanocobalamin (,VITAMIN B-12,) 1000 MCG/ML injection INJECT 1 ML INTO THE SKIN EVERY 6 DAYS 06/30/21  Yes Jerrol Banana., MD  estradiol (VIVELLE-DOT) 0.05 MG/24HR patch APPLY 1 PATCH(0.05 MG) TOPICALLY TO THE SKIN 2 TIMES A WEEK 02/27/21  Yes Harlin Heys, MD  Vitamin D, Ergocalciferol, (DRISDOL) 1.25 MG (50000 UNIT) CAPS capsule TAKE 1 CAPSULE BY MOUTH EVERY WEEK 06/03/21  Yes Jerrol Banana., MD  EPINEPHrine 0.3 mg/0.3 mL IJ SOAJ injection INJECT INTRAMUSCULARLY AS DIRECTED 09/13/19   Jerrol Banana., MD  nitrofurantoin, macrocrystal-monohydrate, (MACROBID) 100 MG capsule Take 1 capsule (100 mg total) by mouth 2 (two) times daily. Patient not taking: Reported on 09/22/2021 09/04/21   Jerrol Banana., MD    Allergies as of 09/01/2021 - Review Complete 06/12/2021  Allergen Reaction Noted   Bee venom Anaphylaxis 04/25/2015   Iodinated contrast media Anaphylaxis 04/25/2015    Iohexol Anaphylaxis 08/07/2004   Cefaclor  04/25/2015   Eggs or egg-derived products Nausea And Vomiting 04/22/2015   Doxycycline Rash 04/25/2015   Latex Rash 06/14/2017    Family History  Problem Relation Age of Onset   Diabetes Mother    Heart attack Mother    COPD Father    Pulmonary embolism Father    Hyperlipidemia Sister    Hyperlipidemia Daughter    Hyperlipidemia Brother    Kidney disease Brother    Kidney Stones Brother    Kidney cancer Neg Hx    Prostate cancer Neg Hx    Colon cancer Neg Hx    Colon polyps Neg Hx    Esophageal cancer Neg Hx    Rectal cancer Neg Hx    Stomach cancer Neg Hx    Breast cancer Neg Hx     Social History   Socioeconomic History   Marital status: Married    Spouse name: Not on file   Number of children: Not on file   Years of education: Not on file   Highest education level: Not on file  Occupational History   Not on file  Tobacco Use   Smoking status: Former    Packs/day: 0.50    Years: 20.00    Pack years: 10.00    Types: Cigarettes    Quit date: 08/03/1990    Years since quitting: 31.1   Smokeless tobacco: Never  Vaping Use   Vaping Use: Never used  Substance and Sexual Activity   Alcohol use: Yes    Alcohol/week: 0.0 standard drinks    Comment: 2 per month   Drug use: No   Sexual activity: Yes    Birth control/protection: Surgical  Other Topics Concern   Not on file  Social History Narrative   Not on file   Social Determinants of Health   Financial Resource Strain: Not on file  Food Insecurity: Not on file  Transportation Needs: Not on file  Physical Activity: Not on file  Stress: Not on file  Social Connections: Not on file  Intimate Partner Violence: Not on file    Review of Systems: See HPI, otherwise negative ROS  Physical Exam: BP (!) 131/59    Pulse 63    Temp 97.8 F (36.6 C) (Temporal)    Resp 18    Ht 5\' 7"  (1.702 m)    Wt 60.3 kg    SpO2 98%    BMI 20.83 kg/m  General:   Alert,  cooperative in NAD Head:  Normocephalic and atraumatic. Respiratory:  Normal work of breathing. Cardiovascular:  RRR  Impression/Plan: Tracey Wolf is here for cataract surgery.  Risks, benefits, limitations, and alternatives regarding cataract surgery have been reviewed with the patient.  Questions have been answered.  All parties agreeable.   Benay Pillow, MD  09/29/2021, 11:23 AM

## 2021-09-29 NOTE — Anesthesia Preprocedure Evaluation (Signed)
Anesthesia Evaluation  Patient identified by MRN, date of birth, ID band Patient awake    Reviewed: Allergy & Precautions, H&P , NPO status , Patient's Chart, lab work & pertinent test results, reviewed documented beta blocker date and time   Airway Mallampati: II  TM Distance: >3 FB Neck ROM: full    Dental no notable dental hx.    Pulmonary neg pulmonary ROS, former smoker,    Pulmonary exam normal breath sounds clear to auscultation       Cardiovascular Exercise Tolerance: Good negative cardio ROS   Rhythm:regular Rate:Normal  HLD   Neuro/Psych negative neurological ROS  negative psych ROS   GI/Hepatic negative GI ROS, Neg liver ROS,   Endo/Other  negative endocrine ROS  Renal/GU negative Renal ROS  negative genitourinary   Musculoskeletal   Abdominal   Peds  Hematology negative hematology ROS (+) Blood dyscrasia, anemia , B12 def   Anesthesia Other Findings   Reproductive/Obstetrics negative OB ROS                            Anesthesia Physical Anesthesia Plan  ASA: 2  Anesthesia Plan: MAC   Post-op Pain Management:    Induction:   PONV Risk Score and Plan: 2 and Midazolam, TIVA and Treatment may vary due to age or medical condition  Airway Management Planned:   Additional Equipment:   Intra-op Plan:   Post-operative Plan:   Informed Consent: I have reviewed the patients History and Physical, chart, labs and discussed the procedure including the risks, benefits and alternatives for the proposed anesthesia with the patient or authorized representative who has indicated his/her understanding and acceptance.     Dental Advisory Given  Plan Discussed with: CRNA  Anesthesia Plan Comments:         Anesthesia Quick Evaluation

## 2021-09-30 ENCOUNTER — Encounter: Payer: Self-pay | Admitting: Ophthalmology

## 2021-10-02 ENCOUNTER — Ambulatory Visit (INDEPENDENT_AMBULATORY_CARE_PROVIDER_SITE_OTHER): Payer: Medicare Other | Admitting: Family Medicine

## 2021-10-02 ENCOUNTER — Other Ambulatory Visit: Payer: Self-pay

## 2021-10-02 VITALS — BP 167/75 | HR 70 | Temp 97.9°F | Wt 131.0 lb

## 2021-10-02 DIAGNOSIS — Z780 Asymptomatic menopausal state: Secondary | ICD-10-CM

## 2021-10-02 DIAGNOSIS — E538 Deficiency of other specified B group vitamins: Secondary | ICD-10-CM | POA: Diagnosis not present

## 2021-10-02 DIAGNOSIS — R1032 Left lower quadrant pain: Secondary | ICD-10-CM | POA: Diagnosis not present

## 2021-10-02 DIAGNOSIS — H259 Unspecified age-related cataract: Secondary | ICD-10-CM | POA: Diagnosis not present

## 2021-10-02 DIAGNOSIS — E782 Mixed hyperlipidemia: Secondary | ICD-10-CM | POA: Diagnosis not present

## 2021-10-02 DIAGNOSIS — Z0000 Encounter for general adult medical examination without abnormal findings: Secondary | ICD-10-CM

## 2021-10-02 NOTE — Patient Instructions (Signed)
[place] at Pharmacy ? ?Decrease B12 injections to once weekly ?

## 2021-10-02 NOTE — Progress Notes (Signed)
Argentina Ponder De[place]anto,acting as a scribe for Wilhemena Durie, MD.,have documented all relevant documentation on the behalf of Wilhemena Durie, MD,as directed by  Wilhemena Durie, MD while in the presence of Wilhemena Durie, MD.    Complete physical exam   [name]   DOB: 09/12/1950   71 y.o. Female  MRN: [id number] Visit Date: 10/02/2021  Today's healthcare provider: Wilhemena Durie, MD   No chief complaint on file.  Subjective    Caitland FOTINI LEMUS is a 72 y.o. female who presents today for a complete physical exam.  She reports consuming a general diet. The patient has a physically strenuous job, but has no regular exercise apart from work.  She generally feels well. She reports sleeping well. She does not have additional problems to discuss today.   HPI  patient does have left flank left lower quadrant comfort that bothers her to sleep on that side.  Hurts with certain movements.  No GI or GU symptoms.  This been going on for several months now. Weight loss or systemic symptoms Past Medical History:  Diagnosis Date   B12 deficiency    Endometriosis 04/2015   Hyperlipidemia    Postmenopausal    Past Surgical History:  Procedure Laterality Date   ABDOMINAL HYSTERECTOMY     CATARACT EXTRACTION W/PHACO Left 09/29/2021   Procedure: CATARACT EXTRACTION PHACO AND INTRAOCULAR LENS PLACEMENT ([place]) LEFT PANOPTIX TORIC;  Surgeon: Eulogio Bear, MD;  Location: [facility];  Service: Ophthalmology;  Laterality: Left;  1.85 0:21.7   CERVICAL CONE BIOPSY     COLONOSCOPY     CYSTOSCOPY WITH BIOPSY N/A 05/05/2021   Procedure: CYSTOSCOPY WITH BLADDER BIOPSY;  Surgeon: Hollice Espy, MD;  Location: [facility];  Service: Urology;  Laterality: N/A;   DILATION AND CURETTAGE OF UTERUS     TUBAL LIGATION     Social History   Socioeconomic History   Marital status: Married    Spouse name: Not on file   Number of children: Not on file   Years of  education: Not on file   Highest education level: Not on file  Occupational History   Not on file  Tobacco Use   Smoking status: Former    Packs/day: 0.50    Years: 20.00    Pack years: 10.00    Types: Cigarettes    Quit date: 08/03/1990    Years since quitting: 31.1   Smokeless tobacco: Never  Vaping Use   Vaping Use: Never used  Substance and Sexual Activity   Alcohol use: Yes    Alcohol/week: 0.0 standard drinks    Comment: 2 per month   Drug use: No   Sexual activity: Yes    Birth control/protection: Surgical  Other Topics Concern   Not on file  Social History Narrative   Not on file   Social Determinants of Health   Financial Resource Strain: Not on file  Food Insecurity: Not on file  Transportation Needs: Not on file  Physical Activity: Not on file  Stress: Not on file  Social Connections: Not on file  Intimate Partner Violence: Not on file   Family Status  Relation Name Status   Mother  Deceased at age 53   Father  Deceased at age 42   Sister  Alive   Sister  Comptroller   Daughter  Alive   Brother  Alive   Neg Hx  (Not Specified)   Family History  Problem Relation  Age of Onset   Diabetes Mother    Heart attack Mother    COPD Father    Pulmonary embolism Father    Hyperlipidemia Sister    Hyperlipidemia Daughter    Hyperlipidemia Brother    Kidney disease Brother    Kidney Stones Brother    Kidney cancer Neg Hx    Prostate cancer Neg Hx    Colon cancer Neg Hx    Colon polyps Neg Hx    Esophageal cancer Neg Hx    Rectal cancer Neg Hx    Stomach cancer Neg Hx    Breast cancer Neg Hx    Allergies  Allergen Reactions   Bee Venom Anaphylaxis   Iodinated Contrast Media Anaphylaxis   Iohexol Anaphylaxis     Desc: had severe reaction in october '05   swelling, sob and throat closing    Cefaclor    Eggs Or Egg-Derived Products Nausea And Vomiting   Doxycycline Rash   Latex Rash    Patient Care Team: Jerrol Banana., MD as PCP - General  (Family Medicine)   Medications: Outpatient Medications Prior to Visit  Medication Sig   B-D 3CC LUER-LOK SYR 25GX5/8" 25G X 5/8" 3 ML MISC USE AS DIRECTED   BD DISP NEEDLES 25G X 5/8" MISC USE AS DIRECTED WITH B12 SHOTS   cyanocobalamin (,VITAMIN B-12,) 1000 MCG/ML injection INJECT 1 ML INTO THE SKIN EVERY 6 DAYS   EPINEPHrine 0.3 mg/0.3 mL IJ SOAJ injection INJECT INTRAMUSCULARLY AS DIRECTED   estradiol (VIVELLE-DOT) 0.05 MG/24HR patch APPLY 1 PATCH(0.05 MG) TOPICALLY TO THE SKIN 2 TIMES A WEEK   nitrofurantoin, macrocrystal-monohydrate, (MACROBID) 100 MG capsule Take 1 capsule (100 mg total) by mouth 2 (two) times daily. (Patient not taking: Reported on 09/22/2021)   Vitamin D, Ergocalciferol, (DRISDOL) 1.25 MG (50000 UNIT) CAPS capsule TAKE 1 CAPSULE BY MOUTH EVERY WEEK   Facility-Administered Medications Prior to Visit  Medication Dose Route Frequency Provider   0.9 %  sodium chloride infusion  500 mL Intravenous Once Irene Shipper, MD    Review of Systems  Constitutional: Negative.   HENT: Negative.    Eyes: Negative.   Respiratory: Negative.    Cardiovascular: Negative.   Gastrointestinal:  Positive for abdominal pain.  Endocrine: Negative.   Genitourinary:  Positive for flank pain.  Skin: Negative.   Allergic/Immunologic: Negative.   Neurological: Negative.   Hematological: Negative.   Psychiatric/Behavioral: Negative.        Objective    BP (!) 167/75 (BP Location: Left Arm, Patient Position: Sitting, Cuff Size: Normal)    Pulse 70    Temp 97.9 F (36.6 C) (Oral)    Wt 131 lb (59.4 kg)    SpO2 100%    BMI 20.52 kg/m  BP Readings from Last 3 Encounters:  10/02/21 (!) 167/75  09/29/21 (!) 117/53  09/04/21 126/74   Wt Readings from Last 3 Encounters:  10/02/21 131 lb (59.4 kg)  09/29/21 133 lb (60.3 kg)  09/04/21 131 lb (59.4 kg)       Physical Exam Constitutional:      Appearance: Normal appearance. She is normal weight.  HENT:     Head: Normocephalic and  atraumatic.     Right Ear: Tympanic membrane, ear canal and external ear normal.     Left Ear: Tympanic membrane, ear canal and external ear normal.     Nose: Nose normal.     Mouth/Throat:     Mouth: Mucous membranes are moist.  Pharynx: Oropharynx is clear.  Eyes:     Extraocular Movements: Extraocular movements intact.     Conjunctiva/sclera: Conjunctivae normal.     Pupils: Pupils are equal, round, and reactive to light.  Cardiovascular:     Rate and Rhythm: Normal rate and regular rhythm.     Pulses: Normal pulses.     Heart sounds: Normal heart sounds.  Pulmonary:     Effort: Pulmonary effort is normal.     Breath sounds: Normal breath sounds.  Abdominal:     General: Abdomen is flat. Bowel sounds are normal.     Palpations: Abdomen is soft.  Musculoskeletal:     Cervical back: Neck supple.  Skin:    General: Skin is warm and dry.  Neurological:     General: No focal deficit present.     Mental Status: She is alert and oriented to person, place, and time.  Psychiatric:        Mood and Affect: Mood normal.        Behavior: Behavior normal.        Thought Content: Thought content normal.        Judgment: Judgment normal.      Last depression screening scores PHQ 2/9 Scores 09/04/2021 07/20/2019 07/18/2018  PHQ - 2 Score 0 0 0  PHQ- 9 Score 0 0 -   Last fall risk screening Fall Risk  09/04/2021  Falls in the past year? 0  Number falls in past yr: 0  Injury with Fall? 0  Risk for fall due to : No Fall Risks  Follow up Falls evaluation completed   Last Audit-C alcohol use screening Alcohol Use Disorder Test (AUDIT) 09/04/2021  1. How often do you have a drink containing alcohol? 2  2. How many drinks containing alcohol do you have on a typical day when you are drinking? 0  3. How often do you have six or more drinks on one occasion? 0  AUDIT-C Score 2  Alcohol Brief Interventions/Follow-up -   A score of 3 or more in women, and 4 or more in men indicates  increased risk for alcohol abuse, EXCEPT if all of the points are from question 1   No results found for any visits on 10/02/21.  Assessment & Plan    Routine Health Maintenance and Physical Exam  Exercise Activities and Dietary recommendations  Goals   None     Immunization History  Administered Date(s) Administered   Moderna Sars-Covid-2 Vaccination 09/15/2019, 10/13/2019, 06/07/2020   Pneumococcal Conjugate-13 06/09/2017   Pneumococcal Polysaccharide-23 08/05/2018   Td 09/17/2004   Tdap 06/19/2015   Zoster, Live 05/03/2012    Health Maintenance  Topic Date Due   Zoster Vaccines- Shingrix (1 of 2) Never done   COVID-19 Vaccine (4 - Booster for Moderna series) 08/02/2020   MAMMOGRAM  03/15/2023   TETANUS/TDAP  06/18/2025   COLONOSCOPY (Pts 45-24yrs Insurance coverage will need to be confirmed)  08/26/2027   Pneumonia Vaccine 2+ Years old  Completed   DEXA SCAN  Completed   Hepatitis C Screening  Completed   HPV VACCINES  Aged Out   INFLUENZA VACCINE  Discontinued    Discussed health benefits of physical activity, and encouraged her to engage in regular exercise appropriate for her age and condition.  1. Annual physical exam Shingrix vaccine at pharmacy  2. LLQ pain Normal exam.  We will obtain ultrasound of the abdomen including pelvic ultrasound. Appears to be musculoskeletal pain - CBC with Differential/Platelet -  Comprehensive metabolic panel - TSH - US Abdomen Complete; Future  3. Mixed hyperlipidemia  - Lipid Panel With LDL/HDL Ratio  4. Vitamin B 12 deficiency Crease B12 dosing from every 6 days to once a week  5. Postmenopausal   6. Age-related cataract of both eyes, unspecified age-related cataract type Patient had left cataract removed 3 days ago.   No follow-ups on file.     I, Wilhemena Durie, MD, have reviewed all documentation for this visit. The documentation on 10/04/21 for the exam, diagnosis, procedures, and orders are all  accurate and complete.    Itha Kroeker Cranford Mon, MD  [facility] [phone] (phone) [phone] (fax)  [place]

## 2021-10-08 DIAGNOSIS — H2511 Age-related nuclear cataract, right eye: Secondary | ICD-10-CM | POA: Diagnosis not present

## 2021-10-08 NOTE — Discharge Instructions (Signed)

## 2021-10-13 ENCOUNTER — Encounter
Admission: RE | Disposition: A | Discharge status: Home / Self Care | Payer: Self-pay | Source: Ambulatory Visit | Attending: Ophthalmology

## 2021-10-13 ENCOUNTER — Ambulatory Visit: Payer: Medicare Other | Admitting: Anesthesiology

## 2021-10-13 ENCOUNTER — Encounter: Payer: Self-pay | Admitting: Ophthalmology

## 2021-10-13 ENCOUNTER — Ambulatory Visit
Admission: RE | Admit: 2021-10-13 | Discharge: 2021-10-13 | Disposition: A | Discharge status: Home / Self Care | Payer: Medicare Other | Source: Ambulatory Visit | Attending: Ophthalmology | Admitting: Ophthalmology

## 2021-10-13 DIAGNOSIS — H2511 Age-related nuclear cataract, right eye: Secondary | ICD-10-CM | POA: Insufficient documentation

## 2021-10-13 DIAGNOSIS — Z87891 Personal history of nicotine dependence: Secondary | ICD-10-CM | POA: Diagnosis not present

## 2021-10-13 DIAGNOSIS — H25811 Combined forms of age-related cataract, right eye: Secondary | ICD-10-CM | POA: Diagnosis not present

## 2021-10-13 SURGERY — PHACOEMULSIFICATION, CATARACT, WITH IOL INSERTION
Anesthesia: Monitor Anesthesia Care | Site: Eye | Laterality: Right

## 2021-10-13 MED ORDER — TETRACAINE HCL 0.5 % OP SOLN
1.0000 [drp] | OPHTHALMIC | Status: DC | PRN
Start: 2021-10-13 — End: 2021-10-13
  Administered 2021-10-13 (×3): 1 [drp] via OPHTHALMIC

## 2021-10-13 MED ORDER — MIDAZOLAM HCL 2 MG/2ML IJ SOLN
INTRAMUSCULAR | Status: DC | PRN
Start: 2021-10-13 — End: 2021-10-13
  Administered 2021-10-13: 2 mg via INTRAVENOUS

## 2021-10-13 MED ORDER — LACTATED RINGERS IV SOLN: INTRAVENOUS | Status: DC

## 2021-10-13 MED ORDER — SIGHTPATH DOSE#1 SODIUM HYALURONATE 23 MG/ML IO SOLUTION
PREFILLED_SYRINGE | INTRAOCULAR | Status: DC | PRN
  Administered 2021-10-13: 0.6 mL via INTRAOCULAR

## 2021-10-13 MED ORDER — LIDOCAINE HCL (PF) 2 % IJ SOLN
INTRAOCULAR | Status: DC | PRN
  Administered 2021-10-13: 1 mL via INTRAOCULAR

## 2021-10-13 MED ORDER — SIGHTPATH DOSE#1 SODIUM HYALURONATE 10 MG/ML IO SOLUTION
PREFILLED_SYRINGE | INTRAOCULAR | Status: DC | PRN
  Administered 2021-10-13: 0.85 mL via INTRAOCULAR

## 2021-10-13 MED ORDER — ARMC OPHTHALMIC DILATING DROPS
1.0000 "application " | OPHTHALMIC | Status: DC | PRN
  Administered 2021-10-13 (×3): 1 via OPHTHALMIC

## 2021-10-13 MED ORDER — SIGHTPATH DOSE#1 BSS IO SOLN
INTRAOCULAR | Status: DC | PRN
  Administered 2021-10-13: 15 mL

## 2021-10-13 MED ORDER — MOXIFLOXACIN HCL 0.5 % OP SOLN
OPHTHALMIC | Status: DC | PRN
  Administered 2021-10-13: 0.2 mL via OPHTHALMIC

## 2021-10-13 MED ORDER — FENTANYL CITRATE (PF) 100 MCG/2ML IJ SOLN
INTRAMUSCULAR | Status: DC | PRN
Start: 2021-10-13 — End: 2021-10-13
  Administered 2021-10-13: 50 ug via INTRAVENOUS

## 2021-10-13 MED ORDER — SIGHTPATH DOSE#1 BSS IO SOLN
INTRAOCULAR | Status: DC | PRN
  Administered 2021-10-13: 89 mL via OPHTHALMIC

## 2021-10-13 SURGICAL SUPPLY — 16 items
ACRYSOF IQ PANOPTIX TORIC 14.0 ×1 IMPLANT
CATARACT SUITE SIGHTPATH (MISCELLANEOUS) ×2 IMPLANT
DISSECTOR HYDRO NUCLEUS 50X22 (MISCELLANEOUS) ×2 IMPLANT
FEE CATARACT SUITE SIGHTPATH (MISCELLANEOUS) ×1 IMPLANT
GLOVE SURG GAMMEX PI TX LF 7.5 (GLOVE) ×2 IMPLANT
GLOVE SURG SYN 8.5  E (GLOVE) ×2
GLOVE SURG SYN 8.5 E (GLOVE) ×1 IMPLANT
GLOVE SURG SYN 8.5 PF PI (GLOVE) ×1 IMPLANT
LENS IOL IQ PAN TRC 30 14.0 IMPLANT
LENS IOL IQ PANOPTIX TORIC 14 ×1 IMPLANT
NDL FILTER BLUNT 18X1 1/2 (NEEDLE) ×1 IMPLANT
NEEDLE FILTER BLUNT 18X 1/2SAF (NEEDLE) ×1
NEEDLE FILTER BLUNT 18X1 1/2 (NEEDLE) ×1 IMPLANT
SYR 3ML LL SCALE MARK (SYRINGE) ×2 IMPLANT
SYR 5ML LL (SYRINGE) ×2 IMPLANT
WATER STERILE IRR 250ML POUR (IV SOLUTION) ×2 IMPLANT

## 2021-10-13 NOTE — Anesthesia Postprocedure Evaluation (Signed)
Anesthesia Post Note ? ?[name] ? ?Procedure(s) Performed: CATARACT EXTRACTION PHACO AND INTRAOCULAR LENS PLACEMENT ([facility]) RIGHT PANOPTIX TORIC 2.50 00:30.0 (Right: Eye) ? ? ?  ?Patient location during evaluation: PACU ?Anesthesia Type: MAC ?Level of consciousness: awake and alert ?Pain management: pain level controlled ?Vital Signs Assessment: post-procedure vital signs reviewed and stable ?Respiratory status: spontaneous breathing ?Cardiovascular status: blood pressure returned to baseline ?Postop Assessment: no apparent nausea or vomiting, adequate PO intake and no headache ?Anesthetic complications: no ? ? ?No notable events documented. ? ?Adele Barthel Sacha Radloff ? ? ? ? ? ?

## 2021-10-13 NOTE — Anesthesia Preprocedure Evaluation (Signed)
Anesthesia Evaluation  ?Patient identified by MRN, date of birth, ID band ?Patient awake ? ? ? ?History of Anesthesia Complications ?Negative for: history of anesthetic complications ? ?Airway ?Mallampati: II ? ?TM Distance: >3 FB ?Neck ROM: Full ? ? ? Dental ?no notable dental hx. ? ?  ?Pulmonary ?former smoker,  ?  ?Pulmonary exam normal ? ? ? ? ? ? ? Cardiovascular ?Exercise Tolerance: Good ?negative cardio ROS ?Normal cardiovascular exam ? ? ?  ?Neuro/Psych ?negative neurological ROS ?   ? GI/Hepatic ?negative GI ROS, Neg liver ROS,   ?Endo/Other  ?negative endocrine ROS ? Renal/GU ?negative Renal ROS  ? ?  ?Musculoskeletal ?negative musculoskeletal ROS ?(+)  ? Abdominal ?  ?Peds ? Hematology ?negative hematology ROS ?(+)   ?Anesthesia Other Findings ? ? Reproductive/Obstetrics ? ?  ? ? ? ? ? ? ? ? ? ? ? ? ? ?  ?  ? ? ? ? ? ? ? ?Anesthesia Physical ?Anesthesia Plan ? ?ASA: 1 ? ?Anesthesia Plan: MAC  ? ?Post-op Pain Management: Minimal or no pain anticipated  ? ?Induction:  ? ?PONV Risk Score and Plan:  ? ?Airway Management Planned: Natural Airway and Nasal Cannula ? ?Additional Equipment: None ? ?Intra-op Plan:  ? ?Post-operative Plan:  ? ?Informed Consent: I have reviewed the patients History and Physical, chart, labs and discussed the procedure including the risks, benefits and alternatives for the proposed anesthesia with the patient or authorized representative who has indicated his/her understanding and acceptance.  ? ? ? ? ? ?Plan Discussed with: CRNA ? ?Anesthesia Plan Comments:   ? ? ? ? ? ? ?Anesthesia Quick Evaluation ? ?

## 2021-10-13 NOTE — Transfer of Care (Signed)
Immediate Anesthesia Transfer of Care Note ? ?[name] ? ?Procedure(s) Performed: CATARACT EXTRACTION PHACO AND INTRAOCULAR LENS PLACEMENT ([facility]) RIGHT PANOPTIX TORIC 2.50 00:30.0 (Right: Eye) ? ?Patient Location: PACU ? ?Anesthesia Type: MAC ? ?Level of Consciousness: awake, alert  and patient cooperative ? ?Airway and Oxygen Therapy: Patient Spontanous Breathing and Patient connected to supplemental oxygen ? ?Post-op Assessment: Post-op Vital signs reviewed, Patient's Cardiovascular Status Stable, Respiratory Function Stable, Patent Airway and No signs of Nausea or vomiting ? ?Post-op Vital Signs: Reviewed and stable ? ?Complications: No notable events documented. ? ?

## 2021-10-13 NOTE — H&P (Signed)
[facility]  ? ?Primary Care Physician:  Jerrol Banana., MD ?Ophthalmologist: Dr. Benay Pillow ? ?Pre-Procedure History & Physical: ?HPI:  NAKIAH OSGOOD is a 71 y.o. female here for cataract surgery. ?  ?Past Medical History:  ?Diagnosis Date  ? B12 deficiency   ? Endometriosis 04/2015  ? Hyperlipidemia   ? Postmenopausal   ? ? ?Past Surgical History:  ?Procedure Laterality Date  ? ABDOMINAL HYSTERECTOMY    ? CATARACT EXTRACTION W/PHACO Left 09/29/2021  ? Procedure: CATARACT EXTRACTION PHACO AND INTRAOCULAR LENS PLACEMENT ([place]) LEFT PANOPTIX TORIC;  Surgeon: Eulogio Bear, MD;  Location: [facility];  Service: Ophthalmology;  Laterality: Left;  1.85 ?0:21.7  ? CERVICAL CONE BIOPSY    ? COLONOSCOPY    ? CYSTOSCOPY WITH BIOPSY N/A 05/05/2021  ? Procedure: CYSTOSCOPY WITH BLADDER BIOPSY;  Surgeon: Hollice Espy, MD;  Location: [facility];  Service: Urology;  Laterality: N/A;  ? DILATION AND CURETTAGE OF UTERUS    ? TUBAL LIGATION    ? ? ?Prior to Admission medications   ?Medication Sig Start Date End Date Taking? Authorizing Provider  ?cyanocobalamin (,VITAMIN B-12,) 1000 MCG/ML injection INJECT 1 ML INTO THE SKIN EVERY 6 DAYS 06/30/21  Yes Jerrol Banana., MD  ?estradiol (VIVELLE-DOT) 0.05 MG/24HR patch APPLY 1 PATCH(0.05 MG) TOPICALLY TO THE SKIN 2 TIMES A WEEK 02/27/21  Yes Harlin Heys, MD  ?Vitamin D, Ergocalciferol, (DRISDOL) 1.25 MG (50000 UNIT) CAPS capsule TAKE 1 CAPSULE BY MOUTH EVERY WEEK 06/03/21  Yes Jerrol Banana., MD  ?B-D 3CC LUER-LOK SYR 25GX5/8" 25G X 5/8" 3 ML MISC USE AS DIRECTED 08/19/20   Jerrol Banana., MD  ?BD DISP NEEDLES 25G X 5/8" MISC USE AS DIRECTED WITH B12 SHOTS 10/17/18   [provider]  ?EPINEPHrine 0.3 mg/0.3 mL IJ SOAJ injection INJECT INTRAMUSCULARLY AS DIRECTED 09/13/19   Jerrol Banana., MD  ?nitrofurantoin, macrocrystal-monohydrate, (MACROBID) 100 MG capsule Take 1 capsule (100 mg total) by mouth 2 (two) times  daily. ?Patient not taking: Reported on 09/22/2021 09/04/21   Jerrol Banana., MD  ? ? ?Allergies as of 09/01/2021 - Review Complete 06/12/2021  ?Allergen Reaction Noted  ? Bee venom Anaphylaxis 04/25/2015  ? Iodinated contrast media Anaphylaxis 04/25/2015  ? Iohexol Anaphylaxis 08/07/2004  ? Cefaclor  04/25/2015  ? Eggs or egg-derived products Nausea And Vomiting 04/22/2015  ? Doxycycline Rash 04/25/2015  ? Latex Rash 06/14/2017  ? ? ?Family History  ?Problem Relation Age of Onset  ? Diabetes Mother   ? Heart attack Mother   ? COPD Father   ? Pulmonary embolism Father   ? Hyperlipidemia Sister   ? Hyperlipidemia Daughter   ? Hyperlipidemia Brother   ? Kidney disease Brother   ? Kidney Stones Brother   ? Kidney cancer Neg Hx   ? Prostate cancer Neg Hx   ? Colon cancer Neg Hx   ? Colon polyps Neg Hx   ? Esophageal cancer Neg Hx   ? Rectal cancer Neg Hx   ? Stomach cancer Neg Hx   ? Breast cancer Neg Hx   ? ? ?Social History  ? ?Socioeconomic History  ? Marital status: Married  ?  Spouse name: Not on file  ? Number of children: Not on file  ? Years of education: Not on file  ? Highest education level: Not on file  ?Occupational History  ? Not on file  ?Tobacco Use  ? Smoking status:  Former  ?  Packs/day: 0.50  ?  Years: 20.00  ?  Pack years: 10.00  ?  Types: Cigarettes  ?  Quit date: 08/03/1990  ?  Years since quitting: 31.2  ? Smokeless tobacco: Never  ?Vaping Use  ? Vaping Use: Never used  ?Substance and Sexual Activity  ? Alcohol use: Yes  ?  Alcohol/week: 0.0 standard drinks  ?  Comment: 2 per month  ? Drug use: No  ? Sexual activity: Yes  ?  Birth control/protection: Surgical  ?Other Topics Concern  ? Not on file  ?Social History Narrative  ? Not on file  ? ?Social Determinants of Health  ? ?Financial Resource Strain: Not on file  ?Food Insecurity: Not on file  ?Transportation Needs: Not on file  ?Physical Activity: Not on file  ?Stress: Not on file  ?Social Connections: Not on file  ?Intimate Partner Violence:  Not on file  ? ? ?Review of Systems: ?See HPI, otherwise negative ROS ? ?Physical Exam: ?BP (!) 139/57   Pulse 69   Temp (!) 97.3 ?F (36.3 ?C) (Temporal)   Ht '5\' 7"'$  (1.702 m)   Wt 60.1 kg   SpO2 97%   BMI 20.77 kg/m?  ?General:   Alert, cooperative in NAD ?Head:  Normocephalic and atraumatic. ?Respiratory:  Normal work of breathing. ?Cardiovascular:  RRR ? ?Impression/Plan: ?[name] is here for cataract surgery. ? ?Risks, benefits, limitations, and alternatives regarding cataract surgery have been reviewed with the patient.  Questions have been answered.  All parties agreeable. ? ? ?Benay Pillow, MD  10/13/2021, 8:24 AM ? ? ?

## 2021-10-13 NOTE — Op Note (Signed)
OPERATIVE NOTE ? ?Tracey Wolf ?[id number] ?10/13/2021 ? ? ?PREOPERATIVE DIAGNOSIS:  Nuclear sclerotic cataract right eye.  H25.11 ?  ?POSTOPERATIVE DIAGNOSIS:    Nuclear sclerotic cataract right eye.   ?  ?PROCEDURE:  Phacoemusification with posterior chamber intraocular lens placement of the right eye  ? ?LENS:   ?Implant Name Type Inv. Item Serial No. Manufacturer Lot No. LRB No. Used Action  ?ACRYSOF IQ PANOPTIX TORIC 14.0 - [id number]  ACRYSOF IQ PANOPTIX TORIC 14.0 [id number] [employer]  Right 1 Implanted  ?    ? ?Procedure(s): ?CATARACT EXTRACTION PHACO AND INTRAOCULAR LENS PLACEMENT ([facility]) RIGHT PANOPTIX TORIC 2.50 00:30.0 (Right) ? ?TFNT30 +14.0 ?  ?ULTRASOUND TIME: 0 minutes 30 seconds.  CDE 2.50 ?  ?SURGEON:  Benay Pillow, MD, MPH ? ?ANESTHESIOLOGIST: Anesthesiologist: Page, Adele Barthel, MD ?CRNA: Silvana Newness, CRNA ?  ?ANESTHESIA:  Topical with tetracaine drops augmented with 1% preservative-free intracameral lidocaine. ? ?ESTIMATED BLOOD LOSS: less than 1 mL. ?  ?COMPLICATIONS:  None. ?  ?DESCRIPTION OF PROCEDURE:  The patient was identified in the holding room and transported to the operating room and placed in the supine position under the operating microscope.  The right eye was identified as the operative eye and it was prepped and draped in the usual sterile ophthalmic fashion. ? ?The verion system was registered without difficulty. ?  ?A 1.0 millimeter clear-corneal paracentesis was made at the 10:30 position. 0.5 ml of preservative-free 1% lidocaine with epinephrine was injected into the anterior chamber. ? The anterior chamber was filled with Healon 5 viscoelastic.  A 2.4 millimeter keratome was used to make a near-clear corneal incision at the 8:00 position.  A curvilinear capsulorrhexis was made with a cystotome and capsulorrhexis forceps.  Balanced salt solution was used to hydrodissect and hydrodelineate the nucleus. ?  ?Phacoemulsification was then used in stop and chop fashion to remove  the lens nucleus and epinucleus.  The remaining cortex was then removed using the irrigation and aspiration handpiece. Healon was then placed into the capsular bag to distend it for lens placement.  A lens was then injected into the capsular bag.  The remaining viscoelastic was aspirated. ? ?The lens was rotated  to 179 degrees with guidance from the [place] system. ?  ?Wounds were hydrated with balanced salt solution.  The anterior chamber was inflated to a physiologic pressure with balanced salt solution. The lens was well centered and aligned. ? ?Intracameral vigamox 0.1 mL undiluted was injected into the eye and a drop placed onto the ocular surface. ? ?No wound leaks were noted.  The patient was taken to the recovery room in stable condition without complications of anesthesia or surgery ? ?Benay Pillow ?10/13/2021, 8:53 AM ? ?

## 2021-10-14 ENCOUNTER — Encounter: Payer: Self-pay | Admitting: Ophthalmology

## 2021-11-10 ENCOUNTER — Ambulatory Visit (INDEPENDENT_AMBULATORY_CARE_PROVIDER_SITE_OTHER): Payer: Medicare Other

## 2021-11-10 VITALS — Wt 132.0 lb

## 2021-11-10 DIAGNOSIS — Z0000 Encounter for general adult medical examination without abnormal findings: Secondary | ICD-10-CM

## 2021-11-10 NOTE — Patient Instructions (Addendum)
Tracey Wolf , ?Thank you for taking time to come for your Medicare Wellness Visit. I appreciate your ongoing commitment to your health goals. Please review the following plan we discussed and let me know if I can assist you in the future.  ? ?Screening recommendations/referrals: ?Colonoscopy: 08/25/17 ?Mammogram: 03/14/21 ?Bone Density: 05/26/16 ?Recommended yearly ophthalmology/optometry visit for glaucoma screening and checkup ?Recommended yearly dental visit for hygiene and checkup ? ?Vaccinations: ?Influenza vaccine: allergy to eggs ?Pneumococcal vaccine: 08/05/18 ?Tdap vaccine: 06/19/15 ?Shingles vaccine: Zostavax 05/03/12   ?Covid-19:09/15/19, 10/13/19, 06/07/20 ? ?Advanced directives: yes, copy requested ? ?Conditions/risks identified: none ? ?Next appointment: Follow up in one year for your annual wellness visit - 11/12/22 @ 8:15am by phone ? ? ?Preventive Care 49 Years and Older, Female ?Preventive care refers to lifestyle choices and visits with your health care provider that can promote health and wellness. ?What does preventive care include? ?A yearly physical exam. This is also called an annual well check. ?Dental exams once or twice a year. ?Routine eye exams. Ask your health care provider how often you should have your eyes checked. ?Personal lifestyle choices, including: ?Daily care of your teeth and gums. ?Regular physical activity. ?Eating a healthy diet. ?Avoiding tobacco and drug use. ?Limiting alcohol use. ?Practicing safe sex. ?Taking low-dose aspirin every day. ?Taking vitamin and mineral supplements as recommended by your health care provider. ?What happens during an annual well check? ?The services and screenings done by your health care provider during your annual well check will depend on your age, overall health, lifestyle risk factors, and family history of disease. ?Counseling  ?Your health care provider may ask you questions about your: ?Alcohol use. ?Tobacco use. ?Drug use. ?Emotional  well-being. ?Home and relationship well-being. ?Sexual activity. ?Eating habits. ?History of falls. ?Memory and ability to understand (cognition). ?Work and work Statistician. ?Reproductive health. ?Screening  ?You may have the following tests or measurements: ?Height, weight, and BMI. ?Blood pressure. ?Lipid and cholesterol levels. These may be checked every 5 years, or more frequently if you are over 2 years old. ?Skin check. ?Lung cancer screening. You may have this screening every year starting at age 63 if you have a 30-pack-year history of smoking and currently smoke or have quit within the past 15 years. ?Fecal occult blood test (FOBT) of the stool. You may have this test every year starting at age 29. ?Flexible sigmoidoscopy or colonoscopy. You may have a sigmoidoscopy every 5 years or a colonoscopy every 10 years starting at age 58. ?Hepatitis C blood test. ?Hepatitis B blood test. ?Sexually transmitted disease (STD) testing. ?Diabetes screening. This is done by checking your blood sugar (glucose) after you have not eaten for a while (fasting). You may have this done every 1-3 years. ?Bone density scan. This is done to screen for osteoporosis. You may have this done starting at age 12. ?Mammogram. This may be done every 1-2 years. Talk to your health care provider about how often you should have regular mammograms. ?Talk with your health care provider about your test results, treatment options, and if necessary, the need for more tests. ?Vaccines  ?Your health care provider may recommend certain vaccines, such as: ?Influenza vaccine. This is recommended every year. ?Tetanus, diphtheria, and acellular pertussis (Tdap, Td) vaccine. You may need a Td booster every 10 years. ?Zoster vaccine. You may need this after age 43. ?Pneumococcal 13-valent conjugate (PCV13) vaccine. One dose is recommended after age 38. ?Pneumococcal polysaccharide (PPSV23) vaccine. One dose is recommended after age 66. ?  Talk to your  health care provider about which screenings and vaccines you need and how often you need them. ?This information is not intended to replace advice given to you by your health care provider. Make sure you discuss any questions you have with your health care provider. ?Document Released: 08/16/2015 Document Revised: 04/08/2016 Document Reviewed: 05/21/2015 ?Elsevier Interactive Patient Education ? 2017 [place]. ? ?Fall Prevention in the Home ?Falls can cause injuries. They can happen to people of all ages. There are many things you can do to make your home safe and to help prevent falls. ?What can I do on the outside of my home? ?Regularly fix the edges of walkways and driveways and fix any cracks. ?Remove anything that might make you trip as you walk through a door, such as a raised step or threshold. ?Trim any bushes or trees on the path to your home. ?Use bright outdoor lighting. ?Clear any walking paths of anything that might make someone trip, such as rocks or tools. ?Regularly check to see if handrails are loose or broken. Make sure that both sides of any steps have handrails. ?Any raised decks and porches should have guardrails on the edges. ?Have any leaves, snow, or ice cleared regularly. ?Use sand or salt on walking paths during winter. ?Clean up any spills in your garage right away. This includes oil or grease spills. ?What can I do in the bathroom? ?Use night lights. ?Install grab bars by the toilet and in the tub and shower. Do not use towel bars as grab bars. ?Use non-skid mats or decals in the tub or shower. ?If you need to sit down in the shower, use a plastic, non-slip stool. ?Keep the floor dry. Clean up any water that spills on the floor as soon as it happens. ?Remove soap buildup in the tub or shower regularly. ?Attach bath mats securely with double-sided non-slip rug tape. ?Do not have throw rugs and other things on the floor that can make you trip. ?What can I do in the bedroom? ?Use night  lights. ?Make sure that you have a light by your bed that is easy to reach. ?Do not use any sheets or blankets that are too big for your bed. They should not hang down onto the floor. ?Have a firm chair that has side arms. You can use this for support while you get dressed. ?Do not have throw rugs and other things on the floor that can make you trip. ?What can I do in the kitchen? ?Clean up any spills right away. ?Avoid walking on wet floors. ?Keep items that you use a lot in easy-to-reach places. ?If you need to reach something above you, use a strong step stool that has a grab bar. ?Keep electrical cords out of the way. ?Do not use floor polish or wax that makes floors slippery. If you must use wax, use non-skid floor wax. ?Do not have throw rugs and other things on the floor that can make you trip. ?What can I do with my stairs? ?Do not leave any items on the stairs. ?Make sure that there are handrails on both sides of the stairs and use them. Fix handrails that are broken or loose. Make sure that handrails are as long as the stairways. ?Check any carpeting to make sure that it is firmly attached to the stairs. Fix any carpet that is loose or worn. ?Avoid having throw rugs at the top or bottom of the stairs. If you do have throw  rugs, attach them to the floor with carpet tape. ?Make sure that you have a light switch at the top of the stairs and the bottom of the stairs. If you do not have them, ask someone to add them for you. ?What else can I do to help prevent falls? ?Wear shoes that: ?Do not have high heels. ?Have rubber bottoms. ?Are comfortable and fit you well. ?Are closed at the toe. Do not wear sandals. ?If you use a stepladder: ?Make sure that it is fully opened. Do not climb a closed stepladder. ?Make sure that both sides of the stepladder are locked into place. ?Ask someone to hold it for you, if possible. ?Clearly mark and make sure that you can see: ?Any grab bars or handrails. ?First and last  steps. ?Where the edge of each step is. ?Use tools that help you move around (mobility aids) if they are needed. These include: ?Canes. ?Walkers. ?Scooters. ?Crutches. ?Turn on the lights when you go into a dark area.

## 2021-11-10 NOTE — Progress Notes (Signed)
Virtual Visit via Telephone Note  I connected with  Tracey Wolf on 11/10/21 at  8:15 AM EDT by telephone and verified that I am speaking with the correct person using two identifiers.  Location: Patient: home Provider: [facility] Persons participating in the virtual visit: [place]   I discussed the limitations, risks, security and privacy concerns of performing an evaluation and management service by telephone and the availability of in person appointments. The patient expressed understanding and agreed to proceed.  Interactive audio and video telecommunications were attempted between this nurse and patient, however failed, due to patient having technical difficulties OR patient did not have access to video capability.  We continued and completed visit with audio only.  Some vital signs may be absent or patient reported.   Tracey David, LPN  Subjective:   Tracey Wolf is a 71 y.o. female who presents for Medicare Annual (Subsequent) preventive examination.  Review of Systems           Objective:    There were no vitals filed for this visit. There is no height or weight on file to calculate BMI.     10/13/2021    7:40 AM 09/29/2021   10:43 AM 05/05/2021    1:14 PM 04/28/2021   10:04 AM 08/11/2017    2:28 PM 11/18/2015    4:27 PM 06/19/2015   10:02 AM  Advanced Directives  Does Patient Have a Medical Advance Directive? Yes Yes Yes Yes No No No  Type of Paramedic of [place];Living will [place];Living will [place];Living will Living will;Healthcare Power of Attorney     Does patient want to make changes to medical advance directive? No - Patient declined No - Patient declined No - Patient declined No - Patient declined     Copy of [place] in Chart? No - copy requested No - copy requested No - copy requested No - copy requested       Current Medications  (verified) Outpatient Encounter Medications as of 11/10/2021  Medication Sig   B-D 3CC LUER-LOK SYR 25GX5/8" 25G X 5/8" 3 ML MISC USE AS DIRECTED   BD DISP NEEDLES 25G X 5/8" MISC USE AS DIRECTED WITH B12 SHOTS   cyanocobalamin (,VITAMIN B-12,) 1000 MCG/ML injection INJECT 1 ML INTO THE SKIN EVERY 6 DAYS   EPINEPHrine 0.3 mg/0.3 mL IJ SOAJ injection INJECT INTRAMUSCULARLY AS DIRECTED   estradiol (VIVELLE-DOT) 0.05 MG/24HR patch APPLY 1 PATCH(0.05 MG) TOPICALLY TO THE SKIN 2 TIMES A WEEK   nitrofurantoin, macrocrystal-monohydrate, (MACROBID) 100 MG capsule Take 1 capsule (100 mg total) by mouth 2 (two) times daily. (Patient not taking: Reported on 09/22/2021)   VIACTIV CALCIUM PLUS D 650-12.5-40 MG-MCG CHEW    Vitamin D, Ergocalciferol, (DRISDOL) 1.25 MG (50000 UNIT) CAPS capsule TAKE 1 CAPSULE BY MOUTH EVERY WEEK   Facility-Administered Encounter Medications as of 11/10/2021  Medication   0.9 %  sodium chloride infusion    Allergies (verified) Bee venom, Iodinated contrast media, Iohexol, Cefaclor, Eggs or egg-derived products, Doxycycline, and Latex   History: Past Medical History:  Diagnosis Date   B12 deficiency    Endometriosis 04/2015   Hyperlipidemia    Postmenopausal    Past Surgical History:  Procedure Laterality Date   ABDOMINAL HYSTERECTOMY     CATARACT EXTRACTION W/PHACO Left 09/29/2021   Procedure: CATARACT EXTRACTION PHACO AND INTRAOCULAR LENS PLACEMENT ([place]) LEFT PANOPTIX TORIC;  Surgeon: Eulogio Bear, MD;  Location: [facility];  Service: Ophthalmology;  Laterality: Left;  1.85 0:21.7   CATARACT EXTRACTION W/PHACO Right 10/13/2021   Procedure: CATARACT EXTRACTION PHACO AND INTRAOCULAR LENS PLACEMENT ([facility]) RIGHT PANOPTIX TORIC 2.50 00:30.0;  Surgeon: Eulogio Bear, MD;  Location: [place];  Service: Ophthalmology;  Laterality: Right;   CERVICAL CONE BIOPSY     COLONOSCOPY     CYSTOSCOPY WITH BIOPSY N/A 05/05/2021   Procedure: CYSTOSCOPY  WITH BLADDER BIOPSY;  Surgeon: Hollice Espy, MD;  Location: [facility];  Service: Urology;  Laterality: N/A;   DILATION AND CURETTAGE OF UTERUS     TUBAL LIGATION     Family History  Problem Relation Age of Onset   Diabetes Mother    Heart attack Mother    COPD Father    Pulmonary embolism Father    Hyperlipidemia Sister    Hyperlipidemia Daughter    Hyperlipidemia Brother    Kidney disease Brother    Kidney Stones Brother    Kidney cancer Neg Hx    Prostate cancer Neg Hx    Colon cancer Neg Hx    Colon polyps Neg Hx    Esophageal cancer Neg Hx    Rectal cancer Neg Hx    Stomach cancer Neg Hx    Breast cancer Neg Hx    Social History   Socioeconomic History   Marital status: Married    Spouse name: Not on file   Number of children: Not on file   Years of education: Not on file   Highest education level: Not on file  Occupational History   Not on file  Tobacco Use   Smoking status: Former    Packs/day: 0.50    Years: 20.00    Pack years: 10.00    Types: Cigarettes    Quit date: 08/03/1990    Years since quitting: 31.2   Smokeless tobacco: Never  Vaping Use   Vaping Use: Never used  Substance and Sexual Activity   Alcohol use: Yes    Alcohol/week: 0.0 standard drinks    Comment: 2 per month   Drug use: No   Sexual activity: Yes    Birth control/protection: Surgical  Other Topics Concern   Not on file  Social History Narrative   Not on file   Social Determinants of Health   Financial Resource Strain: Not on file  Food Insecurity: Not on file  Transportation Needs: Not on file  Physical Activity: Not on file  Stress: Not on file  Social Connections: Not on file    Tobacco Counseling Counseling given: Not Answered   Clinical Intake:  Pre-visit preparation completed: Yes        Nutritional Risks: None Diabetes: No  How often do you need to have someone help you when you read instructions, pamphlets, or other written materials from your doctor  or pharmacy?: 1 - Never  Diabetic?no  Interpreter Needed?: No  Information entered by :: Kirke Shaggy, LPN   Activities of Daily Living    11/09/2021   11:09 AM 10/13/2021    7:33 AM  In your present state of health, do you have any difficulty performing the following activities:  Hearing? 0 0  Vision? 0 0  Difficulty concentrating or making decisions? 0 0  Walking or climbing stairs? 0 0  Dressing or bathing? 0 0  Doing errands, shopping? 0   Preparing Food and eating ? N   Using the Toilet? N   In the past six months, have  you accidently leaked urine? N   Do you have problems with loss of bowel control? N   Managing your Medications? N   Managing your Finances? N   Housekeeping or managing your Housekeeping? N     Patient Care Team: Jerrol Banana., MD as PCP - General (Family Medicine)  Indicate any recent Medical Services you may have received from other than [facility] providers in the past year (date may be approximate).     Assessment:   This is a routine wellness examination for Henny.  Hearing/Vision screen No results found.  Dietary issues and exercise activities discussed:     Goals Addressed   None    Depression Screen    09/04/2021    8:42 AM 07/20/2019    2:27 PM 07/18/2018    2:21 PM 06/09/2017    9:52 AM 06/09/2017    9:51 AM 01/25/2017   10:29 AM 01/25/2017   10:28 AM  PHQ 2/9 Scores  PHQ - 2 Score 0 0 0 0 0 0 0  PHQ- 9 Score 0 0  0  0     Fall Risk    11/09/2021   11:09 AM 09/04/2021    8:42 AM 07/20/2019    2:32 PM 06/09/2017    9:51 AM 01/25/2017   10:28 AM  Fall Risk   Falls in the past year? 0 0 0 No No  Number falls in past yr:  0 0    Injury with Fall?  0 0    Risk for fall due to :  No Fall Risks     Follow up  Falls evaluation completed Falls evaluation completed      [place]:  Any stairs in or around the home? Yes  If so, are there any without handrails? No  Home free of loose throw  rugs in walkways, pet beds, electrical cords, etc? Yes  Adequate lighting in your home to reduce risk of falls? Yes   ASSISTIVE DEVICES UTILIZED TO PREVENT FALLS:  Life alert? No  Use of a cane, walker or w/c? No  Grab bars in the bathroom? Yes  Shower chair or bench in shower? No  Elevated toilet seat or a handicapped toilet? Yes   Cognitive Function:        Immunizations Immunization History  Administered Date(s) Administered   Moderna Sars-Covid-2 Vaccination 09/15/2019, 10/13/2019, 06/07/2020   Pneumococcal Conjugate-13 06/09/2017   Pneumococcal Polysaccharide-23 08/05/2018   Td 09/17/2004   Tdap 06/19/2015   Zoster, Live 05/03/2012    TDAP status: Up to date  Flu Vaccine status: Declined, Education has been provided regarding the importance of this vaccine but patient still declined. Advised may receive this vaccine at local pharmacy or Health Dept. Aware to provide a copy of the vaccination record if obtained from local pharmacy or Health Dept. Verbalized acceptance and understanding.  Pneumococcal vaccine status: Up to date  Covid-19 vaccine status: Completed vaccines  Qualifies for Shingles Vaccine? Yes   Zostavax completed Yes   Shingrix Completed?: No.    Education has been provided regarding the importance of this vaccine. Patient has been advised to call insurance company to determine out of pocket expense if they have not yet received this vaccine. Advised may also receive vaccine at local pharmacy or Health Dept. Verbalized acceptance and understanding.  Screening Tests Health Maintenance  Topic Date Due   Zoster Vaccines- Shingrix (1 of 2) Never done   COVID-19 Vaccine (4 -  Booster for Moderna series) 08/02/2020   MAMMOGRAM  03/15/2023   TETANUS/TDAP  06/18/2025   COLONOSCOPY (Pts 45-37yr Insurance coverage will need to be confirmed)  08/26/2027   Pneumonia Vaccine 71 Years old  Completed   DEXA SCAN  Completed   Hepatitis C Screening  Completed    HPV VACCINES  Aged Out   INFLUENZA VACCINE  Discontinued    Health Maintenance  Health Maintenance Due  Topic Date Due   Zoster Vaccines- Shingrix (1 of 2) Never done   COVID-19 Vaccine (4 - Booster for Moderna series) 08/02/2020    Colorectal cancer screening: Type of screening: Colonoscopy. Completed 08/25/17. Repeat every 10 years  Mammogram status: Completed 03/14/21. Repeat every year  Bone Density status: Completed 05/26/16. Results reflect: Bone density results: OSTEOPENIA. Repeat every 5 years.  Lung Cancer Screening: (Low Dose CT Chest recommended if Age 71-80years, 30 pack-year currently smoking OR have quit w/in 15years.) does not qualify.   Additional Screening:  Hepatitis C Screening: does qualify; Completed 09/14/18  Vision Screening: Recommended annual ophthalmology exams for early detection of glaucoma and other disorders of the eye. Is the patient up to date with their annual eye exam?  Yes  Who is the provider or what is the name of the office in which the patient attends annual eye exams? Dr.King If pt is not established with a provider, would they like to be referred to a provider to establish care? No .   Dental Screening: Recommended annual dental exams for proper oral hygiene  Community Resource Referral / Chronic Care Management: CRR required this visit?  No   CCM required this visit?  No      Plan:     I have personally reviewed and noted the following in the patient's chart:   Medical and social history Use of alcohol, tobacco or illicit drugs  Current medications and supplements including opioid prescriptions.  Functional ability and status Nutritional status Physical activity Advanced directives List of other physicians Hospitalizations, surgeries, and ER visits in previous 12 months Vitals Screenings to include cognitive, depression, and falls Referrals and appointments  In addition, I have reviewed and discussed with patient certain  preventive protocols, quality metrics, and best practice recommendations. A written personalized care plan for preventive services as well as general preventive health recommendations were provided to patient.     LDionisio David LPN   [id number]/34/[id number]  Nurse Notes: none

## 2021-11-11 ENCOUNTER — Other Ambulatory Visit: Payer: Self-pay | Admitting: *Deleted

## 2021-11-11 ENCOUNTER — Telehealth: Payer: Self-pay

## 2021-11-11 DIAGNOSIS — R1032 Left lower quadrant pain: Secondary | ICD-10-CM

## 2021-11-11 NOTE — Telephone Encounter (Signed)
Re ordered

## 2021-11-11 NOTE — Telephone Encounter (Signed)
Copied from [place] [phone]. Topic: General - Other ?>> Nov 11, 2021 11:40 AM Parke Poisson wrote: ?Reason for CRM: Per [facility] for LLQ pain test is usually pelvic ultrasound complete with transvaginal. If you want to do the abdominal ultrasound they will need a new diagnosis entered such as abdominal pain,Thanks ?

## 2021-11-11 NOTE — Progress Notes (Unsigned)
Re ordered

## 2021-11-12 ENCOUNTER — Ambulatory Visit
Admission: RE | Admit: 2021-11-12 | Discharge: 2021-11-12 | Disposition: A | Discharge status: Home / Self Care | Payer: Medicare Other | Source: Ambulatory Visit | Attending: Family Medicine | Admitting: Family Medicine

## 2021-11-12 ENCOUNTER — Telehealth: Payer: Self-pay

## 2021-11-12 DIAGNOSIS — R1032 Left lower quadrant pain: Secondary | ICD-10-CM | POA: Insufficient documentation

## 2021-11-12 NOTE — Telephone Encounter (Signed)
Spoke with patient regarding prior auth for Estradiol patch. Patient states she has been using a coupon card through the pharmacy and will continue to do so.  ?

## 2021-11-15 ENCOUNTER — Other Ambulatory Visit: Payer: Self-pay | Admitting: Family Medicine

## 2021-12-11 IMAGING — MG MM DIGITAL SCREENING BILAT W/ TOMO AND CAD
8 series · 9 of 24 positions shown · non-contrast
Comparison: Previous exam(s).

CLINICAL DATA: Screening.

EXAM:
DIGITAL SCREENING BILATERAL MAMMOGRAM WITH TOMOSYNTHESIS AND CAD
TECHNIQUE: Bilateral screening digital craniocaudal and mediolateral oblique
mammograms were obtained. Bilateral screening digital breast
tomosynthesis was performed. The images were evaluated with
computer-aided detection.

[L CC synth-2D]
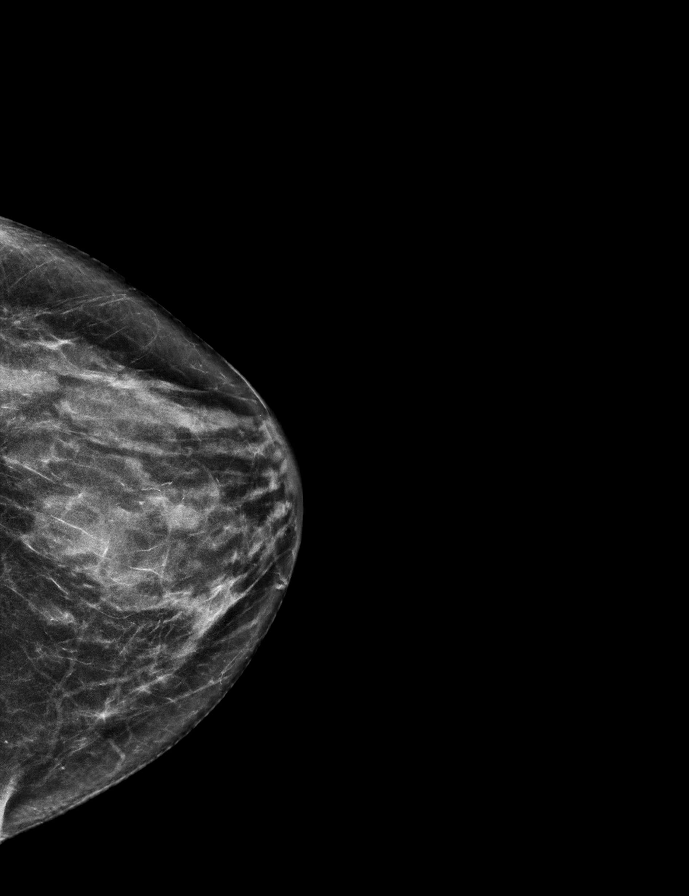

[L MLO synth-2D]
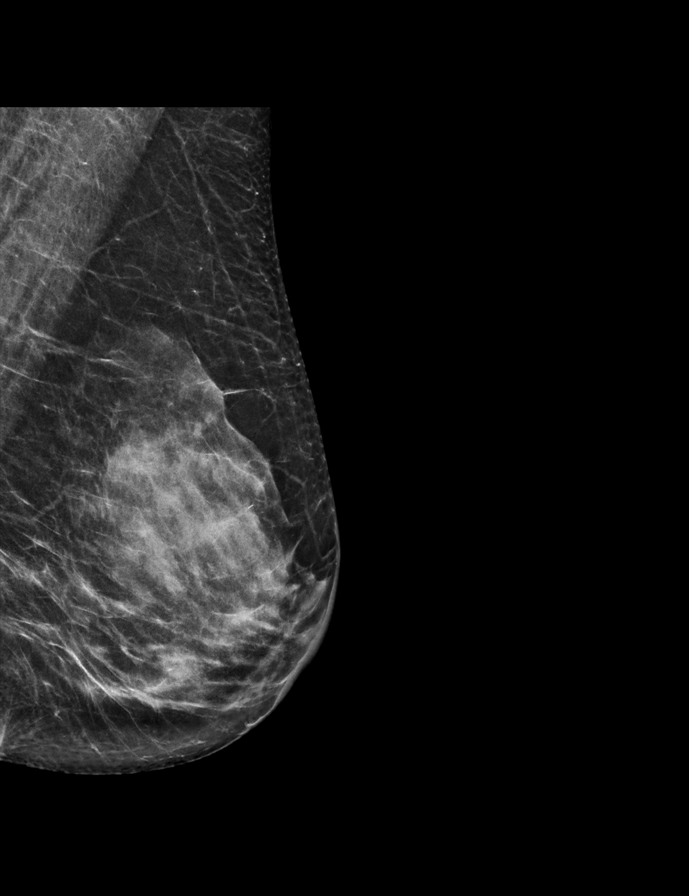

[R MLO synth-2D]
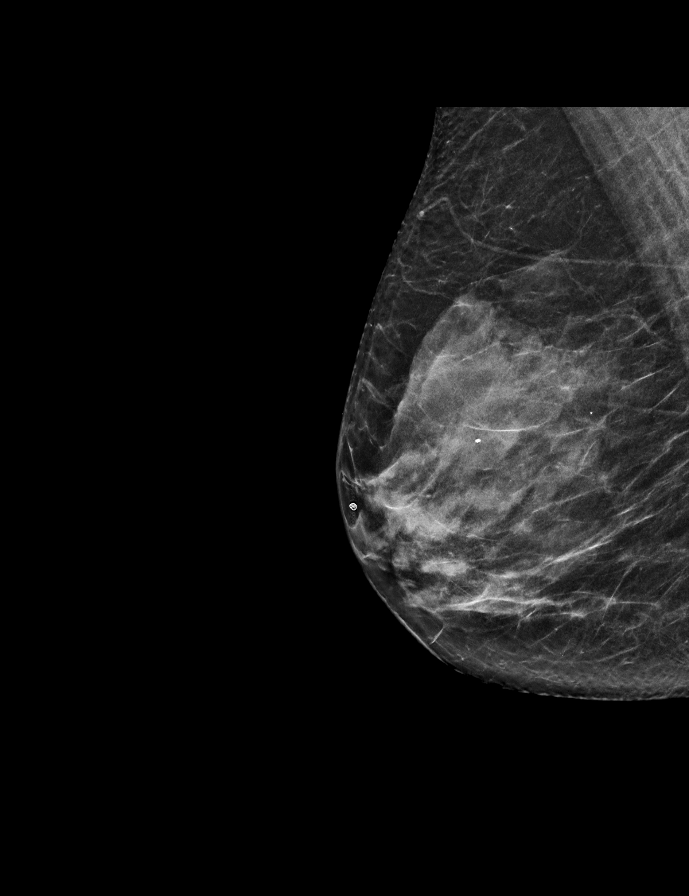

[R CC synth-2D]
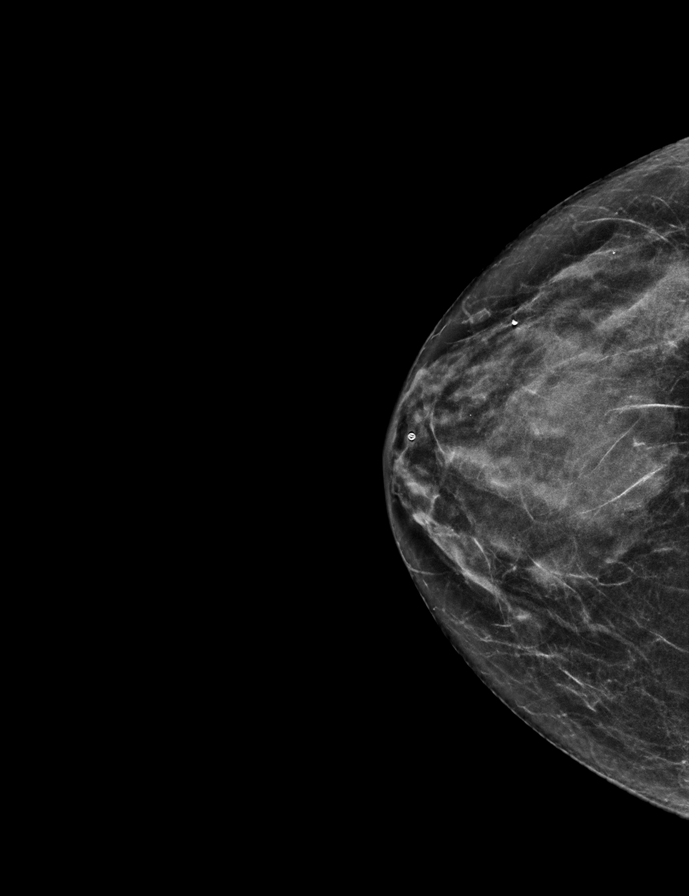

[R MLO tomo · 2 of 55 frames shown]
[frame 18/55]
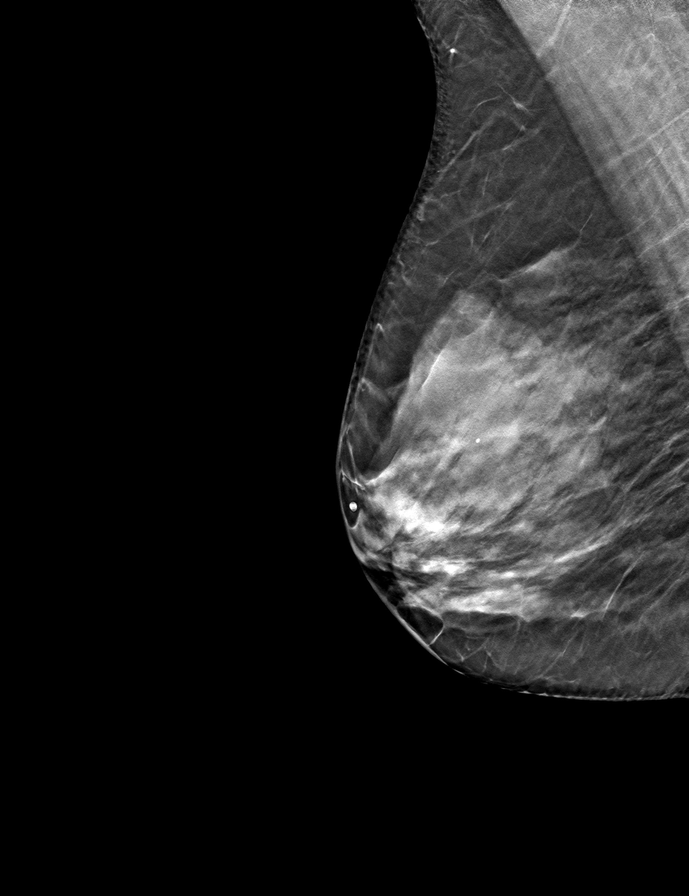
[frame 28/55]
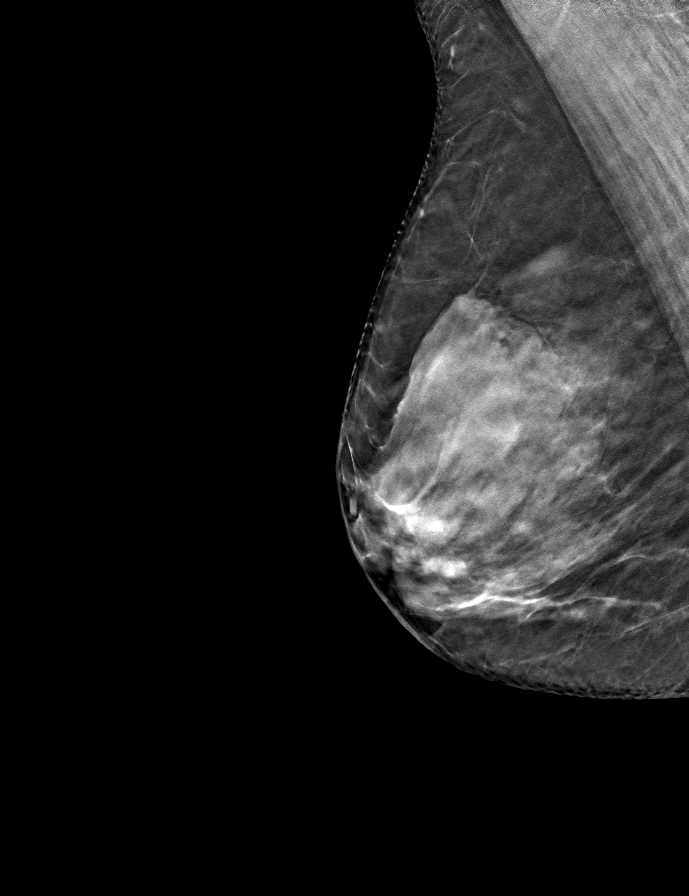

[L MLO tomo · tomo slice 31/60.0]
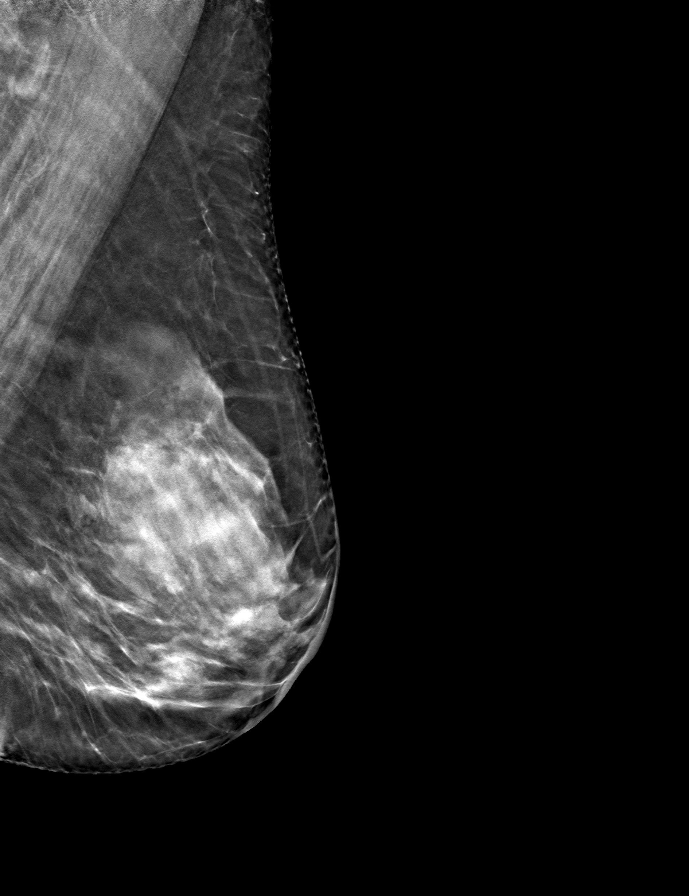

[L CC tomo · tomo slice 29/57.0]
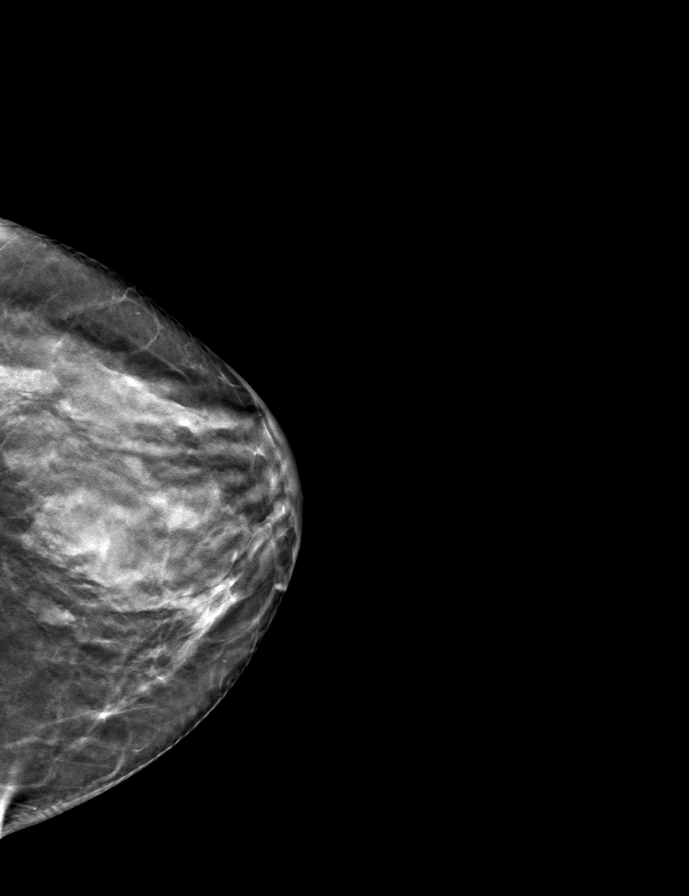

[R CC tomo · tomo slice 27/52.0]
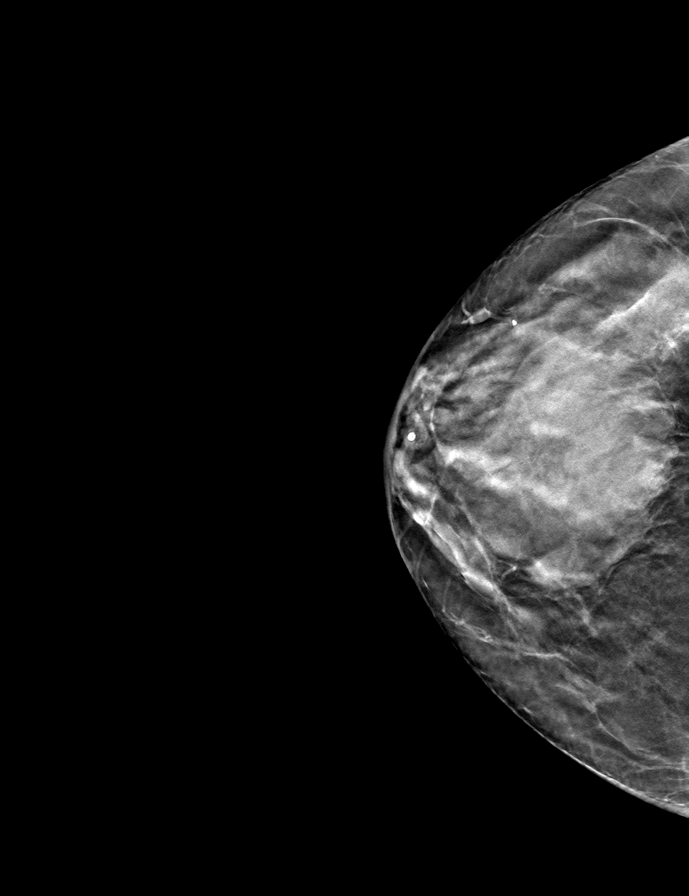

[9 of 24 positions shown; findings below may reference images not displayed]

ACR Breast Density Category d: The breast tissue is extremely dense,
which lowers the sensitivity of mammography
FINDINGS: There are no findings suspicious for malignancy.
IMPRESSION: No mammographic evidence of malignancy. A result letter of this
screening mammogram will be mailed directly to the patient.

RECOMMENDATION:
Screening mammogram in one year. (Code:TA-[id number])

BI-RADS CATEGORY  1: Negative.

## 2022-01-08 DIAGNOSIS — D2262 Melanocytic nevi of left upper limb, including shoulder: Secondary | ICD-10-CM | POA: Diagnosis not present

## 2022-01-08 DIAGNOSIS — D2261 Melanocytic nevi of right upper limb, including shoulder: Secondary | ICD-10-CM | POA: Diagnosis not present

## 2022-01-08 DIAGNOSIS — D2272 Melanocytic nevi of left lower limb, including hip: Secondary | ICD-10-CM | POA: Diagnosis not present

## 2022-01-08 DIAGNOSIS — D225 Melanocytic nevi of trunk: Secondary | ICD-10-CM | POA: Diagnosis not present

## 2022-01-08 DIAGNOSIS — D2271 Melanocytic nevi of right lower limb, including hip: Secondary | ICD-10-CM | POA: Diagnosis not present

## 2022-01-08 DIAGNOSIS — L821 Other seborrheic keratosis: Secondary | ICD-10-CM | POA: Diagnosis not present

## 2022-02-10 ENCOUNTER — Other Ambulatory Visit: Payer: Self-pay | Admitting: Obstetrics and Gynecology

## 2022-02-10 DIAGNOSIS — Z1231 Encounter for screening mammogram for malignant neoplasm of breast: Secondary | ICD-10-CM

## 2022-03-03 ENCOUNTER — Ambulatory Visit (INDEPENDENT_AMBULATORY_CARE_PROVIDER_SITE_OTHER): Payer: Medicare Other | Admitting: Obstetrics and Gynecology

## 2022-03-03 ENCOUNTER — Encounter: Payer: Self-pay | Admitting: Obstetrics and Gynecology

## 2022-03-03 VITALS — BP 154/74 | HR 70 | Ht 67.0 in | Wt 132.9 lb

## 2022-03-03 DIAGNOSIS — Z01419 Encounter for gynecological examination (general) (routine) without abnormal findings: Secondary | ICD-10-CM

## 2022-03-03 NOTE — Progress Notes (Signed)
HPI:      [name] is a 71 y.o. G2P1011 who LMP was No LMP recorded. Patient has had a hysterectomy.  Subjective:   She presents today for her annual examination.  She has no complaints.  She is using Vivelle dot and she is very happy with it.  She says I would like to stay on this the rest of my life if possible.  She is sleeping much better and has noticed a significant decline in her urinary tract infections. She has a history of hysterectomy in the distant past.    Hx: The following portions of the patient's history were reviewed and updated as appropriate:             She  has a past medical history of B12 deficiency, Endometriosis (04/2015), Hyperlipidemia, and Postmenopausal. She does not have any pertinent problems on file. She  has a past surgical history that includes Abdominal hysterectomy; Tubal ligation; Dilation and curettage of uterus; Colonoscopy; Cervical cone biopsy; Cystoscopy with biopsy (N/A, 05/05/2021); Cataract extraction w/PHACO (Left, 09/29/2021); and Cataract extraction w/PHACO (Right, 10/13/2021). Her family history includes COPD in her father; Diabetes in her mother; Heart attack in her mother; Hyperlipidemia in her brother, daughter, and sister; Kidney Stones in her brother; Kidney disease in her brother; Pulmonary embolism in her father. She  reports that she quit smoking about 31 years ago. Her smoking use included cigarettes. She has a 10.00 pack-year smoking history. She has never used smokeless tobacco. She reports current alcohol use. She reports that she does not use drugs. She has a current medication list which includes the following prescription(s): b-d 3cc luer-lok syr 25gx5/8", bd disp needles, cyanocobalamin, epinephrine, estradiol, viactiv calcium plus d, and vitamin d (ergocalciferol), and the following Facility-Administered Medications: sodium chloride. She is allergic to bee venom, iodinated contrast media, iohexol, cefaclor, eggs or  egg-derived products, doxycycline, and latex.       Review of Systems:  Review of Systems  Constitutional: Denied constitutional symptoms, night sweats, recent illness, fatigue, fever, insomnia and weight loss.  Eyes: Denied eye symptoms, eye pain, photophobia, vision change and visual disturbance.  Ears/Nose/Throat/Neck: Denied ear, nose, throat or neck symptoms, hearing loss, nasal discharge, sinus congestion and sore throat.  Cardiovascular: Denied cardiovascular symptoms, arrhythmia, chest pain/pressure, edema, exercise intolerance, orthopnea and palpitations.  Respiratory: Denied pulmonary symptoms, asthma, pleuritic pain, productive sputum, cough, dyspnea and wheezing.  Gastrointestinal: Denied, gastro-esophageal reflux, melena, nausea and vomiting.  Genitourinary: Denied genitourinary symptoms including symptomatic vaginal discharge, pelvic relaxation issues, and urinary complaints.  Musculoskeletal: Denied musculoskeletal symptoms, stiffness, swelling, muscle weakness and myalgia.  Dermatologic: Denied dermatology symptoms, rash and scar.  Neurologic: Denied neurology symptoms, dizziness, headache, neck pain and syncope.  Psychiatric: Denied psychiatric symptoms, anxiety and depression.  Endocrine: Denied endocrine symptoms including hot flashes and night sweats.   Meds:   Current Outpatient Medications on File Prior to Visit  Medication Sig Dispense Refill   B-D 3CC LUER-LOK SYR 25GX5/8" 25G X 5/8" 3 ML MISC USE AS DIRECTED 50 each 12   BD DISP NEEDLES 25G X 5/8" MISC USE AS DIRECTED WITH B12 SHOTS     cyanocobalamin (,VITAMIN B-12,) 1000 MCG/ML injection INJECT 1 ML INTO THE SKIN EVERY 6 DAYS 10 mL 5   EPINEPHrine 0.3 mg/0.3 mL IJ SOAJ injection INJECT INTRAMUSCULARLY AS DIRECTED 2 each 3   estradiol (VIVELLE-DOT) 0.05 MG/24HR patch APPLY 1 PATCH(0.05 MG) TOPICALLY TO THE SKIN 2 TIMES A WEEK 24 patch 3  VIACTIV CALCIUM PLUS D 650-12.5-40 MG-MCG CHEW      Vitamin D,  Ergocalciferol, (DRISDOL) 1.25 MG (50000 UNIT) CAPS capsule TAKE 1 CAPSULE BY MOUTH EVERY WEEK 4 capsule 12   Current Facility-Administered Medications on File Prior to Visit  Medication Dose Route Frequency Provider Last Rate Last Admin   0.9 %  sodium chloride infusion  500 mL Intravenous Once Irene Shipper, MD         Objective:     Vitals:   03/03/22 1114  BP: (!) 154/74  Pulse: 70    Filed Weights   03/03/22 1114  Weight: 132 lb 14.4 oz (60.3 kg)              Physical examination General NAD, Conversant  HEENT Atraumatic; Op clear with mmm.  Normo-cephalic. Pupils reactive. Anicteric sclerae  Thyroid/Neck Smooth without nodularity or enlargement. Normal ROM.  Neck Supple.  Skin No rashes, lesions or ulceration. Normal palpated skin turgor. No nodularity.  Breasts: No masses or discharge.  Symmetric.  No axillary adenopathy.  Lungs: Clear to auscultation.No rales or wheezes. Normal Respiratory effort, no retractions.  Heart: NSR.  No murmurs or rubs appreciated. No periferal edema  Abdomen: Soft.  Non-tender.  No masses.  No HSM. No hernia  Extremities: Moves all appropriately.  Normal ROM for age. No lymphadenopathy.  Neuro: Oriented to PPT.  Normal mood. Normal affect.     Pelvic:   Vulva: Normal appearance.  No lesions.   Vagina: No lesions or abnormalities noted.  Support: Normal pelvic support.  Urethra No masses tenderness or scarring.  Meatus Normal size without lesions or prolapse.  Cervix: Surgically absent   Anus: Normal exam.  No lesions.  Perineum: Normal exam.  No lesions.        Bimanual   Uterus: Surgically absent   Adnexae: No masses.  Non-tender to palpation.  Cul-de-sac: Negative for abnormality.     Assessment:    G2P1011 Patient Active Problem List   Diagnosis Date Noted   Microscopic hematuria 09/18/2019   Postmenopausal 01/21/2019   Cough 08/21/2016   Ache in joint 08/12/2016   Endometriosis 04/25/2015   Personal history of  reproductive and obstetrical problems 04/25/2015   HLD (hyperlipidemia) 04/25/2015   B12 deficiency 04/25/2015     1. Well woman exam with routine gynecological exam     Normal exam    Plan:            1.  Basic Screening Recommendations The basic screening recommendations for asymptomatic women were discussed with the patient during her visit.  The age-appropriate recommendations were discussed with her and the rational for the tests reviewed.  When I am informed by the patient that another primary care physician has previously obtained the age-appropriate tests and they are up-to-date, only outstanding tests are ordered and referrals given as necessary.  Abnormal results of tests will be discussed with her when all of her results are completed.  Routine preventative health maintenance measures emphasized: Exercise/Diet/Weight control, Tobacco Warnings, Alcohol/Substance use risks and Stress Management   2.  Continue Vivelle dot use Orders No orders of the defined types were placed in this encounter.   No orders of the defined types were placed in this encounter.         F/U  Return in about 1 year (around 03/04/2023) for Annual Physical.  Finis Bud, M.D. 03/03/2022 11:48 AM

## 2022-03-03 NOTE — Progress Notes (Signed)
Patients presents for annual exam today. She states doing well, had a cystoscopy that reduced patients UTI's and she is very happy. Patient also states enjoying HRT patch very much. Mammogram is scheduled for 03/16/2022. Annual labs have been deferred to PCP. Patient states no other questions or concerns at this time.

## 2022-03-16 ENCOUNTER — Ambulatory Visit
Admission: RE | Admit: 2022-03-16 | Discharge: 2022-03-16 | Disposition: A | Discharge status: Home / Self Care | Payer: Medicare Other | Source: Ambulatory Visit | Attending: Obstetrics and Gynecology | Admitting: Obstetrics and Gynecology

## 2022-03-16 DIAGNOSIS — Z1231 Encounter for screening mammogram for malignant neoplasm of breast: Secondary | ICD-10-CM | POA: Diagnosis not present

## 2022-04-16 ENCOUNTER — Encounter: Payer: Self-pay | Admitting: Family Medicine

## 2022-04-16 ENCOUNTER — Ambulatory Visit (INDEPENDENT_AMBULATORY_CARE_PROVIDER_SITE_OTHER): Payer: Medicare Other | Admitting: Family Medicine

## 2022-04-16 VITALS — BP 138/74 | HR 63 | Temp 98.0°F | Resp 14 | Wt 132.0 lb

## 2022-04-16 DIAGNOSIS — E538 Deficiency of other specified B group vitamins: Secondary | ICD-10-CM

## 2022-04-16 DIAGNOSIS — E782 Mixed hyperlipidemia: Secondary | ICD-10-CM

## 2022-04-16 DIAGNOSIS — Z780 Asymptomatic menopausal state: Secondary | ICD-10-CM

## 2022-04-16 NOTE — Progress Notes (Unsigned)
I,Roshena L Chambers,acting as a scribe for Wilhemena Durie, MD.,have documented all relevant documentation on the behalf of Wilhemena Durie, MD,as directed by  Wilhemena Durie, MD while in the presence of Wilhemena Durie, MD.   Established patient visit   [name]   DOB: [birth year]   71 y.o. Female  MRN: [id number] Visit Date: 04/16/2022  Today's healthcare provider: Wilhemena Durie, MD   Chief Complaint  Patient presents with   Follow-up   Subjective    HPI  Patient feels well and has no complaints Feels great as long as she does her B12 shots weekly.  She is willing to talk about lengthening the interval between the shots.  Follow up for Vitamin B12 Deficiency:  The patient was last seen for this 6 months ago. Changes made at last visit include; increased B12 dosing from every 6 days to once a week.  -----------------------------------------------------------------------------------------   Medications: Outpatient Medications Prior to Visit  Medication Sig   B-D 3CC LUER-LOK SYR 25GX5/8" 25G X 5/8" 3 ML MISC USE AS DIRECTED   BD DISP NEEDLES 25G X 5/8" MISC USE AS DIRECTED WITH B12 SHOTS   cyanocobalamin (,VITAMIN B-12,) 1000 MCG/ML injection INJECT 1 ML INTO THE SKIN EVERY 6 DAYS   EPINEPHrine 0.3 mg/0.3 mL IJ SOAJ injection INJECT INTRAMUSCULARLY AS DIRECTED   estradiol (VIVELLE-DOT) 0.05 MG/24HR patch APPLY 1 PATCH(0.05 MG) TOPICALLY TO THE SKIN 2 TIMES A WEEK   VIACTIV CALCIUM PLUS D 650-12.5-40 MG-MCG CHEW    Vitamin D, Ergocalciferol, (DRISDOL) 1.25 MG (50000 UNIT) CAPS capsule TAKE 1 CAPSULE BY MOUTH EVERY WEEK   Facility-Administered Medications Prior to Visit  Medication Dose Route Frequency Provider   0.9 %  sodium chloride infusion  500 mL Intravenous Once Irene Shipper, MD    Review of Systems  Constitutional:  Negative for appetite change, chills, fatigue and fever.  Respiratory:  Negative for chest tightness and  shortness of breath.   Cardiovascular:  Negative for chest pain and palpitations.  Gastrointestinal:  Negative for abdominal pain, nausea and vomiting.  Neurological:  Negative for dizziness and weakness.    Last thyroid functions Lab Results  Component Value Date   TSH 2.730 10/04/2020   Last vitamin B12 and Folate Lab Results  Component Value Date   VITAMINB12 >2000 (H) 08/05/2018       Objective    BP 138/74 (BP Location: Right Arm, Patient Position: Sitting, Cuff Size: Normal)   Pulse 63   Temp 98 F (36.7 C) (Oral)   Resp 14   Wt 132 lb (59.9 kg)   SpO2 100% Comment: room air  BMI 20.67 kg/m  BP Readings from Last 3 Encounters:  04/16/22 138/74  03/03/22 (!) 154/74  10/13/21 109/70   Wt Readings from Last 3 Encounters:  04/16/22 132 lb (59.9 kg)  03/03/22 132 lb 14.4 oz (60.3 kg)  11/10/21 132 lb (59.9 kg)      Physical Exam Vitals reviewed.  Constitutional:      Appearance: She is well-developed.  HENT:     Head: Normocephalic and atraumatic.     Right Ear: External ear normal.     Left Ear: External ear normal.     Nose: Nose normal.  Eyes:     General: No scleral icterus.    Conjunctiva/sclera: Conjunctivae normal.  Neck:     Thyroid: No thyromegaly.  Cardiovascular:     Rate and Rhythm: Normal rate and regular  rhythm.     Heart sounds: Normal heart sounds.  Pulmonary:     Effort: Pulmonary effort is normal.     Breath sounds: Normal breath sounds.  Abdominal:     Palpations: Abdomen is soft.  Skin:    General: Skin is warm and dry.  Neurological:     General: No focal deficit present.     Mental Status: She is alert and oriented to person, place, and time.  Psychiatric:        Behavior: Behavior normal.        Thought Content: Thought content normal.        Judgment: Judgment normal.       No results found for any visits on 04/16/22.  Assessment & Plan     1. B12 deficiency Try to take shots from every week to maybe 3 times a  month, every 10 days  2. Mixed hyperlipidemia   3. Postmenopausal Follow-up for well woman exam by Dr. Amalia Hailey   No follow-ups on file.      I, Wilhemena Durie, MD, have reviewed all documentation for this visit. The documentation on 04/20/22 for the exam, diagnosis, procedures, and orders are all accurate and complete.    Neena Beecham Cranford Mon, MD  [facility] [phone] (phone) [phone] (fax)  [place]

## 2022-05-01 ENCOUNTER — Other Ambulatory Visit: Payer: Self-pay | Admitting: Obstetrics and Gynecology

## 2022-05-01 DIAGNOSIS — N941 Unspecified dyspareunia: Secondary | ICD-10-CM

## 2022-05-01 DIAGNOSIS — N952 Postmenopausal atrophic vaginitis: Secondary | ICD-10-CM

## 2022-05-05 ENCOUNTER — Other Ambulatory Visit: Payer: Self-pay | Admitting: Obstetrics and Gynecology

## 2022-05-05 DIAGNOSIS — N952 Postmenopausal atrophic vaginitis: Secondary | ICD-10-CM

## 2022-05-05 DIAGNOSIS — N941 Unspecified dyspareunia: Secondary | ICD-10-CM

## 2022-05-11 ENCOUNTER — Telehealth: Payer: Self-pay

## 2022-05-11 NOTE — Telephone Encounter (Signed)
Prior Auth request received for Estradiol. Pending auth # [id number] through [facility]

## 2022-05-12 NOTE — Telephone Encounter (Signed)
Received denial. Contacted [employer] and submitted an appeal, faxed office notes and necessary documentation.

## 2022-05-19 LAB — CBC WITH DIFFERENTIAL/PLATELET
Basophils Absolute: 0.1 10*3/uL (ref 0.0–0.2)
Basos: 1 %
EOS (ABSOLUTE): 0.1 10*3/uL (ref 0.0–0.4)
Eos: 1 %
Hematocrit: 36.5 % (ref 34.0–46.6)
Hemoglobin: 12.2 g/dL (ref 11.1–15.9)
Immature Grans (Abs): 0 10*3/uL (ref 0.0–0.1)
Immature Granulocytes: 0 %
Lymphocytes Absolute: 1.4 10*3/uL (ref 0.7–3.1)
Lymphs: 23 %
MCH: 29.5 pg (ref 26.6–33.0)
MCHC: 33.4 g/dL (ref 31.5–35.7)
MCV: 88 fL (ref 79–97)
Monocytes Absolute: 0.4 10*3/uL (ref 0.1–0.9)
Monocytes: 6 %
Neutrophils Absolute: 4.1 10*3/uL (ref 1.4–7.0)
Neutrophils: 69 %
Platelets: 220 10*3/uL (ref 150–450)
RBC: 4.14 x10E6/uL (ref 3.77–5.28)
RDW: 14.1 % (ref 11.7–15.4)
WBC: 6 10*3/uL (ref 3.4–10.8)

## 2022-05-19 LAB — COMPREHENSIVE METABOLIC PANEL
ALT: 14 IU/L (ref 0–32)
AST: 16 IU/L (ref 0–40)
Albumin/Globulin Ratio: 2.1 (ref 1.2–2.2)
Albumin: 4.4 g/dL (ref 3.8–4.8)
Alkaline Phosphatase: 121 IU/L (ref 44–121)
BUN/Creatinine Ratio: 22 (ref 12–28)
BUN: 19 mg/dL (ref 8–27)
Bilirubin Total: 0.4 mg/dL (ref 0.0–1.2)
CO2: 22 mmol/L (ref 20–29)
Calcium: 9 mg/dL (ref 8.7–10.3)
Chloride: 103 mmol/L (ref 96–106)
Creatinine, Ser: 0.85 mg/dL (ref 0.57–1.00)
Globulin, Total: 2.1 g/dL (ref 1.5–4.5)
Glucose: 93 mg/dL (ref 70–99)
Potassium: 4.2 mmol/L (ref 3.5–5.2)
Sodium: 140 mmol/L (ref 134–144)
Total Protein: 6.5 g/dL (ref 6.0–8.5)
eGFR: 73 mL/min/{1.73_m2} (ref >59)

## 2022-05-19 LAB — LIPID PANEL WITH LDL/HDL RATIO
Cholesterol, Total: 245 mg/dL — ABNORMAL HIGH (ref 100–199)
HDL: 49 mg/dL (ref >39)
LDL Chol Calc (NIH): 157 mg/dL — ABNORMAL HIGH (ref 0–99)
LDL/HDL Ratio: 3.2 ratio (ref 0.0–3.2)
Triglycerides: 213 mg/dL — ABNORMAL HIGH (ref 0–149)
VLDL Cholesterol Cal: 39 mg/dL (ref 5–40)

## 2022-05-19 LAB — TSH: TSH: 2.91 u[IU]/mL (ref 0.450–4.500)

## 2022-05-21 ENCOUNTER — Telehealth: Payer: Self-pay

## 2022-05-21 ENCOUNTER — Encounter: Payer: Self-pay | Admitting: *Deleted

## 2022-05-25 NOTE — Telephone Encounter (Signed)
Received approval for Estradiol.  Auth # APP- [id number] valid 05/11/22-08/03/23. Patient made aware via voicemail.

## 2022-06-01 ENCOUNTER — Other Ambulatory Visit: Payer: Self-pay | Admitting: Family Medicine

## 2022-06-01 ENCOUNTER — Telehealth: Payer: Self-pay | Admitting: Family Medicine

## 2022-06-01 NOTE — Telephone Encounter (Signed)
[place] faxed refill request for the following medications:  Vitamin D, Ergocalciferol, (DRISDOL) 1.25 MG (50000 UNIT) CAPS capsule   Please advise.

## 2022-06-01 NOTE — Telephone Encounter (Signed)
Medication Refill - Medication: EPINEPHrine 0.3 mg/0.3 mL IJ SOAJ injection  Has the patient contacted their pharmacy? Yes  (Agent: If yes, when and what did the pharmacy advise?) Contact PCP office because its not on medication list  Preferred Pharmacy (with phone number or street name):  [facility] [place] [place] - [address] Phone:  [phone]  Fax:  [phone]      Has the patient been seen for an appointment in the last year OR does the patient have an upcoming appointment? Yes.    Agent: Please be advised that RX refills may take up to 3 business days. We ask that you follow-up with your pharmacy.

## 2022-06-02 ENCOUNTER — Other Ambulatory Visit: Payer: Self-pay

## 2022-06-02 MED ORDER — EPINEPHRINE 0.3 MG/0.3ML IJ SOAJ: INTRAMUSCULAR | 3 refills | Status: AC

## 2022-06-02 MED ORDER — VITAMIN D (ERGOCALCIFEROL) 1.25 MG (50000 UNIT) PO CAPS
50000.0000 [IU] | ORAL_CAPSULE | ORAL | 0 refills | Status: DC
Start: 2022-06-02 — End: 2022-06-29

## 2022-06-02 NOTE — Telephone Encounter (Signed)
Requested medication (s) are due for refill today: expired medication  Requested medication (s) are on the active medication list: yes  Last refill:  09/13/19 #2 each 3 refills  Future visit scheduled: no last physical 8 months ago   Notes to clinic:  expired medication. Patient requesting refill. Do you want to renew Rx?     Requested Prescriptions  Pending Prescriptions Disp Refills   EPINEPHrine 0.3 mg/0.3 mL IJ SOAJ injection 2 each 3    Sig: INJECT INTRAMUSCULARLY AS DIRECTED     Immunology: Antidotes Passed - 06/01/2022  1:35 PM      Passed - Valid encounter within last 12 months    Recent Outpatient Visits           1 month ago B12 deficiency   [facility] Jerrol Banana., MD   8 months ago Annual physical exam   [facility] Jerrol Banana., MD   9 months ago [place] Jerrol Banana., MD   1 year ago Medicare annual wellness visit, subsequent   [facility] Jerrol Banana., MD   2 years ago Annual physical exam   [facility] Jerrol Banana., MD       Future Appointments             In 2 weeks McGowan, Gordan Payment [place]

## 2022-06-15 NOTE — Progress Notes (Unsigned)
07/22/2018 4:09 PM   Tracey Wolf Feb 13, 1951 [id number]  Referring provider: Jerrol Banana., MD No address on file   Urological history: 1. High risk hematuria -former smoker -CTU 2018 NED -cysto 2018 NED -cysto 2022 - Whitish heaped up area, approximately 1 cm in which there is an embedded what looks like calcification or thrombosed vessel in the central portion of the trigone -bladder biopsy 2022 - UROTHELIUM WITH SQUAMOUS METAPLASIA, CYSTITIS CYSTICA, AND FOCAL AREA OF NON-SPECIFIC STROMAL HEMORRHAGE. - NEGATIVE FOR DYSPLASIA AND MALIGNANCY -seen by nephrology-no recommendations -no reports of gross hematuria -UA ***  No chief complaint on file.    HPI: Tracey Wolf is a 71 y.o. female who presents today for yearly follow up.   UA ***  PVR ***   PMH: Past Medical History:  Diagnosis Date   B12 deficiency    Endometriosis 04/2015   Hyperlipidemia    Postmenopausal     Surgical History: Past Surgical History:  Procedure Laterality Date   ABDOMINAL HYSTERECTOMY     CATARACT EXTRACTION W/PHACO Left 09/29/2021   Procedure: CATARACT EXTRACTION PHACO AND INTRAOCULAR LENS PLACEMENT ([place]) LEFT PANOPTIX TORIC;  Surgeon: Eulogio Bear, MD;  Location: [facility];  Service: Ophthalmology;  Laterality: Left;  1.85 0:21.7   CATARACT EXTRACTION W/PHACO Right 10/13/2021   Procedure: CATARACT EXTRACTION PHACO AND INTRAOCULAR LENS PLACEMENT ([facility]) RIGHT PANOPTIX TORIC 2.50 00:30.0;  Surgeon: Eulogio Bear, MD;  Location: [facility];  Service: Ophthalmology;  Laterality: Right;   CERVICAL CONE BIOPSY     COLONOSCOPY     CYSTOSCOPY WITH BIOPSY N/A 05/05/2021   Procedure: CYSTOSCOPY WITH BLADDER BIOPSY;  Surgeon: Hollice Espy, MD;  Location: [facility];  Service: Urology;  Laterality: N/A;   DILATION AND CURETTAGE OF UTERUS     TUBAL LIGATION      Home Medications:  Allergies as of 06/16/2022       Reactions   Bee Venom  Anaphylaxis   Iodinated Contrast Media Anaphylaxis   Iohexol Anaphylaxis    Desc: had severe reaction in october '05   swelling, sob and throat closing   Cefaclor    Eggs Or Egg-derived Products Nausea And Vomiting   Doxycycline Rash   Latex Rash        Medication List        Accurate as of June 15, 2022  4:09 PM. If you have any questions, ask your nurse or doctor.          B-D 3CC LUER-LOK SYR 25GX5/8" 25G X 5/8" 3 ML Misc Generic drug: SYRINGE-NEEDLE (DISP) 3 ML USE AS DIRECTED   BD Disp Needles 25G X 5/8" Misc Generic drug: NEEDLE (DISP) 25 G USE AS DIRECTED WITH B12 SHOTS   cyanocobalamin 1000 MCG/ML injection Commonly known as: VITAMIN B12 INJECT 1 ML INTO THE SKIN EVERY 6 DAYS   EPINEPHrine 0.3 mg/0.3 mL Soaj injection Commonly known as: EPI-PEN INJECT INTRAMUSCULARLY AS DIRECTED   estradiol 0.05 MG/24HR patch Commonly known as: VIVELLE-DOT APPLY 1 PATCH(0.05 MG) TOPICALLY TO THE SKIN 2 TIMES A WEEK   Viactiv Calcium Plus D 650-12.5-40 MG-MCG-MCG Chew Generic drug: Calcium-Vitamin D-Vitamin K   Vitamin D (Ergocalciferol) 1.25 MG (50000 UNIT) Caps capsule Commonly known as: DRISDOL Take 1 capsule (50,000 Units total) by mouth once a week.        Allergies:  Allergies  Allergen Reactions   Bee Venom Anaphylaxis   Iodinated Contrast Media Anaphylaxis   Iohexol Anaphylaxis  Desc: had severe reaction in october '05   swelling, sob and throat closing    Cefaclor    Eggs Or Egg-Derived Products Nausea And Vomiting   Doxycycline Rash   Latex Rash    Family History: Family History  Problem Relation Age of Onset   Diabetes Mother    Heart attack Mother    COPD Father    Pulmonary embolism Father    Hyperlipidemia Sister    Hyperlipidemia Daughter    Hyperlipidemia Brother    Kidney disease Brother    Kidney Stones Brother    Kidney cancer Neg Hx    Prostate cancer Neg Hx    Colon cancer Neg Hx    Colon polyps Neg Hx    Esophageal  cancer Neg Hx    Rectal cancer Neg Hx    Stomach cancer Neg Hx    Breast cancer Neg Hx     Social History:  reports that she quit smoking about 31 years ago. Her smoking use included cigarettes. She has a 10.00 pack-year smoking history. She has never used smokeless tobacco. She reports current alcohol use. She reports that she does not use drugs.  ROS: For pertinent review of systems please refer to history of present illness   Physical Exam: There were no vitals taken for this visit.  Constitutional:  Well nourished. Alert and oriented, No acute distress. HEENT: [place] AT, moist mucus membranes.  Trachea midline, no masses. Cardiovascular: No clubbing, cyanosis, or edema. Respiratory: Normal respiratory effort, no increased work of breathing. GU: No CVA tenderness.  No bladder fullness or masses. Vulvovaginal atrophy w/ pallor, loss of rugae, introital retraction, excoriations.  Vulvar thinning, fusion of labia, clitoral hood retraction, prominent urethral meatus.   *** external genitalia, *** pubic hair distribution, no lesions.  Normal urethral meatus, no lesions, no prolapse, no discharge.   No urethral masses, tenderness and/or tenderness. No bladder fullness, tenderness or masses. *** vagina mucosa, *** estrogen effect, no discharge, no lesions, *** pelvic support, *** cystocele and *** rectocele noted.  No cervical motion tenderness.  Uterus is freely mobile and non-fixed.  No adnexal/parametria masses or tenderness noted.  Anus and perineum are without rashes or lesions.   ***  Neurologic: Grossly intact, no focal deficits, moving all 4 extremities. Psychiatric: Normal mood and affect.    Laboratory Data:    Latest Ref Rng & Units 05/18/2022    1:44 PM 10/04/2020   10:34 AM 08/05/2018   10:25 AM  CMP  Glucose 70 - 99 mg/dL 93  94  93   BUN 8 - 27 mg/dL '19  18  15   '$ Creatinine 0.57 - 1.00 mg/dL 0.85  0.90  0.86   Sodium 134 - 144 mmol/L 140  140  141   Potassium 3.5 - 5.2 mmol/L 4.2   5.0  4.8   Chloride 96 - 106 mmol/L 103  103  104   CO2 20 - 29 mmol/L '22  20  22   '$ Calcium 8.7 - 10.3 mg/dL 9.0  9.3  8.7   Total Protein 6.0 - 8.5 g/dL 6.5  6.9  6.2   Total Bilirubin 0.0 - 1.2 mg/dL 0.4  0.4  0.3   Alkaline Phos 44 - 121 IU/L 121  117  151   AST 0 - 40 IU/L '16  20  11   '$ ALT 0 - 32 IU/L '14  8  9        '$ Latest Ref Rng & Units 05/18/2022  1:44 PM 10/04/2020   10:34 AM 08/05/2018   10:25 AM  CBC  WBC 3.4 - 10.8 x10E3/uL 6.0  4.9  4.8   Hemoglobin 11.1 - 15.9 g/dL 12.2  12.9  11.7   Hematocrit 34.0 - 46.6 % 36.5  39.6  36.1   Platelets 150 - 450 x10E3/uL 220  192  198     Urinalysis *** I have reviewed the labs.    Pertinent imaging ***  Assessment & Plan:   1. High risk hematuria -former smoker -work up 2018 - NED -cysto (2022) - cystitis cystica -no reports of gross heme -UA ***    No follow-ups on file.  These notes generated with voice recognition software. I apologize for typographical errors.  Zara Council, [place] [address] [place], [place], [place] [address] [phone]

## 2022-06-16 ENCOUNTER — Ambulatory Visit: Payer: Medicare Other | Admitting: Urology

## 2022-06-16 ENCOUNTER — Encounter: Payer: Self-pay | Admitting: Urology

## 2022-06-16 VITALS — BP 159/77 | HR 74 | Ht 67.0 in | Wt 134.0 lb

## 2022-06-16 DIAGNOSIS — R319 Hematuria, unspecified: Secondary | ICD-10-CM | POA: Diagnosis not present

## 2022-06-16 LAB — URINALYSIS, COMPLETE
Bilirubin, UA: NEGATIVE
Glucose, UA: NEGATIVE
Leukocytes,UA: NEGATIVE
Nitrite, UA: NEGATIVE
Protein,UA: NEGATIVE
RBC, UA: NEGATIVE
Specific Gravity, UA: 1.01 (ref 1.005–1.030)
Urobilinogen, Ur: 0.2 mg/dL (ref 0.2–1.0)
pH, UA: 7 (ref 5.0–7.5)

## 2022-06-16 LAB — MICROSCOPIC EXAMINATION

## 2022-06-16 LAB — BLADDER SCAN AMB NON-IMAGING

## 2022-06-29 ENCOUNTER — Other Ambulatory Visit: Payer: Self-pay | Admitting: Physician Assistant

## 2022-07-10 ENCOUNTER — Other Ambulatory Visit: Payer: Self-pay | Admitting: Family Medicine

## 2022-07-10 DIAGNOSIS — E538 Deficiency of other specified B group vitamins: Secondary | ICD-10-CM

## 2022-07-10 NOTE — Telephone Encounter (Signed)
Requested medication (s) are due for refill today: yes  Requested medication (s) are on the active medication list: yes  Last refill:  06/30/21  Future visit scheduled: yes  Notes to clinic:  Unable to refill per protocol due to failed labs, no updated results.      Requested Prescriptions  Pending Prescriptions Disp Refills   cyanocobalamin (VITAMIN B12) 1000 MCG/ML injection 10 mL 5    Sig: INJECT 1 ML INTO THE SKIN EVERY 6 DAYS     Endocrinology:  Vitamins - Vitamin B12 Failed - 07/10/2022  1:10 PM      Failed - B12 Level in normal range and within 360 days    Vitamin B-12  Date Value Ref Range Status  08/05/2018 >2000 (H) 232 - 1245 pg/mL Final         Passed - HCT in normal range and within 360 days    Hematocrit  Date Value Ref Range Status  05/18/2022 36.5 34.0 - 46.6 % Final         Passed - HGB in normal range and within 360 days    Hemoglobin  Date Value Ref Range Status  05/18/2022 12.2 11.1 - 15.9 g/dL Final         Passed - Valid encounter within last 12 months    Recent Outpatient Visits           2 months ago B12 deficiency   [facility] Jerrol Banana., MD   9 months ago Annual physical exam   [facility] Jerrol Banana., MD   10 months ago [place] Jerrol Banana., MD   1 year ago Medicare annual wellness visit, subsequent   [facility] Jerrol Banana., MD   2 years ago Annual physical exam   [facility] Jerrol Banana., MD       Future Appointments             In 11 months McGowan, Gordan Payment [facility] [place]

## 2022-07-10 NOTE — Addendum Note (Signed)
Addended by: Durwin Nora on: 07/10/2022 01:10 PM   Modules accepted: Orders

## 2022-07-10 NOTE — Telephone Encounter (Signed)
[facility] faxed refill request for the following medications:   cyanocobalamin (,VITAMIN B-12,) 1000 MCG/ML injection    Please advise

## 2022-07-17 ENCOUNTER — Other Ambulatory Visit: Payer: Self-pay | Admitting: Family Medicine

## 2022-07-17 DIAGNOSIS — E538 Deficiency of other specified B group vitamins: Secondary | ICD-10-CM

## 2022-07-17 MED ORDER — CYANOCOBALAMIN 1000 MCG/ML IJ SOLN: INTRAMUSCULAR | 0 refills | Status: AC

## 2022-07-17 NOTE — Telephone Encounter (Signed)
Copied from [place] [id number]. Topic: General - Other >> Jul 17, 2022  9:58 AM Everette C wrote: Reason for CRM: Medication Refill - Medication: cyanocobalamin (,VITAMIN B-12,) 1000 MCG/ML injection [[id number]]  Has the patient contacted their pharmacy? Yes.  The patient has been directed to contact their PCP  (Agent: If no, request that the patient contact the pharmacy for the refill. If patient does not wish to contact the pharmacy document the reason why and proceed with request.) (Agent: If yes, when and what did the pharmacy advise?)  Preferred Pharmacy (with phone number or street name): [id number] - [place], [place] - [address] [place] [address] Phone: [phone] Fax: [phone] Hours: Not open 24 hours   Has the patient been seen for an appointment in the last year OR does the patient have an upcoming appointment? Yes.    Agent: Please be advised that RX refills may take up to 3 business days. We ask that you follow-up with your pharmacy.

## 2022-07-17 NOTE — Telephone Encounter (Signed)
Requested medications are due for refill today.  unsure  Requested medications are on the active medications list.  yes  Last refill. 05/30/2021 #10 5 rf  Future visit scheduled.   no  Notes to clinic.  Please review for refill - Dr. Rosanna Randy pt.    Requested Prescriptions  Pending Prescriptions Disp Refills   cyanocobalamin (VITAMIN B12) 1000 MCG/ML injection 10 mL 5    Sig: INJECT 1 ML INTO THE SKIN EVERY 6 DAYS     Endocrinology:  Vitamins - Vitamin B12 Failed - 07/17/2022 11:22 AM      Failed - B12 Level in normal range and within 360 days    Vitamin B-12  Date Value Ref Range Status  08/05/2018 >2000 (H) 232 - 1245 pg/mL Final         Passed - HCT in normal range and within 360 days    Hematocrit  Date Value Ref Range Status  05/18/2022 36.5 34.0 - 46.6 % Final         Passed - HGB in normal range and within 360 days    Hemoglobin  Date Value Ref Range Status  05/18/2022 12.2 11.1 - 15.9 g/dL Final         Passed - Valid encounter within last 12 months    Recent Outpatient Visits           3 months ago B12 deficiency   [facility] Jerrol Banana., MD   9 months ago Annual physical exam   [facility] Jerrol Banana., MD   10 months ago [place] Jerrol Banana., MD   1 year ago Medicare annual wellness visit, subsequent   [facility] Jerrol Banana., MD   2 years ago Annual physical exam   [facility] Jerrol Banana., MD       Future Appointments             In 11 months McGowan, Gordan Payment [facility] [place]

## 2022-08-11 IMAGING — US US PELVIS COMPLETE WITH TRANSVAGINAL
1 series · 14 of 25 positions shown · non-contrast
Comparison: None

CLINICAL DATA: LEFT lower quadrant pain off and on



[Series 1: us pelvic complete with transvaginal · 14 of 28 slices shown]
[im 1/28]
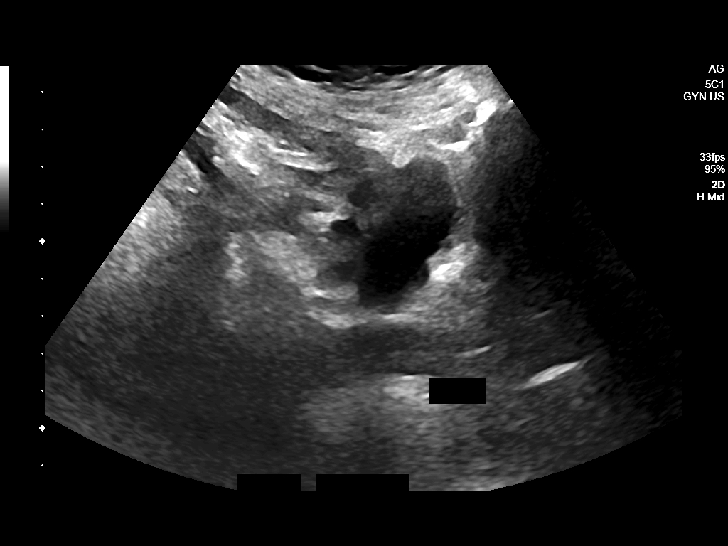
[im 3/28]
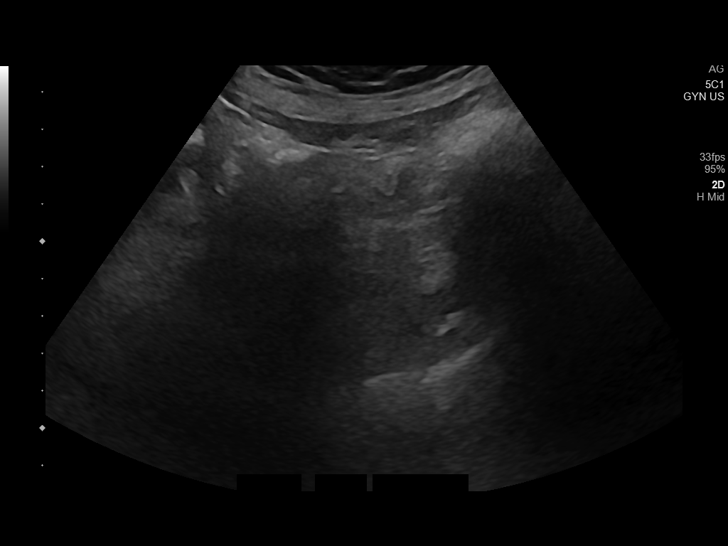
[im 5/28]
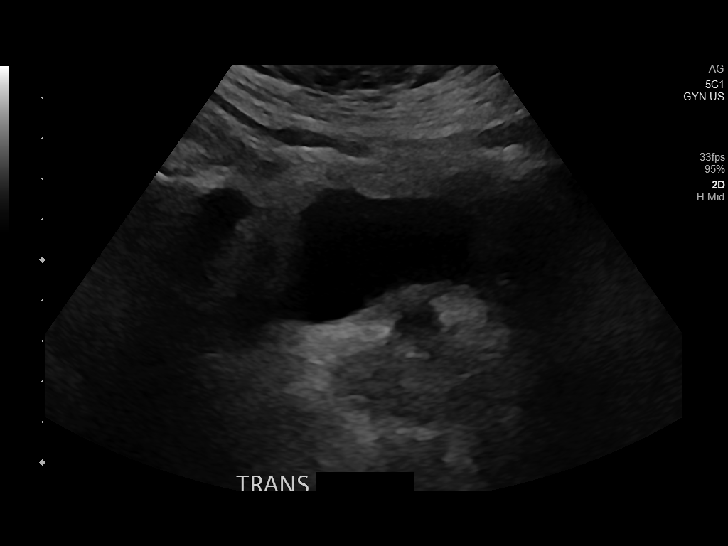
[im 7/28]
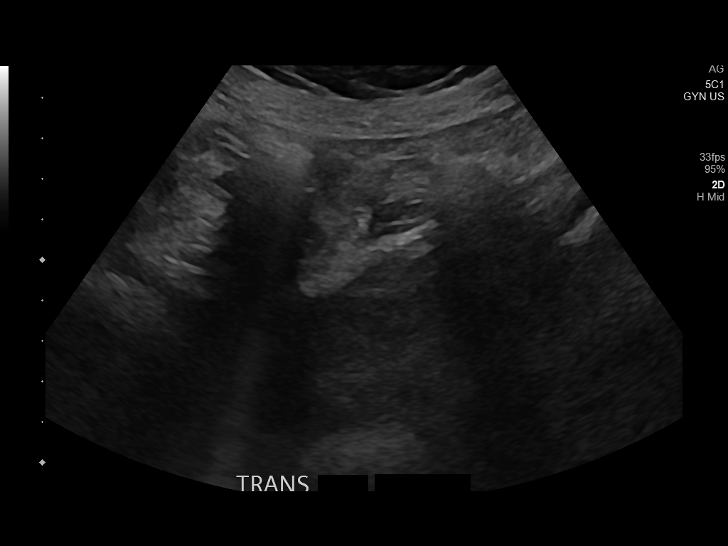
[im 10/28]
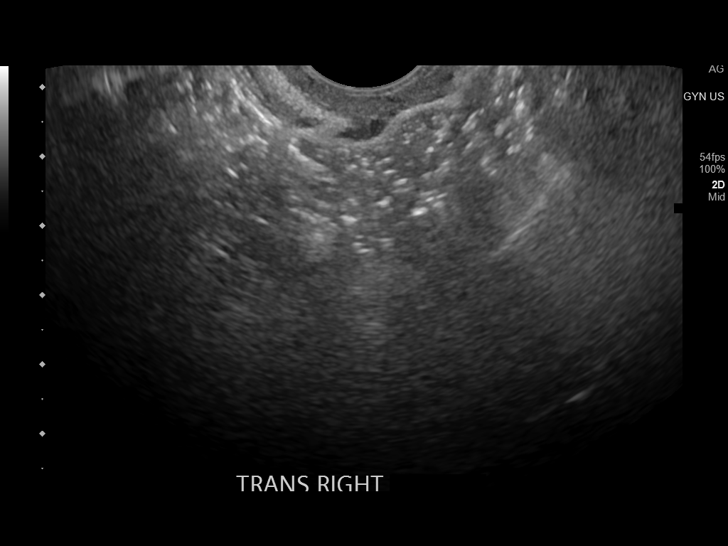
[im 11/28]
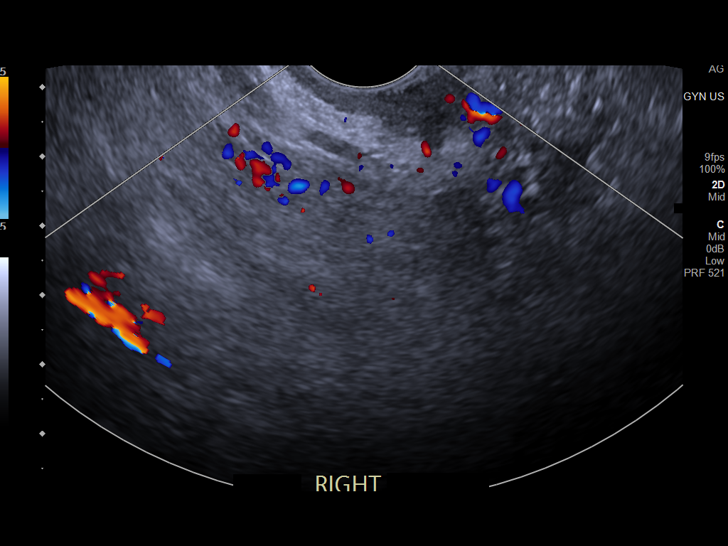
[im 13/28]
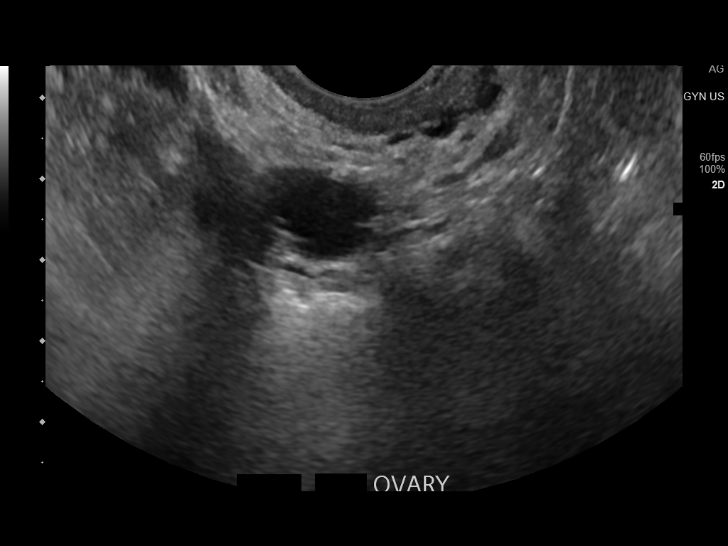
[im 15/28]
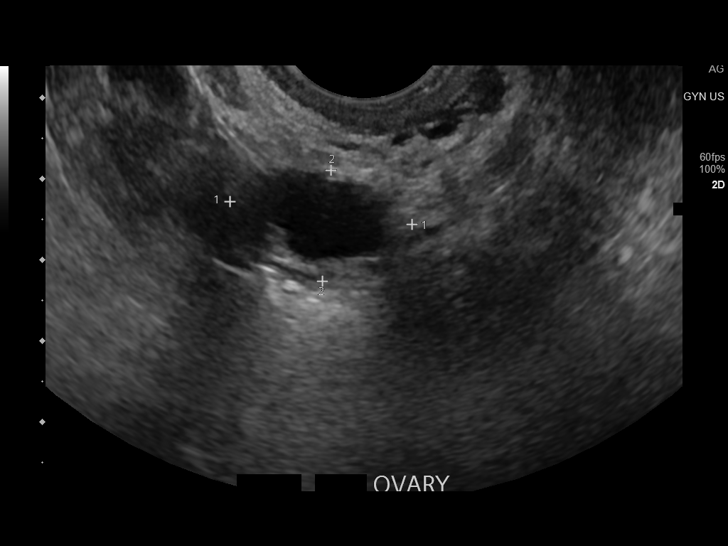
[im 17/28]
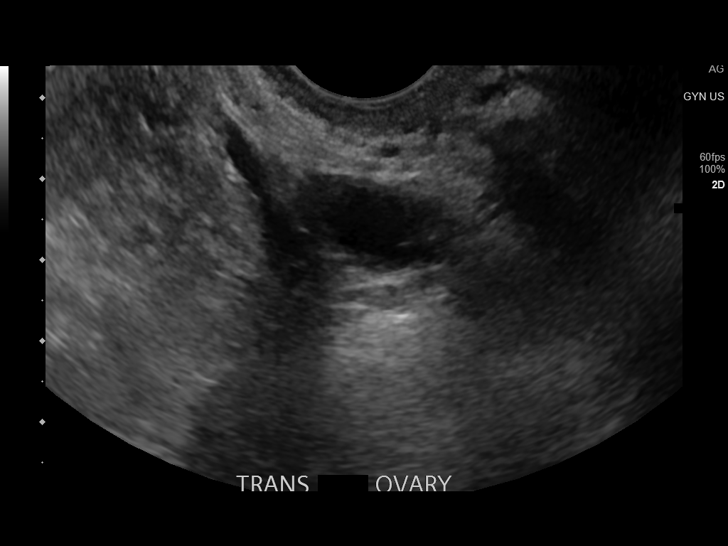
[im 19/28]
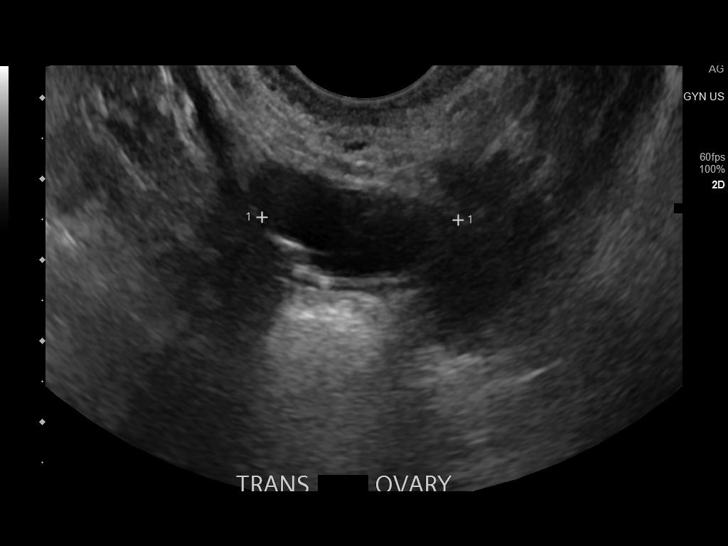
[im 21/28]
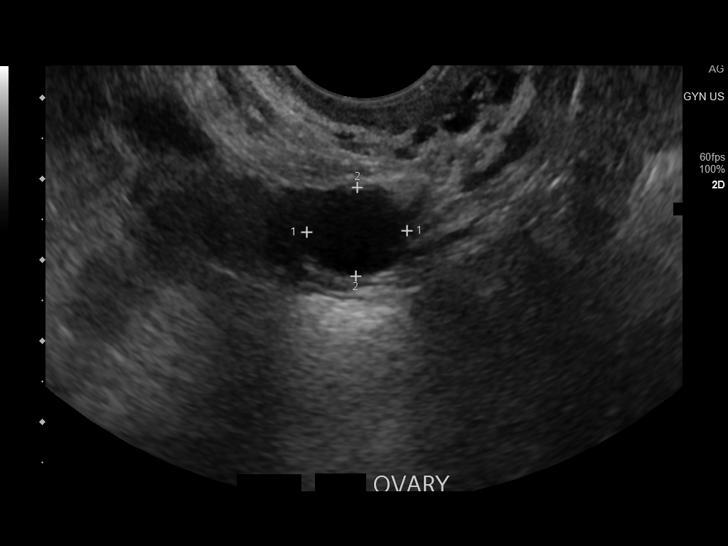
[im 23/28]
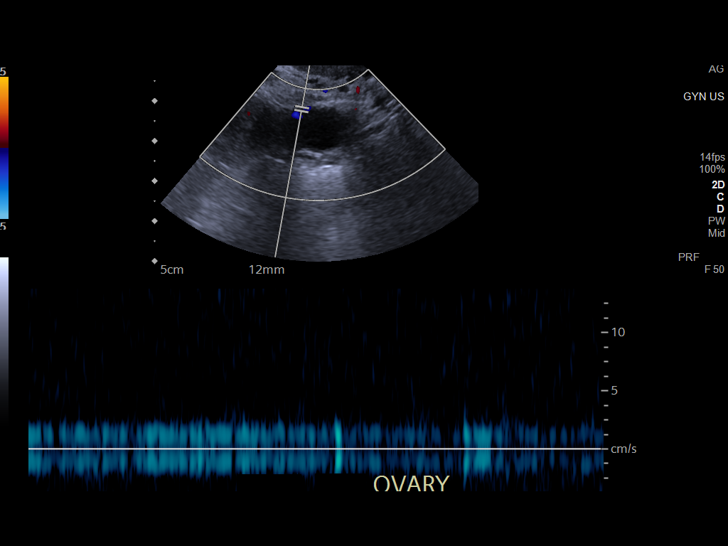
[im 25/28]
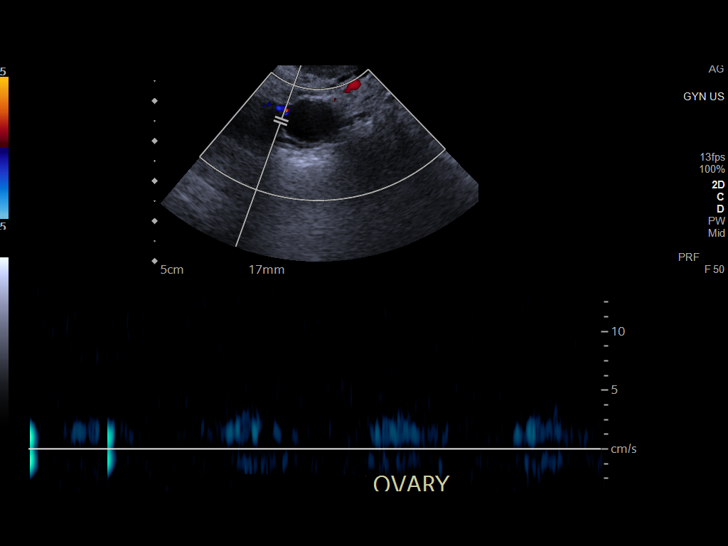
[im 28/28]
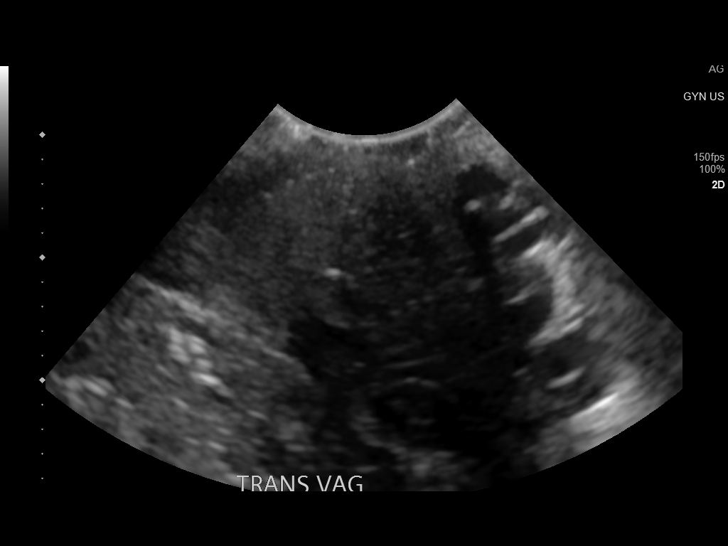

[14 of 25 positions shown; findings below may reference images not displayed]

FINDINGS: Uterus

Surgically absent

Endometrium

Surgically absent

Right ovary

Surgically absent

Left ovary

Measurements: 2.3 x 1.4 x 2.4 cm = volume: 4 mL. 1.6 cm left ovarian
benign simple cyst. No follow-up imaging is recommended. Reference:
Radiology [DATE]):[id number]

Other findings

No free pelvic fluid.  No adnexal masses.
IMPRESSION: Surgical absence of uterus and RIGHT ovary.

No significant pelvic sonographic abnormalities.

## 2022-09-18 DIAGNOSIS — E538 Deficiency of other specified B group vitamins: Secondary | ICD-10-CM | POA: Diagnosis not present

## 2022-09-18 DIAGNOSIS — M25521 Pain in right elbow: Secondary | ICD-10-CM | POA: Diagnosis not present

## 2022-09-18 DIAGNOSIS — Z0000 Encounter for general adult medical examination without abnormal findings: Secondary | ICD-10-CM | POA: Diagnosis not present

## 2022-09-20 ENCOUNTER — Other Ambulatory Visit: Payer: Self-pay | Admitting: Physician Assistant

## 2022-09-23 ENCOUNTER — Telehealth: Payer: Self-pay | Admitting: Family Medicine

## 2022-09-23 NOTE — Telephone Encounter (Signed)
Called patient to schedule Medicare Annual Wellness Visit (AWV). Left message for patient to call back and schedule Medicare Annual Wellness Visit (AWV).re-scheduled AWV on 11/12/2022  Last date of AWV: 11/11/2022  Please schedule an appointment at any time with nha.  If any questions, please contact me at [phone].  Thank you ,  [place] Direct Dial: [phone]

## 2022-09-28 ENCOUNTER — Telehealth: Payer: Self-pay | Admitting: Family Medicine

## 2022-09-28 NOTE — Telephone Encounter (Signed)
Called patient to schedule Medicare Annual Wellness Visit (AWV). Left message for patient to call back and schedule Medicare Annual Wellness Visit (AWV).  Last date of AWV: 11/10/2021  Please schedule an appointment at any time with NHA.  If any questions, please contact me at [phone].  Thank you ,  [place] Direct Dial: [phone]

## 2022-12-11 ENCOUNTER — Other Ambulatory Visit: Payer: Self-pay | Admitting: Physician Assistant

## 2022-12-31 NOTE — Telephone Encounter (Signed)
You  Drubel Nurse7 months ago    Contacted patient to advised of lab results. LVMTCB. CRM created. Ok for [facility] to advise

## 2023-02-13 ENCOUNTER — Other Ambulatory Visit: Payer: Self-pay | Admitting: Obstetrics and Gynecology

## 2023-02-13 DIAGNOSIS — N941 Unspecified dyspareunia: Secondary | ICD-10-CM

## 2023-02-13 DIAGNOSIS — N952 Postmenopausal atrophic vaginitis: Secondary | ICD-10-CM

## 2023-02-15 ENCOUNTER — Other Ambulatory Visit: Payer: Self-pay | Admitting: Obstetrics and Gynecology

## 2023-02-15 DIAGNOSIS — Z1231 Encounter for screening mammogram for malignant neoplasm of breast: Secondary | ICD-10-CM

## 2023-02-15 NOTE — Telephone Encounter (Signed)
Pt left msg on triage requesting a call back to schedule annual with Dr. Logan Bores. Pls call pt to schedule appt.

## 2023-02-15 NOTE — Telephone Encounter (Signed)
One additional refill until annual in August.

## 2023-03-16 ENCOUNTER — Ambulatory Visit: Payer: Medicare Other | Admitting: Obstetrics and Gynecology

## 2023-03-24 ENCOUNTER — Ambulatory Visit (INDEPENDENT_AMBULATORY_CARE_PROVIDER_SITE_OTHER): Payer: Medicare Other | Admitting: Obstetrics and Gynecology

## 2023-03-24 ENCOUNTER — Encounter: Payer: Self-pay | Admitting: Obstetrics and Gynecology

## 2023-03-24 VITALS — BP 150/70 | HR 67 | Ht 67.0 in | Wt 131.1 lb

## 2023-03-24 DIAGNOSIS — N941 Unspecified dyspareunia: Secondary | ICD-10-CM

## 2023-03-24 DIAGNOSIS — Z01419 Encounter for gynecological examination (general) (routine) without abnormal findings: Secondary | ICD-10-CM | POA: Diagnosis not present

## 2023-03-24 DIAGNOSIS — N952 Postmenopausal atrophic vaginitis: Secondary | ICD-10-CM

## 2023-03-24 MED ORDER — ESTRADIOL 0.05 MG/24HR TD PTTW
MEDICATED_PATCH | TRANSDERMAL | 3 refills | Status: DC
Start: 2023-03-24 — End: 2023-04-14

## 2023-03-24 NOTE — Progress Notes (Addendum)
HPI:      Tracey Wolf is a 72 y.o. G2P1011 who LMP was No LMP recorded. Patient has had a hysterectomy.  Subjective:   She presents today for her annual examination.  She is up-to-date on everything.  She is using Vivelle-Dot twice weekly and likes it.  She would like to continue. 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    Hx: The following portions of the patient's history were reviewed and updated as appropriate:             She  has a past medical history of B12 deficiency, Endometriosis (04/2015), Hyperlipidemia, and Postmenopausal. She does not have any pertinent problems on file. She  has a past surgical history that includes Abdominal hysterectomy; Tubal ligation; Dilation and curettage of uterus; Colonoscopy; Cervical cone biopsy; Cystoscopy with biopsy (N/A, 05/05/2021); Cataract extraction w/PHACO (Left, 09/29/2021); and Cataract extraction w/PHACO (Right, 10/13/2021). Her family history includes COPD in her father; Diabetes in her mother; Heart attack in her mother; Hyperlipidemia in her brother, daughter, and sister; Kidney Stones in her brother; Kidney disease in her brother; Pulmonary embolism in her father. She  reports that she quit smoking about 32 years ago. Her smoking use included cigarettes. She started smoking about 52 years ago. She has a 10 pack-year smoking history. She has never used smokeless tobacco. She reports current alcohol use. She reports that she does not use drugs. She has a current medication list which includes the following prescription(s): b-d 3cc luer-lok syr 25gx5/8", bd disp needles, cyanocobalamin, epinephrine, viactiv calcium plus d, vitamin d (ergocalciferol), and estradiol, and the following Facility-Administered Medications: sodium chloride. She is allergic to bee venom, iodinated contrast media, iohexol, cefaclor, egg-derived products, doxycycline, and latex.       Review of Systems:  Review of  Systems  Constitutional: Denied constitutional symptoms, night sweats, recent illness, fatigue, fever, insomnia and weight loss.  Eyes: Denied eye symptoms, eye pain, photophobia, vision change and visual disturbance.  Ears/Nose/Throat/Neck: Denied ear, nose, throat or neck symptoms, hearing loss, nasal discharge, sinus congestion and sore throat.  Cardiovascular: Denied cardiovascular symptoms, arrhythmia, chest pain/pressure, edema, exercise intolerance, orthopnea and palpitations.  Respiratory: Denied pulmonary symptoms, asthma, pleuritic pain, productive sputum, cough, dyspnea and wheezing.  Gastrointestinal: Denied, gastro-esophageal reflux, melena, nausea and vomiting.  Genitourinary: Denied genitourinary symptoms including symptomatic vaginal discharge, pelvic relaxation issues, and urinary complaints.  Musculoskeletal: Denied musculoskeletal symptoms, stiffness, swelling, muscle weakness and myalgia.  Dermatologic: Denied dermatology symptoms, rash and scar.  Neurologic: Denied neurology symptoms, dizziness, headache, neck pain and syncope.  Psychiatric: Denied psychiatric symptoms, anxiety and depression.  Endocrine: Denied endocrine symptoms including hot flashes and night sweats.   Meds:   Current Outpatient Medications on File Prior to Visit  Medication Sig Dispense Refill   B-D 3CC LUER-LOK SYR 25GX5/8" 25G X 5/8" 3 ML MISC USE AS DIRECTED 50 each 12   BD DISP NEEDLES 25G X 5/8" MISC USE AS DIRECTED WITH B12 SHOTS     cyanocobalamin (VITAMIN B12) 1000 MCG/ML injection INJECT 1 ML INTO THE SKIN EVERY 6 DAYS 10 mL 0   EPINEPHrine 0.3 mg/0.3 mL IJ SOAJ injection INJECT INTRAMUSCULARLY AS DIRECTED 2 each 3   VIACTIV CALCIUM PLUS D 650-12.5-40 MG-MCG CHEW      Vitamin D, Ergocalciferol, (DRISDOL) 1.25 MG (50000 UNIT) CAPS capsule TAKE 1 CAPSULE BY MOUTH 1 TIME A WEEK 12 capsule 0   Current Facility-Administered Medications  on File Prior to Visit  Medication Dose Route Frequency  Provider Last Rate Last Admin   0.9 %  sodium chloride infusion  500 mL Intravenous Once Hilarie Fredrickson, MD         Objective:     Vitals:   03/24/23 1317 03/24/23 1327  BP: (!) 151/73 (!) 150/70  Pulse: 67     Filed Weights   03/24/23 1317  Weight: 131 lb 1.6 oz (59.5 kg)                Assessment:    G2P1011 Patient Active Problem List   Diagnosis Date Noted   Microscopic hematuria 09/18/2019   Postmenopausal 01/21/2019   Cough 08/21/2016   Endometriosis 04/25/2015   Personal history of reproductive and obstetrical problems 04/25/2015   HLD (hyperlipidemia) 04/25/2015   B12 deficiency 04/25/2015     1. Well woman exam with routine gynecological exam   2. Vaginal atrophy   3. Dyspareunia in female        Plan:            1.  Basic Screening Recommendations The basic screening recommendations for asymptomatic women were discussed with the patient during her visit.  The age-appropriate recommendations were discussed with her and the rational for the tests reviewed.  When I am informed by the patient that another primary care physician has previously obtained the age-appropriate tests and they are up-to-date, only outstanding tests are ordered and referrals given as necessary.  Abnormal results of tests will be discussed with her when all of her results are completed.  Routine preventative health maintenance measures emphasized: Exercise/Diet/Weight control, Tobacco Warnings, Alcohol/Substance use risks and Stress Management Mammogram scheduled for September. 2.  Continue Vivelle-Dot Orders No orders of the defined types were placed in this encounter.    Meds ordered this encounter  Medications   estradiol (VIVELLE-DOT) 0.05 MG/24HR patch    Sig: APPLY 1 PATCH(0.05 MG) TOPICALLY TO THE SKIN 2 TIMES A WEEK    Dispense:  24 patch    Refill:  3          F/U  Return in about 1 year (around 03/23/2024) for Annual Physical.  Elonda Husky, M.D. 03/24/2023 2:19  PM

## 2023-03-24 NOTE — Progress Notes (Signed)
Patients presents for annual exam today. She states she would like to continue HRT, refills sent. History of hysterectomy. Due for mammogram, scheduled for 04/02/23. Annual labs are preformed by PCP. She states no other questions or concerns at this time.

## 2023-04-02 ENCOUNTER — Ambulatory Visit
Admission: RE | Admit: 2023-04-02 | Discharge: 2023-04-02 | Disposition: A | Discharge status: Home / Self Care | Payer: Medicare Other | Source: Ambulatory Visit | Attending: Obstetrics and Gynecology | Admitting: Obstetrics and Gynecology

## 2023-04-02 DIAGNOSIS — Z1231 Encounter for screening mammogram for malignant neoplasm of breast: Secondary | ICD-10-CM

## 2023-04-14 ENCOUNTER — Other Ambulatory Visit: Payer: Self-pay | Admitting: Obstetrics and Gynecology

## 2023-04-14 DIAGNOSIS — N941 Unspecified dyspareunia: Secondary | ICD-10-CM

## 2023-04-14 DIAGNOSIS — N952 Postmenopausal atrophic vaginitis: Secondary | ICD-10-CM

## 2023-06-05 ENCOUNTER — Other Ambulatory Visit: Payer: Self-pay | Admitting: Family Medicine

## 2023-06-07 NOTE — Telephone Encounter (Signed)
Provider no longer at this practice, Dr. Sullivan Lone patient.  Requested Prescriptions  Pending Prescriptions Disp Refills   EPINEPHrine 0.3 mg/0.3 mL IJ SOAJ injection [Pharmacy Med Name: EPINEPHRINE 0.3MG  INJ 2 PACK] 2 each 3    Sig: INJECT INTRAMUSCULARY AS DIRECTED     Immunology: Antidotes Failed - 06/05/2023  3:29 AM      Failed - Valid encounter within last 12 months    Recent Outpatient Visits           1 year ago B12 deficiency   [facility] Bosie Clos, MD   1 year ago Annual physical exam   [facility] Bosie Clos, MD   1 year ago Dysuria   [facility] Bosie Clos, MD   2 years ago Medicare annual wellness visit, subsequent   [facility] Bosie Clos, MD   3 years ago Annual physical exam   [facility] Bosie Clos, MD       Future Appointments             In 1 week McGowan, Elana Alm [facility] [place]

## 2023-06-15 NOTE — Progress Notes (Signed)
07/22/2018 2:52 PM   Tracey Wolf [birth year] [id number]  Referring provider: Bosie Clos, MD [address] [place],  [place] [address]   Urological history: 1. High risk hematuria -former smoker -CTU 2018 NED -cysto 2018 NED -cysto 2022 - Whitish heaped up area, approximately 1 cm in which there is an embedded what looks like calcification or thrombosed vessel in the central portion of the trigone -bladder biopsy 2022 - UROTHELIUM WITH SQUAMOUS METAPLASIA, CYSTITIS CYSTICA, AND FOCAL AREA OF NON-SPECIFIC STROMAL HEMORRHAGE. - NEGATIVE FOR DYSPLASIA AND MALIGNANCY -seen by nephrology-no recommendations  No chief complaint on file.   HPI: Tracey Wolf is a 72 y.o. female who presents today for yearly follow up.   Previous records reviewed.   UA ***      PMH: Past Medical History:  Diagnosis Date   B12 deficiency    Endometriosis 04/2015   Hyperlipidemia    Postmenopausal     Surgical History: Past Surgical History:  Procedure Laterality Date   ABDOMINAL HYSTERECTOMY     CATARACT EXTRACTION W/PHACO Left 09/29/2021   Procedure: CATARACT EXTRACTION PHACO AND INTRAOCULAR LENS PLACEMENT (IOC) LEFT PANOPTIX TORIC;  Surgeon: Nevada Crane, MD;  Location: [facility];  Service: Ophthalmology;  Laterality: Left;  1.85 0:21.7   CATARACT EXTRACTION W/PHACO Right 10/13/2021   Procedure: CATARACT EXTRACTION PHACO AND INTRAOCULAR LENS PLACEMENT (IOC) RIGHT PANOPTIX TORIC 2.50 00:30.0;  Surgeon: Nevada Crane, MD;  Location: [facility];  Service: Ophthalmology;  Laterality: Right;   CERVICAL CONE BIOPSY     COLONOSCOPY     CYSTOSCOPY WITH BIOPSY N/A 05/05/2021   Procedure: CYSTOSCOPY WITH BLADDER BIOPSY;  Surgeon: Vanna Scotland, MD;  Location: [facility];  Service: Urology;  Laterality: N/A;   DILATION AND CURETTAGE OF UTERUS     TUBAL LIGATION      Home Medications:  Allergies as of 06/17/2023       Reactions   Bee Venom  Anaphylaxis   Iodinated Contrast Media Anaphylaxis   Iohexol Anaphylaxis    Desc: had severe reaction in october '05   swelling, sob and throat closing   Cefaclor    Egg-derived Products Nausea And Vomiting   Doxycycline Rash   Latex Rash        Medication List        Accurate as of June 15, 2023  2:52 PM. If you have any questions, ask your nurse or doctor.          B-D 3CC LUER-LOK SYR 25GX5/8" 25G X 5/8" 3 ML Misc Generic drug: SYRINGE-NEEDLE (DISP) 3 ML USE AS DIRECTED   BD Disp Needles 25G X 5/8" Misc Generic drug: NEEDLE (DISP) 25 G USE AS DIRECTED WITH B12 SHOTS   cyanocobalamin 1000 MCG/ML injection Commonly known as: VITAMIN B12 INJECT 1 ML INTO THE SKIN EVERY 6 DAYS   EPINEPHrine 0.3 mg/0.3 mL Soaj injection Commonly known as: EPI-PEN INJECT INTRAMUSCULARLY AS DIRECTED   estradiol 0.05 MG/24HR patch Commonly known as: VIVELLE-DOT APPLY 1 PATCH(0.05 MG) TOPICALLY TO THE SKIN 2 TIMES A WEEK   Viactiv Calcium Plus D 650-12.5-40 MG-MCG Chew Generic drug: Calcium-Vitamin D-Vitamin K   Vitamin D (Ergocalciferol) 1.25 MG (50000 UNIT) Caps capsule Commonly known as: DRISDOL TAKE 1 CAPSULE BY MOUTH 1 TIME A WEEK        Allergies:  Allergies  Allergen Reactions   Bee Venom Anaphylaxis   Iodinated Contrast Media Anaphylaxis   Iohexol Anaphylaxis     Desc:  had severe reaction in october '05   swelling, sob and throat closing    Cefaclor    Egg-Derived Products Nausea And Vomiting   Doxycycline Rash   Latex Rash    Family History: Family History  Problem Relation Age of Onset   Diabetes Mother    Heart attack Mother    COPD Father    Pulmonary embolism Father    Hyperlipidemia Sister    Hyperlipidemia Daughter    Hyperlipidemia Brother    Kidney disease Brother    Kidney Stones Brother    Kidney cancer Neg Hx    Prostate cancer Neg Hx    Colon cancer Neg Hx    Colon polyps Neg Hx    Esophageal cancer Neg Hx    Rectal cancer Neg Hx     Stomach cancer Neg Hx    Breast cancer Neg Hx     Social History:  reports that she quit smoking about 32 years ago. Her smoking use included cigarettes. She started smoking about 52 years ago. She has a 10 pack-year smoking history. She has never used smokeless tobacco. She reports current alcohol use. She reports that she does not use drugs.  ROS: For pertinent review of systems please refer to history of present illness   Physical Exam: There were no vitals taken for this visit.  Constitutional:  Well nourished. Alert and oriented, No acute distress. HEENT: [place] AT, moist mucus membranes.  Trachea midline, no masses. Cardiovascular: No clubbing, cyanosis, or edema. Respiratory: Normal respiratory effort, no increased work of breathing. GU: No CVA tenderness.  No bladder fullness or masses.  Recession of labia minora, dry, pale vulvar vaginal mucosa and loss of mucosal ridges and folds.  Normal urethral meatus, no lesions, no prolapse, no discharge.   No urethral masses, tenderness and/or tenderness. No bladder fullness, tenderness or masses. *** vagina mucosa, *** estrogen effect, no discharge, no lesions, *** pelvic support, *** cystocele and *** rectocele noted.  No cervical motion tenderness.  Uterus is freely mobile and non-fixed.  No adnexal/parametria masses or tenderness noted.  Anus and perineum are without rashes or lesions.   ***  Neurologic: Grossly intact, no focal deficits, moving all 4 extremities. Psychiatric: Normal mood and affect.    Laboratory Data: CBC w/auto Differential (3 Part) Order: [id number] Component Ref Range & Units 3 mo ago  WBC (White Blood Cell Count) 4.1 - 10.2 10^3/uL 6.0  RBC (Red Blood Cell Count) 4.04 - 5.48 10^6/uL 4.73  Hemoglobin 12.0 - 15.0 gm/dL 40.9  Hematocrit 81.1 - 47.0 % 43.0  MCV (Mean Corpuscular Volume) 80.0 - 100.0 fl 90.9  MCH (Mean Corpuscular Hemoglobin) 27.0 - 31.2 pg 28.3  MCHC (Mean Corpuscular Hemoglobin  Concentration) 32.0 - 36.0 gm/dL 91.4 Low   Platelet Count 150 - 450 10^3/uL 244  RDW-CV (Red Cell Distribution Width) 11.6 - 14.8 % 14.1  MPV (Mean Platelet Volume) 9.4 - 12.4 fl 8.3 Low   Neutrophils 1.50 - 7.80 10^3/uL 4.30  Lymphocytes 1.00 - 3.60 10^3/uL 1.40  Mixed Count 0.10 - 0.90 10^3/uL 0.30  Neutrophil % 32.0 - 70.0 % 71.4 High   Lymphocyte % 10.0 - 50.0 % 23.6  Mixed % 3.0 - 14.4 % 5.0  Resulting Agency [facility] - [facility]   Specimen Collected: 03/05/23 08:41   Performed by: [facility] Last Resulted: 03/05/23 09:57  Received From: [facility]  Result Received: 03/19/23 15:27   Comprehensive Metabolic Panel (CMP) Order:  [id number] Component Ref Range & Units 3 mo ago  Glucose 70 - 110 mg/dL 83  Sodium 440 - 102 mmol/L 140  Potassium 3.6 - 5.1 mmol/L 4.4  Chloride 97 - 109 mmol/L 103  Carbon Dioxide (CO2) 22.0 - 32.0 mmol/L 27.5  Urea Nitrogen (BUN) 7 - 25 mg/dL 10  Creatinine 0.6 - 1.1 mg/dL 1.0  Glomerular Filtration Rate (eGFR) >60 mL/min/1.73sq m 60 Low   Comment: CKD-EPI (2021) does not include patient's race in the calculation of eGFR.  Monitoring changes of plasma creatinine and eGFR over time is useful for monitoring kidney function.  Interpretive Ranges for eGFR (CKD-EPI 2021):  eGFR:       >60 mL/min/1.73 sq. m - Normal eGFR:       30-59 mL/min/1.73 sq. m - Moderately Decreased eGFR:       15-29 mL/min/1.73 sq. m  - Severely Decreased eGFR:       < 15 mL/min/1.73 sq. m  - Kidney Failure   Note: These eGFR calculations do not apply in acute situations when eGFR is changing rapidly or patients on dialysis.  Calcium 8.7 - 10.3 mg/dL 9.0  AST 8 - 39 U/L 11  ALT 5 - 38 U/L 9  Alk Phos (alkaline Phosphatase) 34 - 104 U/L 114 High   Albumin 3.5 - 4.8 g/dL 4.4  Bilirubin, Total 0.3 - 1.2 mg/dL 0.5  Protein, Total 6.1 - 7.9 g/dL 6.7  A/G Ratio 1.0 - 5.0 gm/dL 1.9  Resulting Agency [facility]   Specimen Collected: 03/05/23 08:41   Performed by: [facility] Last Resulted: 03/05/23 16:20  Received From: [facility]  Result Received: 03/19/23 15:27  Urinalysis See EPIC and HPI  I have reviewed the labs.    Pertinent imaging N/A  Assessment & Plan:   1. High risk hematuria -former smoker -work up 2018 - NED -cysto (2022) - cystitis cystica -no reports of gross heme -UA negative for micro heme  No follow-ups on file.  These notes generated with voice recognition software. I apologize for typographical errors.  Michiel Cowboy, PA-C  [facility] [address] [place], [place] [address] [phone]

## 2023-06-17 ENCOUNTER — Encounter: Payer: Self-pay | Admitting: Urology

## 2023-06-17 ENCOUNTER — Ambulatory Visit: Payer: Medicare Other | Admitting: Urology

## 2023-06-17 VITALS — BP 128/71 | HR 6

## 2023-06-17 DIAGNOSIS — Z87898 Personal history of other specified conditions: Secondary | ICD-10-CM | POA: Diagnosis not present

## 2023-06-17 DIAGNOSIS — Z09 Encounter for follow-up examination after completed treatment for conditions other than malignant neoplasm: Secondary | ICD-10-CM

## 2023-06-17 DIAGNOSIS — R319 Hematuria, unspecified: Secondary | ICD-10-CM

## 2023-06-17 LAB — URINALYSIS, COMPLETE
Bilirubin, UA: NEGATIVE
Glucose, UA: NEGATIVE
Ketones, UA: NEGATIVE
Leukocytes,UA: NEGATIVE
Nitrite, UA: NEGATIVE
Protein,UA: NEGATIVE
RBC, UA: NEGATIVE
Specific Gravity, UA: 1.01 (ref 1.005–1.030)
Urobilinogen, Ur: 0.2 mg/dL (ref 0.2–1.0)
pH, UA: 6 (ref 5.0–7.5)

## 2023-06-17 LAB — MICROSCOPIC EXAMINATION

## 2023-08-10 ENCOUNTER — Encounter: Payer: Self-pay | Admitting: Ophthalmology

## 2023-08-18 NOTE — Discharge Instructions (Signed)

## 2023-08-20 ENCOUNTER — Encounter: Payer: Self-pay | Admitting: Ophthalmology

## 2023-08-20 ENCOUNTER — Encounter
Admission: RE | Disposition: A | Discharge status: Home / Self Care | Payer: Self-pay | Source: Home / Self Care | Attending: Ophthalmology

## 2023-08-20 ENCOUNTER — Other Ambulatory Visit: Payer: Self-pay

## 2023-08-20 ENCOUNTER — Ambulatory Visit: Payer: Medicare Other | Admitting: General Practice

## 2023-08-20 ENCOUNTER — Ambulatory Visit
Admission: RE | Admit: 2023-08-20 | Discharge: 2023-08-20 | Disposition: A | Discharge status: Home / Self Care | Payer: Medicare Other | Attending: Ophthalmology | Admitting: Ophthalmology

## 2023-08-20 DIAGNOSIS — H02834 Dermatochalasis of left upper eyelid: Secondary | ICD-10-CM | POA: Insufficient documentation

## 2023-08-20 DIAGNOSIS — Z87891 Personal history of nicotine dependence: Secondary | ICD-10-CM | POA: Diagnosis not present

## 2023-08-20 DIAGNOSIS — H02831 Dermatochalasis of right upper eyelid: Secondary | ICD-10-CM | POA: Insufficient documentation

## 2023-08-20 HISTORY — PX: BROW LIFT: SHX178

## 2023-08-20 SURGERY — BLEPHAROPLASTY
Anesthesia: General | Laterality: Bilateral

## 2023-08-20 MED ORDER — ERYTHROMYCIN 5 MG/GM OP OINT
TOPICAL_OINTMENT | OPHTHALMIC | Status: DC | PRN
  Administered 2023-08-20: 1 via OPHTHALMIC

## 2023-08-20 MED ORDER — LIDOCAINE HCL (PF) 2 % IJ SOLN
INTRAMUSCULAR | Status: AC
  Filled 2023-08-20: qty 5

## 2023-08-20 MED ORDER — LACTATED RINGERS IV SOLN: INTRAVENOUS | Status: DC

## 2023-08-20 MED ORDER — LIDOCAINE-EPINEPHRINE 2 %-1:100000 IJ SOLN
INTRAMUSCULAR | Status: DC | PRN
  Administered 2023-08-20: 2 mL via OPHTHALMIC

## 2023-08-20 MED ORDER — OXYCODONE HCL 5 MG/5ML PO SOLN: 5.0000 mg | Freq: Once | ORAL | Status: DC | PRN

## 2023-08-20 MED ORDER — PROPOFOL 500 MG/50ML IV EMUL
INTRAVENOUS | Status: DC | PRN
  Administered 2023-08-20: 50 ug/kg/min via INTRAVENOUS

## 2023-08-20 MED ORDER — TETRACAINE HCL 0.5 % OP SOLN
OPHTHALMIC | Status: DC | PRN
  Administered 2023-08-20: 2 [drp] via OPHTHALMIC

## 2023-08-20 MED ORDER — LACTATED RINGERS IV SOLN: INTRAVENOUS | Status: DC | PRN

## 2023-08-20 MED ORDER — ONDANSETRON HCL 4 MG/2ML IJ SOLN
INTRAMUSCULAR | Status: AC
Start: 2023-08-20 — End: ?
  Filled 2023-08-20: qty 2

## 2023-08-20 MED ORDER — OXYCODONE HCL 5 MG PO TABS: 5.0000 mg | ORAL_TABLET | Freq: Once | ORAL | Status: DC | PRN

## 2023-08-20 MED ORDER — PROPOFOL 10 MG/ML IV BOLUS
INTRAVENOUS | Status: AC
  Filled 2023-08-20: qty 20

## 2023-08-20 MED ORDER — DROPERIDOL 2.5 MG/ML IJ SOLN: 0.6250 mg | Freq: Once | INTRAMUSCULAR | Status: DC | PRN

## 2023-08-20 MED ORDER — FENTANYL CITRATE PF 50 MCG/ML IJ SOSY
25.0000 ug | PREFILLED_SYRINGE | INTRAMUSCULAR | Status: DC | PRN
Start: 2023-08-20 — End: 2023-08-20

## 2023-08-20 MED ORDER — FENTANYL CITRATE (PF) 100 MCG/2ML IJ SOLN
INTRAMUSCULAR | Status: AC
  Filled 2023-08-20: qty 2

## 2023-08-20 MED ORDER — LIDOCAINE HCL (CARDIAC) PF 100 MG/5ML IV SOSY
PREFILLED_SYRINGE | INTRAVENOUS | Status: DC | PRN
  Administered 2023-08-20: 60 mg via INTRAVENOUS

## 2023-08-20 MED ORDER — LIDOCAINE HCL (PF) 2 % IJ SOLN
INTRAMUSCULAR | Status: AC
  Filled 2023-08-20: qty 10

## 2023-08-20 MED ORDER — ERYTHROMYCIN 5 MG/GM OP OINT: TOPICAL_OINTMENT | OPHTHALMIC | 2 refills | Status: AC

## 2023-08-20 MED ORDER — BSS IO SOLN
INTRAOCULAR | Status: DC | PRN
  Administered 2023-08-20: 15 mL

## 2023-08-20 MED ORDER — MIDAZOLAM HCL 2 MG/2ML IJ SOLN
INTRAMUSCULAR | Status: AC
  Filled 2023-08-20: qty 2

## 2023-08-20 MED ORDER — MIDAZOLAM HCL 2 MG/2ML IJ SOLN
INTRAMUSCULAR | Status: DC | PRN
  Administered 2023-08-20 (×2): 1 mg via INTRAVENOUS

## 2023-08-20 MED ORDER — FENTANYL CITRATE (PF) 100 MCG/2ML IJ SOLN
INTRAMUSCULAR | Status: DC | PRN
  Administered 2023-08-20: 25 ug via INTRAVENOUS

## 2023-08-20 MED ORDER — TRAMADOL HCL 50 MG PO TABS: ORAL_TABLET | ORAL | 0 refills | Status: AC

## 2023-08-20 MED ORDER — ONDANSETRON HCL 4 MG/2ML IJ SOLN
INTRAMUSCULAR | Status: DC | PRN
  Administered 2023-08-20: 4 mg via INTRAVENOUS

## 2023-08-20 MED ORDER — ACETAMINOPHEN 10 MG/ML IV SOLN
1000.0000 mg | Freq: Once | INTRAVENOUS | Status: DC | PRN
Start: 2023-08-20 — End: 2023-08-20

## 2023-08-20 SURGICAL SUPPLY — 19 items
APPLICATOR COTTON TIP WD 3 STR (MISCELLANEOUS) ×1 IMPLANT
BLADE SURG 15 STRL LF DISP TIS (BLADE) ×1 IMPLANT
CORD BIP STRL DISP 12FT (MISCELLANEOUS) ×1 IMPLANT
GAUZE SPONGE 2X2 STRL 8-PLY (GAUZE/BANDAGES/DRESSINGS) ×10 IMPLANT
GAUZE SPONGE 4X4 12PLY STRL (GAUZE/BANDAGES/DRESSINGS) ×1 IMPLANT
GLOVE SURG UNDER POLY LF SZ7 (GLOVE) ×2 IMPLANT
GOWN STRL REUS W/ TWL LRG LVL3 (GOWN DISPOSABLE) ×1 IMPLANT
MARKER SKIN XFINE TIP W/RULER (MISCELLANEOUS) ×1 IMPLANT
NDL FILTER BLUNT 18X1 1/2 (NEEDLE) ×1 IMPLANT
NDL HYPO 30X.5 LL (NEEDLE) ×2 IMPLANT
NEEDLE FILTER BLUNT 18X1 1/2 (NEEDLE) ×1
NEEDLE HYPO 30X.5 LL (NEEDLE) ×2
PACK ENT CUSTOM (PACKS) ×1 IMPLANT
SOL PREP PVP 2OZ (MISCELLANEOUS) ×1
SOLUTION PREP PVP 2OZ (MISCELLANEOUS) ×1 IMPLANT
SUT GUT PLAIN 6-0 1X18 ABS (SUTURE) ×1 IMPLANT
SYR 10ML LL (SYRINGE) ×1 IMPLANT
SYR 3ML LL SCALE MARK (SYRINGE) ×1 IMPLANT
WATER STERILE IRR 250ML POUR (IV SOLUTION) ×1 IMPLANT

## 2023-08-20 NOTE — Op Note (Signed)
Preoperative Diagnosis:  Visually significant dermatochalasis bilateral  Upper Eyelid(s)  Postoperative Diagnosis:  Same.  Procedure(s) Performed:   Upper eyelid blepharoplasty with excess skin excision  bilateral  Upper Eyelid(s)  Surgeon: Hubbard Robinson. Ether Griffins, M.D.  Assistants: none  Anesthesia: MAC  Specimens: None.  Estimated Blood Loss: Minimal.  Complications: None.  Operative Findings: None Dictated  Procedure:   Allergies were reviewed and the patient is allergic to Bee venom, Iodinated contrast media, Iohexol, Cefaclor, Egg-derived products, Doxycycline, and Latex.   After the risks, benefits, complications and alternatives were discussed with the patient, appropriate informed consent was obtained and the patient was brought to the operating suite. The patient was reclined supine and a timeout was conducted.  The patient was then sedated.  Local anesthetic consisting of a 50-50 mixture of 2% lidocaine with epinephrine and 0.75% bupivacaine with added Hylenex was injected subcutaneously to both  upper eyelid(s). After adequate local was instilled, the patient was prepped and draped in the usual sterile fashion for eyelid surgery.   Attention was turned to the upper eyelids. A 9mm upper eyelid crease incision line was marked with calipers on both  upper eyelid(s).  A pinch test was used to estimate the amount of excess skin to remove and this was marked in standard blepharoplasty style fashion. Attention was turned to the  right  upper eyelid. A #15 blade was used to open the premarked incision line. A Skin and muscle flap was excised and hemostasis was obtained with bipolar cautery.   Attention was then turned to the opposite eyelid where the same procedure was performed in the same manner. Hemostasis was obtained with bipolar cautery throughout. All incisions were then closed with a combination of running and interrupted 6-0 fast absorbing plain suture. The patient tolerated the  procedure well.  Erythromycin ophthalmic ointment was applied to her incision sites, followed by ice packs. She was taken to the recovery area where she recovered without difficulty.  Post-Op Plan/Instructions:  The patient was instructed to use ice packs frequently for the next 48 hours. She was instructed to use Erythromycin ophthalmic ointment on her incisions 4 times a day for the next 12 to 14 days. She was given a prescription for tramadol (or similar) for pain control should Tylenol not be effective. She was asked to to follow up in 2-3 weeks' time at the [facility] in [place], [place] or sooner as needed for problems.  Dontez Hauss M. Ether Griffins, M.D. Ophthalmology

## 2023-08-20 NOTE — Interval H&P Note (Signed)
History and Physical Interval Note:  08/20/2023 8:11 AM  Tracey Wolf  has presented today for surgery, with the diagnosis of H02.831 Dermatochalasis of Right Upper Eyelid H02.834 Dermatochalasis of Left Upper Eyelid.  The various methods of treatment have been discussed with the patient and family. After consideration of risks, benefits and other options for treatment, the patient has consented to  Procedure(s): BLEPHAROPLASTY UPPER EYELID; W/EXCESS SKIN BILATERAL (Bilateral) as a surgical intervention.  The patient's history has been reviewed, patient examined, no change in status, stable for surgery.  I have reviewed the patient's chart and labs.  Questions were answered to the patient's satisfaction.     Ether Griffins, Adin Laker M

## 2023-08-20 NOTE — Transfer of Care (Signed)
Immediate Anesthesia Transfer of Care Note  [name]  Procedure(s) Performed: BLEPHAROPLASTY UPPER EYELID; W/EXCESS SKIN BILATERAL (Bilateral)  Patient Location: PACU  Anesthesia Type: General  Level of Consciousness: awake, alert  and patient cooperative  Airway and Oxygen Therapy: Patient Spontanous Breathing and Patient connected to supplemental oxygen  Post-op Assessment: Post-op Vital signs reviewed, Patient's Cardiovascular Status Stable, Respiratory Function Stable, Patent Airway and No signs of Nausea or vomiting  Post-op Vital Signs: Reviewed and stable  Complications: No notable events documented.

## 2023-08-20 NOTE — Anesthesia Postprocedure Evaluation (Signed)
Anesthesia Post Note  [name]  Procedure(s) Performed: BLEPHAROPLASTY UPPER EYELID; W/EXCESS SKIN BILATERAL (Bilateral)  Patient location during evaluation: PACU Anesthesia Type: General Level of consciousness: awake and alert Pain management: pain level controlled Vital Signs Assessment: post-procedure vital signs reviewed and stable Respiratory status: spontaneous breathing, nonlabored ventilation, respiratory function stable and patient connected to nasal cannula oxygen Cardiovascular status: blood pressure returned to baseline and stable Postop Assessment: no apparent nausea or vomiting Anesthetic complications: no   No notable events documented.   Last Vitals:  Vitals:   08/20/23 0910 08/20/23 0913  BP:  (!) 114/59  Pulse: (!) 59 74  Resp: 15 14  Temp:    SpO2: 100% 100%    Last Pain:  Vitals:   08/20/23 0913  TempSrc:   PainSc: 0-No pain                 Yevette Edwards

## 2023-08-20 NOTE — H&P (Signed)
[facility]: [facility]  Primary Care Physician:  Bosie Clos, MD Ophthalmologist: Dr. Hubbard Robinson. Ether Griffins, M.D.  Pre-Procedure History & Physical: HPI:  Tracey Wolf is a 73 y.o. female here for periocular surgery.   Past Medical History:  Diagnosis Date   B12 deficiency    Endometriosis 04/2015   Hyperlipidemia    Postmenopausal     Past Surgical History:  Procedure Laterality Date   ABDOMINAL HYSTERECTOMY     CATARACT EXTRACTION W/PHACO Left 09/29/2021   Procedure: CATARACT EXTRACTION PHACO AND INTRAOCULAR LENS PLACEMENT (IOC) LEFT PANOPTIX TORIC;  Surgeon: Nevada Crane, MD;  Location: [facility];  Service: Ophthalmology;  Laterality: Left;  1.85 0:21.7   CATARACT EXTRACTION W/PHACO Right 10/13/2021   Procedure: CATARACT EXTRACTION PHACO AND INTRAOCULAR LENS PLACEMENT (IOC) RIGHT PANOPTIX TORIC 2.50 00:30.0;  Surgeon: Nevada Crane, MD;  Location: [facility];  Service: Ophthalmology;  Laterality: Right;   CERVICAL CONE BIOPSY     COLONOSCOPY     CYSTOSCOPY WITH BIOPSY N/A 05/05/2021   Procedure: CYSTOSCOPY WITH BLADDER BIOPSY;  Surgeon: Vanna Scotland, MD;  Location: [facility] ORS;  Service: Urology;  Laterality: N/A;   DILATION AND CURETTAGE OF UTERUS     TUBAL LIGATION      Prior to Admission medications   Medication Sig Start Date End Date Taking? Authorizing Provider  B-D 3CC LUER-LOK SYR 25GX5/8" 25G X 5/8" 3 ML MISC USE AS DIRECTED 11/17/21  Yes Bosie Clos, MD  cyanocobalamin (VITAMIN B12) 1000 MCG/ML injection INJECT 1 ML INTO THE SKIN EVERY 6 DAYS 07/17/22  Yes Malva Limes, MD  EPINEPHrine 0.3 mg/0.3 mL IJ SOAJ injection INJECT INTRAMUSCULARLY AS DIRECTED 06/02/22  Yes Malva Limes, MD  estradiol (VIVELLE-DOT) 0.05 MG/24HR patch APPLY 1 PATCH(0.05 MG) TOPICALLY TO THE SKIN 2 TIMES A WEEK 04/14/23  Yes Linzie Collin, MD  VIACTIV CALCIUM PLUS D 650-12.5-40 MG-MCG CHEW  09/10/21  Yes [provider]   Vitamin D, Ergocalciferol, (DRISDOL) 1.25 MG (50000 UNIT) CAPS capsule TAKE 1 CAPSULE BY MOUTH 1 TIME A WEEK 09/21/22  Yes Alfredia Ferguson, PA-C  BD DISP NEEDLES 25G X 5/8" MISC USE AS DIRECTED WITH B12 SHOTS 10/17/18   [provider]    Allergies as of 04/14/2023 - Review Complete 03/24/2023  Allergen Reaction Noted   Bee venom Anaphylaxis 04/25/2015   Iodinated contrast media Anaphylaxis 04/25/2015   Iohexol Anaphylaxis 08/07/2004   Cefaclor  04/25/2015   Egg-derived products Nausea And Vomiting 04/22/2015   Doxycycline Rash 04/25/2015   Latex Rash 06/14/2017    Family History  Problem Relation Age of Onset   Diabetes Mother    Heart attack Mother    COPD Father    Pulmonary embolism Father    Hyperlipidemia Sister    Hyperlipidemia Daughter    Hyperlipidemia Brother    Kidney disease Brother    Kidney Stones Brother    Kidney cancer Neg Hx    Prostate cancer Neg Hx    Colon cancer Neg Hx    Colon polyps Neg Hx    Esophageal cancer Neg Hx    Rectal cancer Neg Hx    Stomach cancer Neg Hx    Breast cancer Neg Hx     Social History   Socioeconomic History   Marital status: Married    Spouse name: Not on file   Number of children: Not on file   Years of education: Not on file   Highest education  level: Not on file  Occupational History   Not on file  Tobacco Use   Smoking status: Former    Current packs/day: 0.00    Average packs/day: 0.5 packs/day for 20.0 years (10.0 ttl pk-yrs)    Types: Cigarettes    Start date: 08/03/1970    Quit date: 08/03/1990    Years since quitting: 33.0   Smokeless tobacco: Never  Vaping Use   Vaping status: Never Used  Substance and Sexual Activity   Alcohol use: Yes    Alcohol/week: 0.0 standard drinks of alcohol    Comment: 2 per month   Drug use: No   Sexual activity: Yes    Birth control/protection: Surgical  Other Topics Concern   Not on file  Social History Narrative   Not on file   Social Drivers of Health    Financial Resource Strain: Low Risk  (11/10/2021)   Overall Financial Resource Strain (CARDIA)    Difficulty of Paying Living Expenses: Not hard at all  Food Insecurity: No Food Insecurity (11/10/2021)   Hunger Vital Sign    Worried About Running Out of Food in the Last Year: Never true    Ran Out of Food in the Last Year: Never true  Transportation Needs: No Transportation Needs (11/10/2021)   PRAPARE - Administrator, Civil Service (Medical): No    Lack of Transportation (Non-Medical): No  Physical Activity: Insufficiently Active (11/10/2021)   Exercise Vital Sign    Days of Exercise per Week: 3 days    Minutes of Exercise per Session: 40 min  Stress: Stress Concern Present (11/10/2021)   [employer] of Occupational Health - Occupational Stress Questionnaire    Feeling of Stress : Very much  Social Connections: Socially Integrated (11/10/2021)   Social Connection and Isolation Panel [NHANES]    Frequency of Communication with Friends and Family: More than three times a week    Frequency of Social Gatherings with Friends and Family: More than three times a week    Attends Religious Services: 1 to 4 times per year    Active Member of [employer] or Organizations: Yes    Attends Banker Meetings: 1 to 4 times per year    Marital Status: Married  Catering manager Violence: Not At Risk (11/10/2021)   Humiliation, Afraid, Rape, and Kick questionnaire    Fear of Current or Ex-Partner: No    Emotionally Abused: No    Physically Abused: No    Sexually Abused: No    Review of Systems: See HPI, otherwise negative ROS  Physical Exam: BP 139/67   Temp (!) 97.3 F (36.3 C) (Temporal)   Resp 14   Ht 5' 7.01" (1.702 m)   Wt 59.1 kg   SpO2 99%   BMI 20.42 kg/m  General:   Alert and cooperative in NAD Head:  Normocephalic and atraumatic. Respiratory:  Normal work of breathing.  Impression/Plan: Tracey Wolf is here for periocular surgery.  Risks,  benefits, limitations, and alternatives regarding surgery have been reviewed with the patient.  Questions have been answered.  All parties agreeable.   Imagene Riches, MD  08/20/2023, 8:11 AM

## 2023-08-20 NOTE — Anesthesia Preprocedure Evaluation (Signed)
Anesthesia Evaluation  Patient identified by MRN, date of birth, ID band Patient awake    Reviewed: Allergy & Precautions, H&P , NPO status , Patient's Chart, lab work & pertinent test results, reviewed documented beta blocker date and time   Airway Mallampati: II   Neck ROM: full    Dental  (+) Poor Dentition   Pulmonary neg pulmonary ROS, former smoker   Pulmonary exam normal        Cardiovascular Exercise Tolerance: Good negative cardio ROS Normal cardiovascular exam Rhythm:regular Rate:Normal     Neuro/Psych negative neurological ROS  negative psych ROS   GI/Hepatic negative GI ROS, Neg liver ROS,,,  Endo/Other  negative endocrine ROS    Renal/GU negative Renal ROS  negative genitourinary   Musculoskeletal   Abdominal   Peds  Hematology negative hematology ROS (+)   Anesthesia Other Findings Past Medical History: No date: B12 deficiency 04/2015: Endometriosis No date: Hyperlipidemia No date: Postmenopausal Past Surgical History: No date: ABDOMINAL HYSTERECTOMY 09/29/2021: CATARACT EXTRACTION W/PHACO; Left     Comment:  Procedure: CATARACT EXTRACTION PHACO AND INTRAOCULAR               LENS PLACEMENT (IOC) LEFT PANOPTIX TORIC;  Surgeon: Nevada Crane, MD;  Location: [facility];                Service: Ophthalmology;  Laterality: Left;  1.85 0:21.7 10/13/2021: CATARACT EXTRACTION W/PHACO; Right     Comment:  Procedure: CATARACT EXTRACTION PHACO AND INTRAOCULAR               LENS PLACEMENT (IOC) RIGHT PANOPTIX TORIC 2.50 00:30.0;                Surgeon: Nevada Crane, MD;  Location: [facility];  Service: Ophthalmology;  Laterality:               Right; No date: CERVICAL CONE BIOPSY No date: COLONOSCOPY 05/05/2021: CYSTOSCOPY WITH BIOPSY; N/A     Comment:  Procedure: CYSTOSCOPY WITH BLADDER BIOPSY;  Surgeon:               Vanna Scotland, MD;   Location: [facility] ORS;  Service:               Urology;  Laterality: N/A; No date: DILATION AND CURETTAGE OF UTERUS No date: TUBAL LIGATION BMI    Body Mass Index: 20.42 kg/m     Reproductive/Obstetrics negative OB ROS                             Anesthesia Physical Anesthesia Plan  ASA: 2  Anesthesia Plan: General   Post-op Pain Management:    Induction:   PONV Risk Score and Plan: 4 or greater  Airway Management Planned:   Additional Equipment:   Intra-op Plan:   Post-operative Plan:   Informed Consent: I have reviewed the patients History and Physical, chart, labs and discussed the procedure including the risks, benefits and alternatives for the proposed anesthesia with the patient or authorized representative who has indicated his/her understanding and acceptance.     Dental Advisory Given  Plan Discussed with: CRNA  Anesthesia Plan Comments:        Anesthesia Quick Evaluation

## 2023-08-21 ENCOUNTER — Encounter: Payer: Self-pay | Admitting: Ophthalmology

## 2024-01-03 ENCOUNTER — Other Ambulatory Visit: Payer: Self-pay | Admitting: Otolaryngology

## 2024-01-03 DIAGNOSIS — R42 Dizziness and giddiness: Secondary | ICD-10-CM

## 2024-01-13 ENCOUNTER — Ambulatory Visit
Admission: RE | Admit: 2024-01-13 | Discharge: 2024-01-13 | Disposition: A | Discharge status: Home / Self Care | Source: Ambulatory Visit | Attending: Otolaryngology | Admitting: Otolaryngology

## 2024-01-13 DIAGNOSIS — R42 Dizziness and giddiness: Secondary | ICD-10-CM

## 2024-01-17 ENCOUNTER — Telehealth: Payer: Self-pay

## 2024-01-17 MED ORDER — PREDNISONE 50 MG PO TABS: ORAL_TABLET | ORAL | 0 refills | Status: DC

## 2024-01-17 MED ORDER — DIPHENHYDRAMINE HCL 50 MG PO TABS: ORAL_TABLET | ORAL | 0 refills | Status: DC

## 2024-01-24 ENCOUNTER — Ambulatory Visit
Admission: RE | Admit: 2024-01-24 | Discharge: 2024-01-24 | Disposition: A | Discharge status: Home / Self Care | Source: Ambulatory Visit | Attending: Otolaryngology | Admitting: Otolaryngology

## 2024-01-24 MED ORDER — GADOPICLENOL 0.5 MMOL/ML IV SOLN
7.5000 mL | Freq: Once | INTRAVENOUS | Status: AC | PRN
  Administered 2024-01-24: 6 mL via INTRAVENOUS

## 2024-03-13 ENCOUNTER — Other Ambulatory Visit: Payer: Self-pay | Admitting: Obstetrics and Gynecology

## 2024-03-13 DIAGNOSIS — N941 Unspecified dyspareunia: Secondary | ICD-10-CM

## 2024-03-13 DIAGNOSIS — N952 Postmenopausal atrophic vaginitis: Secondary | ICD-10-CM

## 2024-03-15 ENCOUNTER — Other Ambulatory Visit: Payer: Self-pay | Admitting: Family Medicine

## 2024-03-15 DIAGNOSIS — Z1231 Encounter for screening mammogram for malignant neoplasm of breast: Secondary | ICD-10-CM

## 2024-03-28 ENCOUNTER — Other Ambulatory Visit: Payer: Self-pay | Admitting: Internal Medicine

## 2024-03-28 DIAGNOSIS — E7801 Familial hypercholesterolemia: Secondary | ICD-10-CM

## 2024-04-11 ENCOUNTER — Ambulatory Visit: Admitting: Obstetrics and Gynecology

## 2024-04-12 ENCOUNTER — Ambulatory Visit
Admission: RE | Admit: 2024-04-12 | Discharge: 2024-04-12 | Disposition: A | Discharge status: Home / Self Care | Source: Ambulatory Visit

## 2024-04-12 DIAGNOSIS — Z1231 Encounter for screening mammogram for malignant neoplasm of breast: Secondary | ICD-10-CM

## 2024-04-14 ENCOUNTER — Other Ambulatory Visit: Payer: Self-pay | Admitting: Obstetrics and Gynecology

## 2024-04-14 DIAGNOSIS — N952 Postmenopausal atrophic vaginitis: Secondary | ICD-10-CM

## 2024-04-14 DIAGNOSIS — N941 Unspecified dyspareunia: Secondary | ICD-10-CM

## 2024-05-08 ENCOUNTER — Ambulatory Visit: Admitting: Licensed Practical Nurse

## 2024-05-08 ENCOUNTER — Encounter: Payer: Self-pay | Admitting: Licensed Practical Nurse

## 2024-05-08 VITALS — BP 153/76 | HR 69 | Ht 67.0 in | Wt 132.4 lb

## 2024-05-08 DIAGNOSIS — Z90722 Acquired absence of ovaries, bilateral: Secondary | ICD-10-CM

## 2024-05-08 DIAGNOSIS — Z01419 Encounter for gynecological examination (general) (routine) without abnormal findings: Secondary | ICD-10-CM | POA: Diagnosis not present

## 2024-05-08 DIAGNOSIS — Z90710 Acquired absence of both cervix and uterus: Secondary | ICD-10-CM | POA: Diagnosis not present

## 2024-05-08 DIAGNOSIS — N951 Menopausal and female climacteric states: Secondary | ICD-10-CM

## 2024-05-08 MED ORDER — ESTRADIOL 0.0375 MG/24HR TD PTTW: 1.0000 | MEDICATED_PATCH | TRANSDERMAL | 12 refills | Status: AC

## 2024-05-08 NOTE — Progress Notes (Signed)
 Gynecology Annual Exam  PCP: Bertrum Charlie CROME, MD  Chief Complaint: No chief complaint on file.   History of Present Illness:Patient is a 73 y.o. G2P1011 presents for annual exam. The patient has no complaints today.  Her Blood pressure is noted to be high, reports her BP tend to be high in the office, but normal at home, she is being followed closely by a cardiologist and her PCP for other  medical conditions. They have recommended blood pressure medication in the past   LMP: No LMP recorded. Patient has had a hysterectomy. Pt went through menopause in her mid 6's, she was on HRT, there was a 3 year period where she was not on HRT, but then she was restarted on it by Dr Janit.    The patient is sexually active. She has no dyspareunia-estrogen therapy helps.  The patient does perform self breast exams.  There is no notable family history of breast or ovarian cancer in her family.  The patient wears seatbelts: yes.   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    The patient denies current symptoms of depression.    Lives with her husband and 3 dogs, feels safe Likes to read, cook and garden in her spare time PPC sees regularly Dental goes every 6 months Eye exam up to date, had cataracts   Review of Systems: ROS  Past Medical History:  Patient Active Problem List   Diagnosis Date Noted   Microscopic hematuria 09/18/2019   Postmenopausal 01/21/2019   Cough 08/21/2016   Endometriosis 04/25/2015   Personal history of reproductive and obstetrical problems 04/25/2015   HLD (hyperlipidemia) 04/25/2015   B12 deficiency 04/25/2015    Past Surgical History:  Past Surgical History:  Procedure Laterality Date   ABDOMINAL HYSTERECTOMY     BROW LIFT Bilateral 08/20/2023   Procedure: BLEPHAROPLASTY UPPER EYELID; W/EXCESS SKIN BILATERAL;  Surgeon: Ashley Greig HERO, MD;  Location: [facility];  Service:  Ophthalmology;  Laterality: Bilateral;   CATARACT EXTRACTION W/PHACO Left 09/29/2021   Procedure: CATARACT EXTRACTION PHACO AND INTRAOCULAR LENS PLACEMENT (IOC) LEFT PANOPTIX TORIC;  Surgeon: Myrna Adine Anes, MD;  Location: [facility];  Service: Ophthalmology;  Laterality: Left;  1.85 0:21.7   CATARACT EXTRACTION W/PHACO Right 10/13/2021   Procedure: CATARACT EXTRACTION PHACO AND INTRAOCULAR LENS PLACEMENT (IOC) RIGHT PANOPTIX TORIC 2.50 00:30.0;  Surgeon: Myrna Adine Anes, MD;  Location: [facility];  Service: Ophthalmology;  Laterality: Right;   CERVICAL CONE BIOPSY     COLONOSCOPY     CYSTOSCOPY WITH BIOPSY N/A 05/05/2021   Procedure: CYSTOSCOPY WITH BLADDER BIOPSY;  Surgeon: Penne Knee, MD;  Location: [facility] OR[facility];  Service: Urology;  Laterality: N/A;   DILATION AND CURETTAGE OF UTERUS     TUBAL LIGATION      Gynecologic History:  No LMP recorded. Patient has had a hysterectomy. Last Pap: Results were: 2015  Last mammogram: 04/2024 Results were: BI-RAD I  Obstetric History: [id number]  Family History:  Family History  Problem Relation Age of Onset   Diabetes Mother    Heart attack Mother    COPD Father    Pulmonary embolism Father    Hyperlipidemia Sister    Hyperlipidemia Daughter    Hyperlipidemia Brother    Kidney disease Brother    Kidney Stones Brother    Kidney cancer Neg Hx    Prostate cancer Neg Hx    Colon  cancer Neg Hx    Colon polyps Neg Hx    Esophageal cancer Neg Hx    Rectal cancer Neg Hx    Stomach cancer Neg Hx    Breast cancer Neg Hx     Social History:  Social History   Socioeconomic History   Marital status: Married    Spouse name: Not on file   Number of children: Not on file   Years of education: Not on file   Highest education level: Not on file  Occupational History   Not on file  Tobacco Use   Smoking status: Former    Current packs/day: 0.00    Average packs/day: 0.5 packs/day for 20.0 years (10.0 ttl pk-yrs)     Types: Cigarettes    Start date: 08/03/1970    Quit date: 08/03/1990    Years since quitting: 33.7   Smokeless tobacco: Never  Vaping Use   Vaping status: Never Used  Substance and Sexual Activity   Alcohol use: Yes    Alcohol/week: 0.0 standard drinks of alcohol    Comment: 2 per month   Drug use: No   Sexual activity: Yes    Birth control/protection: Surgical  Other Topics Concern   Not on file  Social History Narrative   Not on file   Social Drivers of Health   Financial Resource Strain: Low Risk  (04/25/2024)   Received from [facility]   Overall Financial Resource Strain (CARDIA)    Difficulty of Paying Living Expenses: Not hard at all  Food Insecurity: No Food Insecurity (04/25/2024)   Received from [facility]   Hunger Vital Sign    Within the past 12 months, you worried that your food would run out before you got the money to buy more.: Never true    Within the past 12 months, the food you bought just didn't last and you didn't have money to get more.: Never true  Transportation Needs: No Transportation Needs (04/25/2024)   Received from [facility] - Transportation    In the past 12 months, has lack of transportation kept you from medical appointments or from getting medications?: No    Lack of Transportation (Non-Medical): No  Physical Activity: Insufficiently Active (11/10/2021)   Exercise Vital Sign    Days of Exercise per Week: 3 days    Minutes of Exercise per Session: 40 min  Stress: Stress Concern Present (11/10/2021)   [employer] of Occupational Health - Occupational Stress Questionnaire    Feeling of Stress : Very much  Social Connections: Socially Integrated (11/10/2021)   Social Connection and Isolation Panel    Frequency of Communication with Friends and Family: More than three times a week    Frequency of Social Gatherings with Friends and Family: More than three times a week    Attends  Religious Services: 1 to 4 times per year    Active Member of [employer] or Organizations: Yes    Attends Banker Meetings: 1 to 4 times per year    Marital Status: Married  Catering manager Violence: Not At Risk (11/10/2021)   Humiliation, Afraid, Rape, and Kick questionnaire    Fear of Current or Ex-Partner: No    Emotionally Abused: No    Physically Abused: No    Sexually Abused: No    Allergies:  Allergies  Allergen Reactions   Bee Venom Anaphylaxis   Iodinated Contrast Media Anaphylaxis   Iohexol Anaphylaxis  Desc: had severe reaction in october '05   swelling, sob and throat closing    Cefaclor    Egg-Derived Products Nausea And Vomiting   Doxycycline Rash   Latex Rash    Medications: Prior to Admission medications   Medication Sig Start Date End Date Taking? Authorizing Provider  B-D 3CC LUER-LOK SYR 25GX5/8 25G X 5/8 3 ML MISC USE AS DIRECTED 11/17/21  Yes Bertrum Charlie CROME, MD  BD DISP NEEDLES 25G X 5/8 MISC USE AS DIRECTED WITH B12 SHOTS 10/17/18  Yes [provider]  cyanocobalamin  (VITAMIN B12) 1000 MCG/ML injection INJECT 1 ML INTO THE SKIN EVERY 6 DAYS 07/17/22  Yes Gasper Nancyann BRAVO, MD  diphenhydrAMINE  (BENADRYL ) 50 MG tablet Pt is also to take 50 mg of benadryl  on 01/24/2024 @ 1:20 pm. Please call [phone] with any questions. 01/17/24  Yes Karalee Wilkie POUR, MD  EPINEPHrine  0.3 mg/0.3 mL IJ SOAJ injection INJECT INTRAMUSCULARLY AS DIRECTED 06/02/22  Yes Gasper Nancyann BRAVO, MD  erythromycin  ophthalmic ointment Apply to sutures 4 times a day for 10-12 days.  Discontinue if allergy develops and call our office 08/20/23  Yes Ashley Greig HERO, MD  estradiol  (VIVELLE -DOT) 0.0375 MG/24HR Place 1 patch onto the skin 2 (two) times a week. 05/08/24  Yes Marisela Line, Jinnie Jansky, CNM  Evolocumab 140 MG/ML SOAJ Inject 140 mg into the skin. 04/24/24  Yes [provider]  predniSONE  (DELTASONE ) 50 MG tablet Pt to take 50 mg of prednisone  on 01/24/2024 @  1:20 am, 50 mg of prednisone  on 01/24/2024 at 7:20 am, and 50 mg of prednisone  on 01/24/2024 @ at 1:20 pm. Pt is also to take 50 mg of benadryl  on 01/24/2024 at 1:20 pm. Please call [phone] with any questions. 01/17/24  Yes Karalee Wilkie POUR, MD  traMADol  (ULTRAM ) 50 MG tablet Take 1 every 4-6 hours as needed for pain not controlled by Tylenol  08/20/23  Yes Ashley Greig HERO, MD  VIACTIV CALCIUM PLUS D 650-12.5-40 MG-MCG CHEW  09/10/21  Yes [provider]  Vitamin D , Ergocalciferol , (DRISDOL ) 1.25 MG (50000 UNIT) CAPS capsule TAKE 1 CAPSULE BY MOUTH 1 TIME A WEEK 09/21/22  Yes Cyndi Shaver, PA-C    Physical Exam Vitals: Blood pressure (!) 153/76, pulse 69, height 5' 7 (1.702 m), weight 132 lb 6.4 oz (60.1 kg).  General: NAD HEENT: normocephalic, anicteric Thyroid : no enlargement, no palpable nodules Pulmonary: No increased work of breathing, CTAB Cardiovascular: RRR, distal pulses 2+ Breast: declined exam  Abdomen: NABS, soft, non-tender, non-distended.  Umbilicus without lesions.  No hepatomegaly, splenomegaly or masses palpable. No evidence of hernia  Genitourinary:  External: Normal but atrophic external female genitalia.  Normal urethral meatus, normal Bartholin's and Skene's glands.    Vagina: Normal atrophic vaginal mucosa, no evidence of prolapse.    Cervix: spec exam not done, unsure about presence of cervix   Uterus: surgically absent  Adnexa: surgically absent  Rectal: deferred  Lymphatic: no evidence of inguinal lymphadenopathy Extremities: no edema, erythema, or tenderness Neurologic: Grossly intact Psychiatric: mood appropriate, affect full  Assessment: 73 y.o. G2P1011 routine annual exam  Plan: Problem List Items Addressed This Visit   None Visit Diagnoses       Well woman exam    -  Primary     Menopausal vasomotor syndrome       Relevant Medications   estradiol  (VIVELLE -DOT) 0.0375 MG/24HR       1) Mammogram - recommend yearly screening mammogram.   Mammogram Is up to date  2) STI screening  wasoffered and declined  3) ASCCP guidelines and rational discussed.  Patient opts for discontinue age >77 screening interval  4) Osteoporosis  - per USPTF routine screening DEXA at age 68 DEXA scan done  in 2018   Consider FDA-approved medical therapies in postmenopausal women and men aged 21 years and older, based on the following: a) A hip or vertebral (clinical or morphometric) fracture b) T-score <= -2.5 at the femoral neck or spine after appropriate evaluation to exclude secondary causes C) Low bone mass (T-score between -1.0 and -2.5 at the femoral neck or spine) and a 10-year probability of a hip fracture >= 3% or a 10-year probability of a major osteoporosis-related fracture >= 20% based on the [place] -adapted WHO algorithm   5) Routine healthcare maintenance including cholesterol, diabetes screening discussed managed by PCP  6) Colonoscopy last done in 2019, ordered by PCP .  Screening recommended starting at age 34 for average risk individuals, age 61 for individuals deemed at increased risk (including African Americans) and recommended to continue until age 77.  For patient age 47-85 individualized approach is recommended.  Gold standard screening is via colonoscopy, Cologuard screening is an acceptable alternative for patient unwilling or unable to undergo colonoscopy.  Colorectal cancer screening for average?risk adults: 2018 guideline update from the American Cancer SocietyCA: A Cancer Journal for Clinicians: Dec 30, 2016   7) Estrogen therapy: discussed weaning or discontinuing all together. Leinani would like to remain on estrogen for as long she is sexually active. She is willing to decrease the dose, new script sent in. Pt may call if she she is not happy with the new dose after a few months of the new dose.    Jinnie Cookey, CNM  [facility] 05/08/2024, 5:26 PM

## 2024-05-11 ENCOUNTER — Telehealth: Payer: Self-pay

## 2024-05-11 NOTE — Telephone Encounter (Signed)
 PA created & submitted

## 2024-05-19 ENCOUNTER — Other Ambulatory Visit: Payer: Self-pay | Admitting: Licensed Practical Nurse

## 2024-05-19 DIAGNOSIS — N951 Menopausal and female climacteric states: Secondary | ICD-10-CM

## 2024-06-10 ENCOUNTER — Other Ambulatory Visit: Payer: Self-pay | Admitting: Obstetrics and Gynecology

## 2024-06-10 DIAGNOSIS — N941 Unspecified dyspareunia: Secondary | ICD-10-CM

## 2024-06-10 DIAGNOSIS — N952 Postmenopausal atrophic vaginitis: Secondary | ICD-10-CM

## 2024-06-13 NOTE — Progress Notes (Unsigned)
 07/22/2018 3:26 PM   Lonny LITTIE Dec 26-Oct-1950 [id number]  Referring provider: Bertrum Charlie LITTIE, MD [address] [place],  [place] [address]   Urological history: 1. High risk hematuria -former smoker -CTU 2018 NED -cysto 2018 NED -cysto 2022 - Whitish heaped up area, approximately 1 cm in which there is an embedded what looks like calcification or thrombosed vessel in the central portion of the trigone -bladder biopsy 2022 - UROTHELIUM WITH SQUAMOUS METAPLASIA, CYSTITIS CYSTICA, AND FOCAL AREA OF NON-SPECIFIC STROMAL HEMORRHAGE. - NEGATIVE FOR DYSPLASIA AND MALIGNANCY -seen by nephrology-no recommendations  Chief Complaint  Patient presents with   Follow-up    HPI: KIANNI LHEUREUX is a 73 y.o. female who presents today for yearly follow up.   Previous records reviewed.   She is having 1 to 7 daytime voids, 1-2 nocturia, with a mild urge to urinate.  She has no urinary leakage.  She wears panty liners on occasion.  She does not limit fluid intake and she does not engage in toilet mapping.  Patient denies any modifying or aggravating factors.  Patient denies any recent UTI's, gross hematuria, dysuria or suprapubic/flank pain.  Patient denies any fevers, chills, nausea or vomiting.    UA yellow clear, specific area less than 1.005, pH 6.5, 05 WBCs, no RBCs, 0-10 epithelial cells and a few bacteria  PMH: Past Medical History:  Diagnosis Date   B12 deficiency    Endometriosis 04/2015   Hyperlipidemia    Postmenopausal     Surgical History: Past Surgical History:  Procedure Laterality Date   ABDOMINAL HYSTERECTOMY     BROW LIFT Bilateral 08/20/2023   Procedure: BLEPHAROPLASTY UPPER EYELID; W/EXCESS SKIN BILATERAL;  Surgeon: Ashley Greig HERO, MD;  Location: [facility];  Service: Ophthalmology;  Laterality: Bilateral;   CATARACT EXTRACTION W/PHACO Left 09/29/2021   Procedure: CATARACT EXTRACTION PHACO AND INTRAOCULAR LENS PLACEMENT (IOC) LEFT PANOPTIX  TORIC;  Surgeon: Myrna Adine Anes, MD;  Location: [facility];  Service: Ophthalmology;  Laterality: Left;  1.85 0:21.7   CATARACT EXTRACTION W/PHACO Right 10/13/2021   Procedure: CATARACT EXTRACTION PHACO AND INTRAOCULAR LENS PLACEMENT (IOC) RIGHT PANOPTIX TORIC 2.50 00:30.0;  Surgeon: Myrna Adine Anes, MD;  Location: [facility];  Service: Ophthalmology;  Laterality: Right;   CERVICAL CONE BIOPSY     COLONOSCOPY     CYSTOSCOPY WITH BIOPSY N/A 05/05/2021   Procedure: CYSTOSCOPY WITH BLADDER BIOPSY;  Surgeon: Penne Knee, MD;  Location: [facility];  Service: Urology;  Laterality: N/A;   DILATION AND CURETTAGE OF UTERUS     TUBAL LIGATION      Home Medications:  Allergies as of 06/15/2024       Reactions   Bee Venom Anaphylaxis   Iodinated Contrast Media Anaphylaxis   Iohexol Anaphylaxis    Desc: had severe reaction in october '05   swelling, sob and throat closing   Cefaclor    Egg Protein-containing Drug Products Nausea And Vomiting   Doxycycline Rash   Latex Rash        Medication List        Accurate as of June 15, 2024  3:26 PM. If you have any questions, ask your nurse or doctor.          STOP taking these medications    diphenhydrAMINE  50 MG tablet Commonly known as: BENADRYL  Stopped by: CLOTILDA Hussam Muniz   predniSONE  50 MG tablet Commonly known as: DELTASONE  Stopped by: CLOTILDA CORNWALL       TAKE  these medications    B-D 3CC LUER-LOK SYR 25GX5/8 25G X 5/8 3 ML Misc Generic drug: SYRINGE-NEEDLE (DISP) 3 ML USE AS DIRECTED   BD Disp Needles 25G X 5/8 Misc Generic drug: NEEDLE (DISP) 25 G USE AS DIRECTED WITH B12 SHOTS   cyanocobalamin  1000 MCG/ML injection Commonly known as: VITAMIN B12 INJECT 1 ML INTO THE SKIN EVERY 6 DAYS   EPINEPHrine  0.3 mg/0.3 mL Soaj injection Commonly known as: EPI-PEN INJECT INTRAMUSCULARLY AS DIRECTED   erythromycin  ophthalmic ointment Apply to sutures 4 times a day for 10-12 days.   Discontinue if allergy develops and call our office   estradiol  0.0375 MG/24HR Commonly known as: VIVELLE -DOT Place 1 patch onto the skin 2 (two) times a week.   Evolocumab 140 MG/ML Soaj Inject 140 mg into the skin.   traMADol  50 MG tablet Commonly known as: Ultram  Take 1 every 4-6 hours as needed for pain not controlled by Tylenol    Viactiv Calcium Plus D 650-12.5-40 MG-MCG Chew Generic drug: Calcium-Vitamin D -Vitamin K   Vitamin D  (Ergocalciferol ) 1.25 MG (50000 UNIT) Caps capsule Commonly known as: DRISDOL  TAKE 1 CAPSULE BY MOUTH 1 TIME A WEEK        Allergies:  Allergies  Allergen Reactions   Bee Venom Anaphylaxis   Iodinated Contrast Media Anaphylaxis   Iohexol Anaphylaxis     Desc: had severe reaction in october '05   swelling, sob and throat closing    Cefaclor    Egg Protein-Containing Drug Products Nausea And Vomiting   Doxycycline Rash   Latex Rash    Family History: Family History  Problem Relation Age of Onset   Diabetes Mother    Heart attack Mother    COPD Father    Pulmonary embolism Father    Hyperlipidemia Sister    Hyperlipidemia Daughter    Hyperlipidemia Brother    Kidney disease Brother    Kidney Stones Brother    Kidney cancer Neg Hx    Prostate cancer Neg Hx    Colon cancer Neg Hx    Colon polyps Neg Hx    Esophageal cancer Neg Hx    Rectal cancer Neg Hx    Stomach cancer Neg Hx    Breast cancer Neg Hx     Social History:  reports that she quit smoking about 33 years ago. Her smoking use included cigarettes. She started smoking about 53 years ago. She has a 10 pack-year smoking history. She has never used smokeless tobacco. She reports current alcohol use. She reports that she does not use drugs.  ROS: For pertinent review of systems please refer to history of present illness   Physical Exam: BP (!) 151/66   Pulse 69   Ht 5' 7 (1.702 m)   Wt 131 lb (59.4 kg)   BMI 20.52 kg/m   Constitutional:  Well nourished. Alert and  oriented, No acute distress. HEENT: [place] AT, moist mucus membranes.  Trachea midline Cardiovascular: No clubbing, cyanosis, or edema. Respiratory: Normal respiratory effort, no increased work of breathing. Neurologic: Grossly intact, no focal deficits, moving all 4 extremities. Psychiatric: Normal mood and affect.    Laboratory Data: See EPIC and HPI  I have reviewed the labs.    Pertinent imaging N/A  Assessment & Plan:   1. High risk hematuria -former smoker -work up 2018 - NED -cysto (2022) - cystitis cystica -no reports of gross heme -UA negative for micro heme -She would like to continue annual follow-ups  Return in about 1  year (around 06/15/2025) for UA, OAB questionnaire .  These notes generated with voice recognition software. I apologize for typographical errors.  CLOTILDA CORNWALL, PA-C  [facility] [address] [place], [place] [address] [phone]

## 2024-06-15 ENCOUNTER — Encounter: Payer: Self-pay | Admitting: Urology

## 2024-06-15 ENCOUNTER — Ambulatory Visit: Payer: Self-pay | Admitting: Urology

## 2024-06-15 VITALS — BP 151/66 | HR 69 | Ht 67.0 in | Wt 131.0 lb

## 2024-06-15 DIAGNOSIS — R319 Hematuria, unspecified: Secondary | ICD-10-CM | POA: Diagnosis not present

## 2024-06-15 LAB — URINALYSIS, COMPLETE
Bilirubin, UA: NEGATIVE
Glucose, UA: NEGATIVE
Ketones, UA: NEGATIVE
Leukocytes,UA: NEGATIVE
Nitrite, UA: NEGATIVE
Protein,UA: NEGATIVE
RBC, UA: NEGATIVE
Specific Gravity, UA: <1.005 — ABNORMAL LOW (ref 1.005–1.030)
Urobilinogen, Ur: 0.2 mg/dL (ref 0.2–1.0)
pH, UA: 6.5 (ref 5.0–7.5)

## 2024-06-15 LAB — MICROSCOPIC EXAMINATION: RBC, Urine: NONE SEEN /HPF (ref 0–2)

## 2025-06-15 ENCOUNTER — Ambulatory Visit: Admitting: Urology
